# Patient Record
Sex: Male | Born: 1955 | Race: White | Hispanic: No | Smoking: Current every day smoker
Health system: Southern US, Community
[De-identification: ages and names within clinical notes are randomized; demographics above are authoritative.]

## PROBLEM LIST (undated history)

## (undated) DIAGNOSIS — F419 Anxiety disorder, unspecified: Secondary | ICD-10-CM

## (undated) DIAGNOSIS — Z8673 Personal history of transient ischemic attack (TIA), and cerebral infarction without residual deficits: Secondary | ICD-10-CM

## (undated) DIAGNOSIS — F329 Major depressive disorder, single episode, unspecified: Secondary | ICD-10-CM

## (undated) DIAGNOSIS — I2510 Atherosclerotic heart disease of native coronary artery without angina pectoris: Secondary | ICD-10-CM

## (undated) DIAGNOSIS — F259 Schizoaffective disorder, unspecified: Secondary | ICD-10-CM

## (undated) DIAGNOSIS — Z9889 Other specified postprocedural states: Secondary | ICD-10-CM

## (undated) DIAGNOSIS — E119 Type 2 diabetes mellitus without complications: Secondary | ICD-10-CM

## (undated) DIAGNOSIS — I10 Essential (primary) hypertension: Secondary | ICD-10-CM

## (undated) DIAGNOSIS — G40909 Epilepsy, unspecified, not intractable, without status epilepticus: Secondary | ICD-10-CM

## (undated) DIAGNOSIS — I639 Cerebral infarction, unspecified: Secondary | ICD-10-CM

## (undated) DIAGNOSIS — E785 Hyperlipidemia, unspecified: Secondary | ICD-10-CM

## (undated) DIAGNOSIS — G8929 Other chronic pain: Secondary | ICD-10-CM

## (undated) DIAGNOSIS — Z720 Tobacco use: Secondary | ICD-10-CM

## (undated) HISTORY — PX: FEMUR FRACTURE SURGERY: SHX633

## (undated) HISTORY — PX: CARDIAC SURGERY: SHX584

---

## 2014-06-19 ENCOUNTER — Encounter (HOSPITAL_COMMUNITY): Payer: Self-pay | Admitting: Emergency Medicine

## 2014-06-19 ENCOUNTER — Emergency Department (HOSPITAL_COMMUNITY)
Admission: EM | Admit: 2014-06-19 | Discharge: 2014-06-20 | Disposition: A | Discharge status: Home / Self Care | Payer: Medicare Other | Attending: Emergency Medicine | Admitting: Emergency Medicine

## 2014-06-19 ENCOUNTER — Emergency Department (HOSPITAL_COMMUNITY): Payer: Medicare Other

## 2014-06-19 DIAGNOSIS — R519 Headache, unspecified: Secondary | ICD-10-CM

## 2014-06-19 DIAGNOSIS — I2510 Atherosclerotic heart disease of native coronary artery without angina pectoris: Secondary | ICD-10-CM | POA: Diagnosis not present

## 2014-06-19 DIAGNOSIS — I10 Essential (primary) hypertension: Secondary | ICD-10-CM | POA: Insufficient documentation

## 2014-06-19 DIAGNOSIS — R51 Headache: Secondary | ICD-10-CM | POA: Insufficient documentation

## 2014-06-19 DIAGNOSIS — Z79899 Other long term (current) drug therapy: Secondary | ICD-10-CM | POA: Diagnosis not present

## 2014-06-19 DIAGNOSIS — K121 Other forms of stomatitis: Secondary | ICD-10-CM | POA: Insufficient documentation

## 2014-06-19 DIAGNOSIS — Z8673 Personal history of transient ischemic attack (TIA), and cerebral infarction without residual deficits: Secondary | ICD-10-CM | POA: Insufficient documentation

## 2014-06-19 DIAGNOSIS — E119 Type 2 diabetes mellitus without complications: Secondary | ICD-10-CM | POA: Diagnosis not present

## 2014-06-19 DIAGNOSIS — Z9889 Other specified postprocedural states: Secondary | ICD-10-CM | POA: Diagnosis not present

## 2014-06-19 DIAGNOSIS — Z791 Long term (current) use of non-steroidal anti-inflammatories (NSAID): Secondary | ICD-10-CM | POA: Diagnosis not present

## 2014-06-19 DIAGNOSIS — Z720 Tobacco use: Secondary | ICD-10-CM | POA: Insufficient documentation

## 2014-06-19 DIAGNOSIS — Z7982 Long term (current) use of aspirin: Secondary | ICD-10-CM | POA: Insufficient documentation

## 2014-06-19 HISTORY — DX: Cerebral infarction, unspecified: I63.9

## 2014-06-19 HISTORY — DX: Atherosclerotic heart disease of native coronary artery without angina pectoris: I25.10

## 2014-06-19 HISTORY — DX: Essential (primary) hypertension: I10

## 2014-06-19 HISTORY — DX: Other specified postprocedural states: Z98.890

## 2014-06-19 HISTORY — DX: Type 2 diabetes mellitus without complications: E11.9

## 2014-06-19 LAB — URINALYSIS, ROUTINE W REFLEX MICROSCOPIC
Glucose, UA: NEGATIVE mg/dL
HGB URINE DIPSTICK: NEGATIVE
KETONES UR: NEGATIVE mg/dL
Leukocytes, UA: NEGATIVE
Nitrite: NEGATIVE
Protein, ur: NEGATIVE mg/dL
SPECIFIC GRAVITY, URINE: 1.018 (ref 1.005–1.030)
Urobilinogen, UA: 2 mg/dL — ABNORMAL HIGH (ref 0.0–1.0)
pH: 6.5 (ref 5.0–8.0)

## 2014-06-19 MED ORDER — OXYCODONE-ACETAMINOPHEN 5-325 MG PO TABS
2.0000 | ORAL_TABLET | Freq: Once | ORAL | Status: AC
  Administered 2014-06-19: 2 via ORAL
  Filled 2014-06-19: qty 2

## 2014-06-19 NOTE — ED Notes (Signed)
Pt here via EMS from [facility] with c/o headache - "the worst one i have ever had." Pt has history of previous stroke with left facial droop and left sided weakness at baseline. VSS. NAD noted at this time.

## 2014-06-19 NOTE — ED Provider Notes (Signed)
CSN: [id number]     Arrival date & time 06/19/14  1938 History   First MD Initiated Contact with Patient 06/19/14 1940     Chief Complaint  Patient presents with  . Headache     (Consider location/radiation/quality/duration/timing/severity/associated sxs/prior Treatment) HPI The patient ports that he has developed a headache that feels like he has a big pressure right behind his nose and eyes. He reports his pain on the right side of his face. He reports that the headache started this afternoon around lunch time and the pain is much worse and he is experienced with prior headaches. He reports it has both a burning and a pressure quality to it. He reports that he's had bloody mucousy drainage that he thought was from his sinuses. He reports that comes out underneath his denture. He denies any fever or neck stiffness. The patient has a prior history of stroke with a pre-existing left-sided deficit. He denies he's had any new weakness, numbness, tingling or dysfunction. He has not had associated nausea or vomiting. Past Medical History  Diagnosis Date  . Stroke   . Coronary artery disease   . History of cardiac cath   . Diabetes mellitus without complication   . Hypertension    Past Surgical History  Procedure Laterality Date  . Cardiac surgery    . Femur fracture surgery     History reviewed. No pertinent family history. History  Substance Use Topics  . Smoking status: Current Every Day Smoker -- 0.50 packs/day    Types: Cigarettes  . Smokeless tobacco: Never Used  . Alcohol Use: No    Review of Systems 10 Systems reviewed and are negative for acute change except as noted in the HPI.    Allergies  Review of patient's allergies indicates no known allergies.  Home Medications   Prior to Admission medications   Medication Sig Start Date End Date Taking? Authorizing Provider  amLODipine (NORVASC) 5 MG tablet Take 10 mg by mouth daily.   Yes Historical Provider, MD  aspirin 81  MG chewable tablet Chew 81 mg by mouth daily.   Yes Historical Provider, MD  atenolol (TENORMIN) 50 MG tablet Take 50 mg by mouth 2 (two) times daily.   Yes Historical Provider, MD  atorvastatin (LIPITOR) 20 MG tablet Take 20 mg by mouth daily.   Yes Historical Provider, MD  baclofen (LIORESAL) 10 MG tablet Take 10 mg by mouth 3 (three) times daily.   Yes Historical Provider, MD  benazepril (LOTENSIN) 40 MG tablet Take 40 mg by mouth daily.   Yes Historical Provider, MD  diazepam (VALIUM) 5 MG tablet Take 5 mg by mouth 3 (three) times daily.   Yes Historical Provider, MD  diclofenac sodium (VOLTAREN) 1 % GEL Apply 2 g topically 4 (four) times daily. Apply to hip   Yes Historical Provider, MD  DULoxetine (CYMBALTA) 60 MG capsule Take 60 mg by mouth daily.   Yes Historical Provider, MD  etodolac (LODINE) 300 MG capsule Take 300 mg by mouth 3 (three) times daily.   Yes Historical Provider, MD  febuxostat (ULORIC) 40 MG tablet Take 40 mg by mouth daily.   Yes Historical Provider, MD  fenofibrate 160 MG tablet Take 160 mg by mouth daily.   Yes Historical Provider, MD  furosemide (LASIX) 20 MG tablet Take 20 mg by mouth daily.   Yes Historical Provider, MD  gabapentin (NEURONTIN) 800 MG tablet Take 800 mg by mouth 3 (three) times daily.   Yes Historical  Provider, MD  HYDROcodone-acetaminophen (NORCO/VICODIN) 5-325 MG per tablet Take 1 tablet by mouth every 6 (six) hours as needed for moderate pain.   Yes Historical Provider, MD  hydrOXYzine (ATARAX/VISTARIL) 50 MG tablet Take 50 mg by mouth every 6 (six) hours as needed for anxiety.   Yes Historical Provider, MD  lamoTRIgine (LAMICTAL) 25 MG tablet Take 25 mg by mouth every other day.   Yes Historical Provider, MD  levETIRAcetam (KEPPRA) 500 MG tablet Take 1,000 mg by mouth 2 (two) times daily.   Yes Historical Provider, MD  lidocaine (LIDODERM) 5 % Place 1 patch onto the skin daily. Remove & Discard patch within 12 hours or as directed by MD   Yes  Historical Provider, MD  loperamide (IMODIUM A-D) 2 MG tablet Take 2 mg by mouth 4 (four) times daily as needed for diarrhea or loose stools.   Yes Historical Provider, MD  metFORMIN (GLUCOPHAGE) 1000 MG tablet Take 1,000 mg by mouth 2 (two) times daily with a meal.   Yes Historical Provider, MD  nicotine (NICODERM CQ - DOSED IN MG/24 HOURS) 21 mg/24hr patch Place 21 mg onto the skin daily.   Yes Historical Provider, MD  nitroGLYCERIN (NITROSTAT) 0.4 MG SL tablet Place 0.4 mg under the tongue every 5 (five) minutes as needed for chest pain.   Yes Historical Provider, MD  omeprazole (PRILOSEC) 20 MG capsule Take 20 mg by mouth daily.   Yes Historical Provider, MD  polyethylene glycol (MIRALAX / GLYCOLAX) packet Take 17 g by mouth daily.   Yes Historical Provider, MD  QUEtiapine (SEROQUEL) 25 MG tablet Take 25 mg by mouth at bedtime.   Yes Historical Provider, MD  oxyCODONE-acetaminophen (PERCOCET/ROXICET) 5-325 MG per tablet Take 2 tablets by mouth every 6 (six) hours as needed for severe pain. 06/20/14   Arby Barrette, MD   BP 145/89 mmHg  Pulse 68  Resp 17  Ht  (1.727 m)  Wt 250 lb (113.399 kg)  BMI 38.02 kg/m2  SpO2 88% Physical Exam  Constitutional: He is oriented to person, place, and time. He appears well-developed and well-nourished.  The patient is sitting on the edge of the stretcher. His general appearance is well. He is alert and shows no respiratory distress. His color is good and he is giving full history.  HENT:  Head: Normocephalic and atraumatic.  Right Ear: External ear normal.  Left Ear: External ear normal.  Nose: Nose normal.  Mouth/Throat: Oropharynx is clear and moist.  Patient did remove his dentures and has an erosion in the pocket of the upper lip just beneath his nose. This is approximately 4 x 3 mm. There is no active drainage or discharge. It does not have a depth of more than several millimeters. I don't see any indication that this has any fistular  connection with his nasal cavity.  Eyes: EOM are normal. Pupils are equal, round, and reactive to light.  Neck: Neck supple.  Cardiovascular: Normal rate, regular rhythm, normal heart sounds and intact distal pulses.   Pulmonary/Chest: Effort normal and breath sounds normal.  Abdominal: Soft. Bowel sounds are normal. He exhibits no distension. There is no tenderness.  Musculoskeletal: Normal range of motion. He exhibits no edema.  Neurological: He is alert and oriented to person, place, and time. He has normal strength. GCS eye subscore is 4. GCS verbal subscore is 5. GCS motor subscore is 6.  At baseline the patient is wheel chair bound for left lower extremity dysfunction. There is some  left facial droop however the patient has good speech production, no drooling or difficulty handling secretions. He follows commands appropriately and is able to reposition himself from a seated position the stretcher to supine. He reports all of this is at his baseline.  Skin: Skin is warm, dry and intact.  Psychiatric: He has a normal mood and affect.    ED Course  Procedures (including critical care time) Labs Review Labs Reviewed  URINALYSIS, ROUTINE W REFLEX MICROSCOPIC - Abnormal; Notable for the following:    Bilirubin Urine LARGE (*)    Urobilinogen, UA 2.0 (*)    All other components within normal limits    Imaging Review Ct Head Wo Contrast  06/19/2014   CLINICAL DATA:  Severe right-sided headache worsening throughout the day.  EXAM: CT HEAD WITHOUT CONTRAST  TECHNIQUE: Contiguous axial images were obtained from the base of the skull through the vertex without intravenous contrast.  COMPARISON:  None.  FINDINGS: Encephalomalacia in the high parasagittal right frontal lobe is consistent with a chronic ACA territory infarct. There is ex vacuo dilatation of the right lateral ventricle. Periventricular white-matter hypodensities are nonspecific but compatible with mild chronic small vessel ischemic  disease. There is no evidence of acute cortical infarct, intracranial hemorrhage, mass, midline shift, or extra-axial fluid collection.  Orbits are unremarkable. Mastoid air cells and paranasal sinuses are clear. Mild carotid siphon calcification is noted.  IMPRESSION: 1. No evidence of acute intracranial abnormality. 2. 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   Electronically Signed   By: Sebastian AcheAllen  Grady   On: 06/19/2014 21:13     EKG Interpretation None     With the patient's report of his worst ever headache and headache being right side behind his eye and nose, I had concern for possible aneurysm and felt that MRI/MRA was indicated for the patient. Upon reassessment the patient felt that his headache was improved and that his pain was due to this ulcer area in his gum and a infection in his sinus. He did not feel that he needed a MRI and requested antibiotics and pain medications. I explained to the patient that based on my physical exam findings I do not see indication of acute infection. The area in question in his oral cavity is a shallow self-contained ulcer consistent with chronic rubbing from his denture plate. There is no extending erythema or swelling about the area. There is no abnormal examination findings within the nasal passage, the remainder of the oral cavity nor any associated facial swelling. The patient however did not feel that a MRI was needed and explained that he would prefer to do a follow-up with his family physician and subsequently [facility]. MDM   Final diagnoses:  Acute nonintractable headache, unspecified headache type  Oral ulcer   See above note. Patient has not had any change in his baseline neurologic function. He is alert and interactive. His cognitive function is intact with good recall and insight into his current situation.    Arby BarretteMarcy Avant Printy, MD 06/20/14 [phone]

## 2014-06-20 ENCOUNTER — Telehealth: Payer: Self-pay | Admitting: *Deleted

## 2014-06-20 MED ORDER — OXYCODONE-ACETAMINOPHEN 5-325 MG PO TABS: 2.0000 | ORAL_TABLET | Freq: Four times a day (QID) | ORAL | Status: DC | PRN

## 2014-06-20 NOTE — Discharge Instructions (Signed)
°  General Headache Without Cause A headache is pain or discomfort felt around the head or neck area. The specific cause of a headache may not be found. There are many causes and types of headaches. A few common ones are:  Tension headaches.  Migraine headaches.  Cluster headaches.  Chronic daily headaches. HOME CARE INSTRUCTIONS   Keep all follow-up appointments with your caregiver or any specialist referral.  Only take over-the-counter or prescription medicines for pain or discomfort as directed by your caregiver.  Lie down in a dark, quiet room when you have a headache.  Keep a headache journal to find out what may trigger your migraine headaches. For example, write down:  What you eat and drink.  How much sleep you get.  Any change to your diet or medicines.  Try massage or other relaxation techniques.  Put ice packs or heat on the head and neck. Use these 3 to 4 times per day for 15 to 20 minutes each time, or as needed.  Limit stress.  Sit up straight, and do not tense your muscles.  Quit smoking if you smoke.  Limit alcohol use.  Decrease the amount of caffeine you drink, or stop drinking caffeine.  Eat and sleep on a regular schedule.  Get 7 to 9 hours of sleep, or as recommended by your caregiver.  Keep lights dim if bright lights bother you and make your headaches worse. SEEK MEDICAL CARE IF:   You have problems with the medicines you were prescribed.  Your medicines are not working.  You have a change from the usual headache.  You have nausea or vomiting. SEEK IMMEDIATE MEDICAL CARE IF:   Your headache becomes severe.  You have a fever.  You have a stiff neck.  You have loss of vision.  You have muscular weakness or loss of muscle control.  You start losing your balance or have trouble walking.  You feel faint or pass out.  You have severe symptoms that are different from your first symptoms. MAKE SURE YOU:   Understand these  instructions.  Will watch your condition.  Will get help right away if you are not doing well or get worse. Document Released: 04/13/2005 Document Revised: 07/06/2011 Document Reviewed: 04/29/2011 [facility] Patient Information 2015 [employer] [place]. This information is not intended to replace advice given to you by your health care provider. Make sure you discuss any questions you have with your health care provider.  You have an ulcer under your denture plate. See your dentist as soon as possible for a refitting. Remove her dentures as much as possible to allow the area to heal.

## 2014-06-20 NOTE — ED Notes (Signed)
[facility] contacted [facility]

## 2014-06-20 NOTE — Telephone Encounter (Signed)
Pharmacy called stating that pt had Norco prescription filled on the 8th and has refills.  NCM advised not to fill      oxyCODONE-acetaminophen (PERCOCET/ROXICET) 5-325 MG per tablet     As prescribed by us.

## 2014-08-08 ENCOUNTER — Inpatient Hospital Stay (HOSPITAL_COMMUNITY): Payer: Medicare Other

## 2014-08-08 ENCOUNTER — Emergency Department (HOSPITAL_COMMUNITY): Payer: Medicare Other

## 2014-08-08 ENCOUNTER — Inpatient Hospital Stay (HOSPITAL_COMMUNITY)
Admission: EM | Admit: 2014-08-08 | Discharge: 2014-08-13 | DRG: 091 | Disposition: A | Discharge status: Skilled Nursing Facility | Payer: Medicare Other | Attending: Internal Medicine | Admitting: Internal Medicine

## 2014-08-08 ENCOUNTER — Encounter (HOSPITAL_COMMUNITY): Payer: Self-pay

## 2014-08-08 DIAGNOSIS — I959 Hypotension, unspecified: Secondary | ICD-10-CM | POA: Diagnosis present

## 2014-08-08 DIAGNOSIS — T40605A Adverse effect of unspecified narcotics, initial encounter: Secondary | ICD-10-CM | POA: Diagnosis present

## 2014-08-08 DIAGNOSIS — K219 Gastro-esophageal reflux disease without esophagitis: Secondary | ICD-10-CM | POA: Diagnosis present

## 2014-08-08 DIAGNOSIS — Z4659 Encounter for fitting and adjustment of other gastrointestinal appliance and device: Secondary | ICD-10-CM

## 2014-08-08 DIAGNOSIS — F17210 Nicotine dependence, cigarettes, uncomplicated: Secondary | ICD-10-CM | POA: Diagnosis present

## 2014-08-08 DIAGNOSIS — E119 Type 2 diabetes mellitus without complications: Secondary | ICD-10-CM | POA: Diagnosis present

## 2014-08-08 DIAGNOSIS — J9601 Acute respiratory failure with hypoxia: Secondary | ICD-10-CM | POA: Diagnosis present

## 2014-08-08 DIAGNOSIS — G894 Chronic pain syndrome: Secondary | ICD-10-CM | POA: Diagnosis present

## 2014-08-08 DIAGNOSIS — I639 Cerebral infarction, unspecified: Secondary | ICD-10-CM

## 2014-08-08 DIAGNOSIS — K7200 Acute and subacute hepatic failure without coma: Secondary | ICD-10-CM | POA: Diagnosis present

## 2014-08-08 DIAGNOSIS — I10 Essential (primary) hypertension: Secondary | ICD-10-CM | POA: Diagnosis present

## 2014-08-08 DIAGNOSIS — G40909 Epilepsy, unspecified, not intractable, without status epilepticus: Secondary | ICD-10-CM | POA: Diagnosis present

## 2014-08-08 DIAGNOSIS — E785 Hyperlipidemia, unspecified: Secondary | ICD-10-CM | POA: Diagnosis present

## 2014-08-08 DIAGNOSIS — Z7982 Long term (current) use of aspirin: Secondary | ICD-10-CM

## 2014-08-08 DIAGNOSIS — T4275XA Adverse effect of unspecified antiepileptic and sedative-hypnotic drugs, initial encounter: Secondary | ICD-10-CM | POA: Diagnosis present

## 2014-08-08 DIAGNOSIS — J9602 Acute respiratory failure with hypercapnia: Secondary | ICD-10-CM | POA: Diagnosis present

## 2014-08-08 DIAGNOSIS — N289 Disorder of kidney and ureter, unspecified: Secondary | ICD-10-CM | POA: Diagnosis present

## 2014-08-08 DIAGNOSIS — Z79899 Other long term (current) drug therapy: Secondary | ICD-10-CM

## 2014-08-08 DIAGNOSIS — I2510 Atherosclerotic heart disease of native coronary artery without angina pectoris: Secondary | ICD-10-CM | POA: Diagnosis present

## 2014-08-08 DIAGNOSIS — Z6836 Body mass index (BMI) 36.0-36.9, adult: Secondary | ICD-10-CM | POA: Diagnosis not present

## 2014-08-08 DIAGNOSIS — J9690 Respiratory failure, unspecified, unspecified whether with hypoxia or hypercapnia: Secondary | ICD-10-CM

## 2014-08-08 DIAGNOSIS — R401 Stupor: Secondary | ICD-10-CM | POA: Diagnosis not present

## 2014-08-08 DIAGNOSIS — R531 Weakness: Secondary | ICD-10-CM

## 2014-08-08 DIAGNOSIS — J9600 Acute respiratory failure, unspecified whether with hypoxia or hypercapnia: Secondary | ICD-10-CM

## 2014-08-08 DIAGNOSIS — R410 Disorientation, unspecified: Secondary | ICD-10-CM | POA: Diagnosis not present

## 2014-08-08 DIAGNOSIS — G9340 Encephalopathy, unspecified: Secondary | ICD-10-CM | POA: Diagnosis not present

## 2014-08-08 DIAGNOSIS — I69354 Hemiplegia and hemiparesis following cerebral infarction affecting left non-dominant side: Secondary | ICD-10-CM | POA: Diagnosis not present

## 2014-08-08 DIAGNOSIS — G92 Toxic encephalopathy: Principal | ICD-10-CM | POA: Diagnosis present

## 2014-08-08 DIAGNOSIS — I6789 Other cerebrovascular disease: Secondary | ICD-10-CM | POA: Diagnosis not present

## 2014-08-08 DIAGNOSIS — R4182 Altered mental status, unspecified: Secondary | ICD-10-CM | POA: Diagnosis present

## 2014-08-08 DIAGNOSIS — G8190 Hemiplegia, unspecified affecting unspecified side: Secondary | ICD-10-CM

## 2014-08-08 DIAGNOSIS — E872 Acidosis: Secondary | ICD-10-CM | POA: Diagnosis present

## 2014-08-08 DIAGNOSIS — G8194 Hemiplegia, unspecified affecting left nondominant side: Secondary | ICD-10-CM

## 2014-08-08 LAB — I-STAT CHEM 8, ED
BUN: 38 mg/dL — ABNORMAL HIGH (ref 6–23)
CHLORIDE: 98 mmol/L (ref 96–112)
Calcium, Ion: 1.2 mmol/L (ref 1.12–1.23)
Creatinine, Ser: 1.7 mg/dL — ABNORMAL HIGH (ref 0.50–1.35)
GLUCOSE: 112 mg/dL — AB (ref 70–99)
HCT: 44 % (ref 39.0–52.0)
Hemoglobin: 15 g/dL (ref 13.0–17.0)
Potassium: 4.8 mmol/L (ref 3.5–5.1)
Sodium: 135 mmol/L (ref 135–145)
TCO2: 24 mmol/L (ref 0–100)

## 2014-08-08 LAB — URINALYSIS, ROUTINE W REFLEX MICROSCOPIC
Glucose, UA: NEGATIVE mg/dL
HGB URINE DIPSTICK: NEGATIVE
Ketones, ur: NEGATIVE mg/dL
Leukocytes, UA: NEGATIVE
NITRITE: NEGATIVE
PH: 5 (ref 5.0–8.0)
Protein, ur: NEGATIVE mg/dL
Specific Gravity, Urine: 1.015 (ref 1.005–1.030)
Urobilinogen, UA: 0.2 mg/dL (ref 0.0–1.0)

## 2014-08-08 LAB — I-STAT ARTERIAL BLOOD GAS, ED
ACID-BASE DEFICIT: 3 mmol/L — AB (ref 0.0–2.0)
ACID-BASE EXCESS: 1 mmol/L (ref 0.0–2.0)
BICARBONATE: 27.3 meq/L — AB (ref 20.0–24.0)
Bicarbonate: 27.8 mEq/L — ABNORMAL HIGH (ref 20.0–24.0)
O2 SAT: 96 %
O2 Saturation: 100 %
Patient temperature: 98.6
TCO2: 29 mmol/L (ref 0–100)
TCO2: 29 mmol/L (ref 0–100)
pCO2 arterial: 69.6 mmHg (ref 35.0–45.0)
pCO2 arterial: 53.4 mmHg — ABNORMAL HIGH (ref 35.0–45.0)
pH, Arterial: 7.2 — ABNORMAL LOW (ref 7.350–7.450)
pH, Arterial: 7.324 — ABNORMAL LOW (ref 7.350–7.450)
pO2, Arterial: 105 mmHg — ABNORMAL HIGH (ref 80.0–100.0)
pO2, Arterial: 351 mmHg — ABNORMAL HIGH (ref 80.0–100.0)

## 2014-08-08 LAB — COMPREHENSIVE METABOLIC PANEL
ALT: 62 U/L — ABNORMAL HIGH (ref 0–53)
AST: 84 U/L — AB (ref 0–37)
Albumin: 3.9 g/dL (ref 3.5–5.2)
Alkaline Phosphatase: 22 U/L — ABNORMAL LOW (ref 39–117)
Anion gap: 13 (ref 5–15)
BUN: 30 mg/dL — ABNORMAL HIGH (ref 6–23)
CALCIUM: 9.6 mg/dL (ref 8.4–10.5)
CHLORIDE: 97 mmol/L (ref 96–112)
CO2: 24 mmol/L (ref 19–32)
Creatinine, Ser: 1.7 mg/dL — ABNORMAL HIGH (ref 0.50–1.35)
GFR calc Af Amer: 50 mL/min — ABNORMAL LOW (ref >90)
GFR calc non Af Amer: 43 mL/min — ABNORMAL LOW (ref >90)
Glucose, Bld: 114 mg/dL — ABNORMAL HIGH (ref 70–99)
Potassium: 4.9 mmol/L (ref 3.5–5.1)
SODIUM: 134 mmol/L — AB (ref 135–145)
TOTAL PROTEIN: 7 g/dL (ref 6.0–8.3)
Total Bilirubin: 0.8 mg/dL (ref 0.3–1.2)

## 2014-08-08 LAB — CBC
HEMATOCRIT: 39.6 % (ref 39.0–52.0)
HEMOGLOBIN: 13 g/dL (ref 13.0–17.0)
MCH: 28.3 pg (ref 26.0–34.0)
MCHC: 32.8 g/dL (ref 30.0–36.0)
MCV: 86.3 fL (ref 78.0–100.0)
Platelets: 247 10*3/uL (ref 150–400)
RBC: 4.59 MIL/uL (ref 4.22–5.81)
RDW: 13.2 % (ref 11.5–15.5)
WBC: 7.2 10*3/uL (ref 4.0–10.5)

## 2014-08-08 LAB — I-STAT TROPONIN, ED: Troponin i, poc: 0.01 ng/mL (ref 0.00–0.08)

## 2014-08-08 LAB — ETHANOL

## 2014-08-08 LAB — DIFFERENTIAL
BASOS PCT: 0 % (ref 0–1)
Basophils Absolute: 0 10*3/uL (ref 0.0–0.1)
EOS ABS: 0.1 10*3/uL (ref 0.0–0.7)
EOS PCT: 1 % (ref 0–5)
Lymphocytes Relative: 39 % (ref 12–46)
Lymphs Abs: 2.8 10*3/uL (ref 0.7–4.0)
MONOS PCT: 9 % (ref 3–12)
Monocytes Absolute: 0.7 10*3/uL (ref 0.1–1.0)
NEUTROS PCT: 51 % (ref 43–77)
Neutro Abs: 3.6 10*3/uL (ref 1.7–7.7)

## 2014-08-08 LAB — CBG MONITORING, ED: Glucose-Capillary: 114 mg/dL — ABNORMAL HIGH (ref 70–99)

## 2014-08-08 LAB — RAPID URINE DRUG SCREEN, HOSP PERFORMED
AMPHETAMINES: NOT DETECTED
BENZODIAZEPINES: POSITIVE — AB
Barbiturates: NOT DETECTED
COCAINE: NOT DETECTED
OPIATES: POSITIVE — AB
TETRAHYDROCANNABINOL: POSITIVE — AB

## 2014-08-08 LAB — PROTIME-INR
INR: 1.19 (ref 0.00–1.49)
Prothrombin Time: 15.2 seconds (ref 11.6–15.2)

## 2014-08-08 LAB — APTT: aPTT: 30 seconds (ref 24–37)

## 2014-08-08 MED ORDER — FAMOTIDINE IN NACL 20-0.9 MG/50ML-% IV SOLN
20.0000 mg | Freq: Two times a day (BID) | INTRAVENOUS | Status: DC
  Administered 2014-08-09 – 2014-08-11 (×7): 20 mg via INTRAVENOUS
  Filled 2014-08-08 (×9): qty 50

## 2014-08-08 MED ORDER — NALOXONE HCL 0.4 MG/ML IJ SOLN
0.4000 mg | Freq: Once | INTRAMUSCULAR | Status: AC
  Administered 2014-08-08: 0.4 mg via INTRAVENOUS

## 2014-08-08 MED ORDER — NALOXONE HCL 0.4 MG/ML IJ SOLN
INTRAMUSCULAR | Status: AC
  Filled 2014-08-08: qty 1

## 2014-08-08 MED ORDER — INSULIN ASPART 100 UNIT/ML ~~LOC~~ SOLN
0.0000 [IU] | SUBCUTANEOUS | Status: DC
  Administered 2014-08-09 – 2014-08-10 (×3): 2 [IU] via SUBCUTANEOUS

## 2014-08-08 MED ORDER — DEXTROSE-NACL 5-0.45 % IV SOLN
INTRAVENOUS | Status: DC
  Administered 2014-08-09 – 2014-08-11 (×4): via INTRAVENOUS

## 2014-08-08 MED ORDER — SUCCINYLCHOLINE CHLORIDE 20 MG/ML IJ SOLN
INTRAMUSCULAR | Status: DC | PRN
  Administered 2014-08-08: 150 mg via INTRAVENOUS

## 2014-08-08 MED ORDER — LIDOCAINE HCL (CARDIAC) 20 MG/ML IV SOLN
INTRAVENOUS | Status: AC
  Filled 2014-08-08: qty 5

## 2014-08-08 MED ORDER — ETOMIDATE 2 MG/ML IV SOLN
INTRAVENOUS | Status: AC
  Filled 2014-08-08: qty 20

## 2014-08-08 MED ORDER — IOHEXOL 350 MG/ML SOLN: 70.0000 mL | Freq: Once | INTRAVENOUS | Status: DC | PRN

## 2014-08-08 MED ORDER — ASPIRIN 325 MG PO TABS
325.0000 mg | ORAL_TABLET | Freq: Every day | ORAL | Status: DC
  Administered 2014-08-09 – 2014-08-12 (×4): 325 mg
  Filled 2014-08-08 (×5): qty 1

## 2014-08-08 MED ORDER — SUCCINYLCHOLINE CHLORIDE 20 MG/ML IJ SOLN
150.0000 mg | Freq: Once | INTRAMUSCULAR | Status: DC
  Filled 2014-08-08: qty 7.5

## 2014-08-08 MED ORDER — DIAZEPAM 5 MG PO TABS
5.0000 mg | ORAL_TABLET | Freq: Three times a day (TID) | ORAL | Status: DC
  Administered 2014-08-09 – 2014-08-13 (×13): 5 mg via ORAL
  Filled 2014-08-08 (×13): qty 1

## 2014-08-08 MED ORDER — GABAPENTIN 400 MG PO CAPS
400.0000 mg | ORAL_CAPSULE | Freq: Two times a day (BID) | ORAL | Status: DC
  Administered 2014-08-09 (×3): 400 mg
  Filled 2014-08-08 (×5): qty 1

## 2014-08-08 MED ORDER — SUCCINYLCHOLINE CHLORIDE 20 MG/ML IJ SOLN
INTRAMUSCULAR | Status: AC
  Filled 2014-08-08: qty 1

## 2014-08-08 MED ORDER — ETOMIDATE 2 MG/ML IV SOLN: 35.0000 mg | Freq: Once | INTRAVENOUS | Status: DC

## 2014-08-08 MED ORDER — LAMOTRIGINE 25 MG PO TABS
25.0000 mg | ORAL_TABLET | Freq: Every day | ORAL | Status: DC
  Administered 2014-08-09 – 2014-08-12 (×4): 25 mg
  Filled 2014-08-08 (×4): qty 1

## 2014-08-08 MED ORDER — ATORVASTATIN CALCIUM 20 MG PO TABS
20.0000 mg | ORAL_TABLET | Freq: Every day | ORAL | Status: DC
  Administered 2014-08-10 – 2014-08-11 (×2): 20 mg
  Filled 2014-08-08 (×4): qty 1

## 2014-08-08 MED ORDER — POLYETHYLENE GLYCOL 3350 17 G PO PACK
17.0000 g | PACK | Freq: Every day | ORAL | Status: DC | PRN
  Filled 2014-08-08: qty 1

## 2014-08-08 MED ORDER — PROPOFOL 10 MG/ML IV EMUL
INTRAVENOUS | Status: AC
  Filled 2014-08-08: qty 100

## 2014-08-08 MED ORDER — METOPROLOL TARTRATE 1 MG/ML IV SOLN: 2.5000 mg | INTRAVENOUS | Status: DC | PRN

## 2014-08-08 MED ORDER — ROCURONIUM BROMIDE 50 MG/5ML IV SOLN
INTRAVENOUS | Status: AC
  Filled 2014-08-08: qty 2

## 2014-08-08 MED ORDER — FENTANYL CITRATE 0.05 MG/ML IJ SOLN
25.0000 ug | INTRAMUSCULAR | Status: DC | PRN
Start: 2014-08-08 — End: 2014-08-09
  Filled 2014-08-08 (×4): qty 2

## 2014-08-08 MED ORDER — ETOMIDATE 2 MG/ML IV SOLN
INTRAVENOUS | Status: DC | PRN
  Administered 2014-08-08: 35 mg via INTRAVENOUS

## 2014-08-08 MED ORDER — PANTOPRAZOLE SODIUM 40 MG IV SOLR
40.0000 mg | Freq: Every day | INTRAVENOUS | Status: DC
  Administered 2014-08-09 – 2014-08-11 (×4): 40 mg via INTRAVENOUS
  Filled 2014-08-08 (×5): qty 40

## 2014-08-08 MED ORDER — HEPARIN SODIUM (PORCINE) 5000 UNIT/ML IJ SOLN
5000.0000 [IU] | Freq: Three times a day (TID) | INTRAMUSCULAR | Status: DC
  Administered 2014-08-09 – 2014-08-13 (×15): 5000 [IU] via SUBCUTANEOUS
  Filled 2014-08-08 (×18): qty 1

## 2014-08-08 MED ORDER — PROPOFOL 10 MG/ML IV EMUL
5.0000 ug/kg/min | Freq: Once | INTRAVENOUS | Status: AC
  Administered 2014-08-08: 20 ug/kg/min via INTRAVENOUS

## 2014-08-08 MED ORDER — HYDRALAZINE HCL 20 MG/ML IJ SOLN: 10.0000 mg | INTRAMUSCULAR | Status: DC | PRN

## 2014-08-08 MED ORDER — PROPOFOL 10 MG/ML IV EMUL
5.0000 ug/kg/min | INTRAVENOUS | Status: DC
  Administered 2014-08-08 – 2014-08-09 (×2): 15 ug/kg/min via INTRAVENOUS
  Filled 2014-08-08: qty 100

## 2014-08-08 MED ORDER — IPRATROPIUM-ALBUTEROL 0.5-2.5 (3) MG/3ML IN SOLN
3.0000 mL | Freq: Four times a day (QID) | RESPIRATORY_TRACT | Status: DC
  Administered 2014-08-09 – 2014-08-10 (×5): 3 mL via RESPIRATORY_TRACT
  Filled 2014-08-08 (×6): qty 3

## 2014-08-08 MED ORDER — LEVETIRACETAM 100 MG/ML PO SOLN
1000.0000 mg | Freq: Two times a day (BID) | ORAL | Status: DC
  Administered 2014-08-09 (×3): 1000 mg
  Filled 2014-08-08 (×5): qty 10

## 2014-08-08 NOTE — ED Notes (Signed)
To CT at this time.

## 2014-08-08 NOTE — ED Notes (Signed)
Upper dentures removed and placed in denture cup and taken to pt's room with pt

## 2014-08-08 NOTE — ED Notes (Signed)
Resp. Called to assist in transporting pt to CT and 57M

## 2014-08-08 NOTE — ED Provider Notes (Signed)
CSN: [id number]     Arrival date & time 08/08/14  1859 History   First MD Initiated Contact with Patient 08/08/14 1908     Chief Complaint  Patient presents with  . Code Stroke     (Consider location/radiation/quality/duration/timing/severity/associated sxs/prior Treatment) Patient is a 59 y.o. male presenting with Acute Neurological Problem. The history is provided by the EMS personnel. The history is limited by the condition of the patient. No language interpreter was used.  Cerebrovascular Accident This is a new problem. The current episode started today. The problem has been unchanged. Associated symptoms include fatigue, headaches and weakness. Pertinent negatives include no abdominal pain, chest pain, coughing, fever, numbness or vomiting. Nothing aggravates the symptoms. He has tried nothing for the symptoms.    Past Medical History  Diagnosis Date  . Stroke   . Coronary artery disease   . History of cardiac cath   . Diabetes mellitus without complication   . Hypertension    Past Surgical History  Procedure Laterality Date  . Cardiac surgery    . Femur fracture surgery     No family history on file. History  Substance Use Topics  . Smoking status: Current Every Day Smoker -- 0.50 packs/day    Types: Cigarettes  . Smokeless tobacco: Never Used  . Alcohol Use: No    Review of Systems  Constitutional: Positive for fatigue. Negative for fever.  Respiratory: Negative for cough and shortness of breath.   Cardiovascular: Negative for chest pain.  Gastrointestinal: Negative for vomiting and abdominal pain.  Neurological: Positive for weakness and headaches. Negative for facial asymmetry, speech difficulty, light-headedness and numbness.  All other systems reviewed and are negative.     Allergies  Review of patient's allergies indicates no known allergies.  Home Medications   Prior to Admission medications   Medication Sig Start Date End Date Taking? Authorizing  Provider  amLODipine (NORVASC) 5 MG tablet Take 10 mg by mouth daily.    Historical Provider, MD  aspirin 81 MG chewable tablet Chew 81 mg by mouth daily.    Historical Provider, MD  atenolol (TENORMIN) 50 MG tablet Take 50 mg by mouth 2 (two) times daily.    Historical Provider, MD  atorvastatin (LIPITOR) 20 MG tablet Take 20 mg by mouth daily.    Historical Provider, MD  baclofen (LIORESAL) 10 MG tablet Take 10 mg by mouth 3 (three) times daily.    Historical Provider, MD  benazepril (LOTENSIN) 40 MG tablet Take 40 mg by mouth daily.    Historical Provider, MD  diazepam (VALIUM) 5 MG tablet Take 5 mg by mouth 3 (three) times daily.    Historical Provider, MD  diclofenac sodium (VOLTAREN) 1 % GEL Apply 2 g topically 4 (four) times daily. Apply to hip    Historical Provider, MD  DULoxetine (CYMBALTA) 60 MG capsule Take 60 mg by mouth daily.    Historical Provider, MD  etodolac (LODINE) 300 MG capsule Take 300 mg by mouth 3 (three) times daily.    Historical Provider, MD  febuxostat (ULORIC) 40 MG tablet Take 40 mg by mouth daily.    Historical Provider, MD  fenofibrate 160 MG tablet Take 160 mg by mouth daily.    Historical Provider, MD  furosemide (LASIX) 20 MG tablet Take 20 mg by mouth daily.    Historical Provider, MD  gabapentin (NEURONTIN) 800 MG tablet Take 800 mg by mouth 3 (three) times daily.    Historical Provider, MD  HYDROcodone-acetaminophen (NORCO/VICODIN) 5-325  MG per tablet Take 1 tablet by mouth every 6 (six) hours as needed for moderate pain.    Historical Provider, MD  hydrOXYzine (ATARAX/VISTARIL) 50 MG tablet Take 50 mg by mouth every 6 (six) hours as needed for anxiety.    Historical Provider, MD  lamoTRIgine (LAMICTAL) 25 MG tablet Take 25 mg by mouth every other day.    Historical Provider, MD  levETIRAcetam (KEPPRA) 500 MG tablet Take 1,000 mg by mouth 2 (two) times daily.    Historical Provider, MD  lidocaine (LIDODERM) 5 % Place 1 patch onto the skin daily. Remove &  Discard patch within 12 hours or as directed by MD    Historical Provider, MD  loperamide (IMODIUM A-D) 2 MG tablet Take 2 mg by mouth 4 (four) times daily as needed for diarrhea or loose stools.    Historical Provider, MD  metFORMIN (GLUCOPHAGE) 1000 MG tablet Take 1,000 mg by mouth 2 (two) times daily with a meal.    Historical Provider, MD  nicotine (NICODERM CQ - DOSED IN MG/24 HOURS) 21 mg/24hr patch Place 21 mg onto the skin daily.    Historical Provider, MD  nitroGLYCERIN (NITROSTAT) 0.4 MG SL tablet Place 0.4 mg under the tongue every 5 (five) minutes as needed for chest pain.    Historical Provider, MD  omeprazole (PRILOSEC) 20 MG capsule Take 20 mg by mouth daily.    Historical Provider, MD  oxyCODONE-acetaminophen (PERCOCET/ROXICET) 5-325 MG per tablet Take 2 tablets by mouth every 6 (six) hours as needed for severe pain. 06/20/14   Arby Barrette, MD  polyethylene glycol (MIRALAX / GLYCOLAX) packet Take 17 g by mouth daily.    Historical Provider, MD  QUEtiapine (SEROQUEL) 25 MG tablet Take 25 mg by mouth at bedtime.    Historical Provider, MD   Pulse 54  Temp(Src) 98 F (36.7 C) (Oral)  Resp 12  SpO2 97%  ED Triage Vitals  Enc Vitals Group     BP 08/08/14 1931 118/67 mmHg     Pulse Rate 08/08/14 1923 58     Resp 08/08/14 1923 10     Temp 08/08/14 1927 98 F (36.7 C)     Temp Source 08/08/14 1927 Oral     SpO2 08/08/14 1923 88 %     Weight 08/08/14 2244 249 lb 1.9 oz (113 kg)     Height --      Head Cir --      Peak Flow --      Pain Score 08/08/14 1938 2     Pain Loc --      Pain Edu? --      Excl. in GC? --     Physical Exam  Constitutional: He appears well-developed and well-nourished. He appears lethargic. He is easily aroused. He has a sickly appearance. No distress. Nasal cannula in place.  HENT:  Head: Normocephalic and atraumatic.  Nose: Nose normal.  Mouth/Throat: Oropharynx is clear and moist. No oropharyngeal exudate.  Eyes: EOM are normal. Pupils are  equal, round, and reactive to light.  Neck: Normal range of motion. Neck supple.  Cardiovascular: Regular rhythm, normal heart sounds and intact distal pulses.  Bradycardia present.   No murmur heard. Pulmonary/Chest: Effort normal and breath sounds normal. No respiratory distress. He has no wheezes. He exhibits no tenderness.  Abdominal: Soft. He exhibits no distension. There is no tenderness. There is no guarding.  Musculoskeletal: Normal range of motion. He exhibits no tenderness.  Neurological: He is easily aroused.  He appears lethargic. No cranial nerve deficit. Coordination normal.  Fatigued but easily arousable to verbal stimuli.  No facial droop.  Mild dysarthria but understandable.  EOMI.  Pupils equal and reactive.   Decreased strength in left upper < lower extremities.  0/5 in lower extremity, does not react to painful stimuli of that extremity.  Full strength of bilateral right sided extremities.    Skin: Skin is warm and dry. He is not diaphoretic. No pallor.  Nursing note and vitals reviewed.   ED Course  INTUBATION Date/Time: 08/08/2014 8:30 PM Performed by: Lenell AntuWRIGHT, Jasman Murri Authorized by: Lenell AntuWRIGHT, Aurianna Earlywine Consent: The procedure was performed in an emergent situation. Required items: required blood products, implants, devices, and special equipment available Patient identity confirmed: arm band Time out: Immediately prior to procedure a "time out" was called to verify the correct patient, procedure, equipment, support staff and site/side marked as required. Indications: airway protection and  respiratory distress Intubation method: video-assisted (initially attempted DL with a Mac 3, then changed to VL with glidescope) Patient status: paralyzed (RSI) Preoxygenation: BVM (and O2 by [place]) Sedatives: etomidate Paralytic: succinylcholine Laryngoscope size: glidescope size 3. Tube size: 7.5 mm Tube type: cuffed Number of attempts: 2 Ventilation between attempts: BVM Cricoid  pressure: no Cords visualized: yes Post-procedure assessment: chest rise and CO2 detector Breath sounds: equal and absent over the epigastrium Cuff inflated: yes ETT to lip: 23 cm Tube secured with: ETT holder Chest x-ray interpreted by me. Chest x-ray findings: endotracheal tube in appropriate position Patient tolerance: Patient tolerated the procedure well with no immediate complications   (including critical care time) Labs Review Labs Reviewed  I-STAT CHEM 8, ED - Abnormal; Notable for the following:    BUN 38 (*)    Creatinine, Ser 1.70 (*)    Glucose, Bld 112 (*)    All other components within normal limits  CBG MONITORING, ED - Abnormal; Notable for the following:    Glucose-Capillary 114 (*)    All other components within normal limits  I-STAT ARTERIAL BLOOD GAS, ED - Abnormal; Notable for the following:    pH, Arterial 7.200 (*)    pCO2 arterial 69.6 (*)    pO2, Arterial 105.0 (*)    Bicarbonate 27.3 (*)    Acid-base deficit 3.0 (*)    All other components within normal limits  PROTIME-INR  APTT  CBC  DIFFERENTIAL  ETHANOL  COMPREHENSIVE METABOLIC PANEL  URINE RAPID DRUG SCREEN (HOSP PERFORMED)  URINALYSIS, ROUTINE W REFLEX MICROSCOPIC  BLOOD GAS, ARTERIAL  I-STAT TROPOININ, ED  I-STAT TROPOININ, ED    Imaging Review  Ct Head Wo Contrast  08/08/2014   CLINICAL DATA:  Stroke.  Left-sided weakness.  EXAM: CT HEAD WITHOUT CONTRAST  TECHNIQUE: Contiguous axial images were obtained from the base of the skull through the vertex without intravenous contrast.  COMPARISON:  06/19/2014  FINDINGS: There is mild diffuse low-attenuation within the subcortical and periventricular white matter compatible with chronic microvascular disease. Large area of encephalomalacia in the distribution of the right ACA territory is again identified compatible with chronic infarct. There is no evidence for acute stroke, intracranial hemorrhage or mass. The paranasal sinuses are clear. The  mastoid air cells are also clear. The calvarium is intact.  IMPRESSION: 1. No acute intracranial abnormalities identified. 2. Small vessel ischemic change and brain atrophy. 3. 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   Electronically Signed   By: Signa Kellaylor  Stroud M.D.   On: 08/08/2014 19:38   Dg Chest Portable 1 View  08/08/2014   CLINICAL DATA:  Hypoxia  EXAM: PORTABLE CHEST - 1 VIEW  COMPARISON:  None.  FINDINGS: Endotracheal tube tip is 3.7 cm above the carina. No pneumothorax. Lungs are clear. Heart size and pulmonary vascularity are normal. No adenopathy. No bone lesions.  IMPRESSION: Endotracheal tube as described without pneumothorax. No edema or consolidation.   Electronically Signed   By: Bretta Bang III M.D.   On: 08/08/2014 21:03   Dg Abd Portable 1v  08/09/2014   CLINICAL DATA:  Encounter for OG tube placement.  EXAM: PORTABLE ABDOMEN - 1 VIEW  COMPARISON:  None.  FINDINGS: Tip and side port of the enteric tube below the diaphragm in the stomach. Mild gaseous gastric distention. No dilated bowel loops to suggest obstruction.  IMPRESSION: Tip and side port of the enteric tube below the diaphragm in the stomach.   Electronically Signed   By: Rubye Oaks M.D.   On: 08/09/2014 01:17     EKG Interpretation   Date/Time:  Wednesday August 08 2014 19:20:15 EDT Ventricular Rate:  49 PR Interval:  153 QRS Duration: 92 QT Interval:  406 QTC Calculation: 366 R Axis:   55 Text Interpretation:  Sinus bradycardia Otherwise normal ECG No old  tracing to compare Confirmed by GOLDSTON  MD, SCOTT ([id number]) on 08/08/2014  7:57:38 PM      MDM   Final diagnoses:  Left-sided weakness  Left-sided weakness   Pt is a 59 yo M with hx of CAD, HTN, DM, seizures, and CVA with residual left sided weakness who presents as a code stroke.  Was last normal around 1500.  Per EMS, patient was found at 1730 somnolent and increased left sided weakness at that time.  Glucose benign.  Taken to the ED as a code  stroke.    Airway intact when evaluated immediately on arrival.  Patient was sleepy but arousable to verbal stimuli.  GCS 15.  Mildly slurred speech but understandable.  Left sided extremity weakness, leg > arm.   He was taken to CT scanner with neurology team.   Patient's CT head showed an old right ACA infarct but no new changes.  He was given 0.4 mg narcan x 2 to try to correct for any other possible cause of his somnolence, but no change in the pt.  Patient was somnolent and became somewhat sonorous.  He had several periods of apnea.  ABG was ordered, which showed pH 7.19/pCO2 70.  The decision was made to intubate him at this time.  Patient was intubated with a 7.5 ETT at 23 at the lip with a glidescope on the second attempt.  Initially unable to obtain above a grade 3 view with a Mac 3.  He was bagged with BVM and had O2 by [place] between the attempts, and remained with O2 in the 90s.   CXR confirmed placement of the tube.   Patient was then placed on propofol for sedation.   Repeat ABG post intubation improving appropriately.    Critical care called at 2030.  Will admit.    Taken to get a CTA then admitt to the ICU.    Patient was seen with ED Attending, Dr. Hilaria Ota, MD     Lenell Antu, MD 08/09/14 0140  Pricilla Loveless, MD 08/11/14 2001

## 2014-08-08 NOTE — Consult Note (Signed)
Admission H&P    Chief Complaint: Obtundation with exacerbation of weakness on left side.  HPI: Terrance Cook is an 59 y.o. male with a history of CVA, coronary artery disease, diabetes mellitus and hypertension, brought to the emergency room and code stroke status after being found stuporous and not moving his left side as well as usual. He was last seen well at 3 PM today and was found at 5:30 PM. CT scan of his head showed no acute intracranial abnormality. Remote right ACA territory infarction was noted. NIH stroke score was 11. Patient was showing difficulty with breathing with periods of apnea as well as labored respirations. O2 saturation was 98%. However, PCO2 was 70.6 and pH was 7.196. Patient was intubated by ER physician and placed on mechanical ventilation. Patient has a history of seizure disorder. No seizure was witnessed, however. He's been taking Keppra 500 mg twice a day. He was also started on Suboxone every 8 hours yesterday for pain control. Pupils were noted to be small but not pinpoint. He was given Narcan 0.4 mg X 2 prior to intubation, with minimal improvement in respirations and no improvement in level of alertness. CT angiogram of the head and neck were obtained to rule out large vessel occlusion or stenosis with possible acute thrombus. Studies were unremarkable.  LSN: 1500 on 08/08/2014 tPA Given: No: Beyond time window (3 hours) for treatment consideration when he arrived mRankin:  Past Medical History  Diagnosis Date  . Stroke   . Coronary artery disease   . History of cardiac cath   . Diabetes mellitus without complication   . Hypertension     Past Surgical History  Procedure Laterality Date  . Cardiac surgery    . Femur fracture surgery     Family history: Unavailable due to patient's obtunded state.  Social History:  reports that he has been smoking Cigarettes.  He has been smoking about 0.50 packs per day. He has never used smokeless tobacco. He reports  that he does not drink alcohol or use illicit drugs.  Allergies: No Known Allergies  Medications: Patient's preadmission medications were reviewed by me.  ROS: Unavailable due to patient's mental status.  Physical Examination: Blood pressure 156/93, pulse 72, temperature 98 F (36.7 C), temperature source Oral, resp. rate 14, SpO2 89 %.  HEENT-  Normocephalic, no lesions, without obvious abnormality.  Normal external eye and conjunctiva.  Normal TM's bilaterally.  Normal auditory canals and external ears. Normal external nose, mucus membranes and septum.  Normal pharynx. Neck supple with no masses, nodes, nodules or enlargement. Cardiovascular - regular rate and rhythm, S1, S2 normal, no murmur, click, rub or gallop Lungs - labored respirations with intermittent periods of apnea; large airway rales noted diffusely. Abdomen - soft, non-tender; bowel sounds normal; no masses,  no organomegaly Extremities - no joint deformities, effusion, or inflammation and no edema  Neurologic Examination: Mental Status: Patient was disoriented to his correct age as well as current month. He was acutely stuporous and speech was markedly slurred. Able to follow simple commands, but did remain attentive for only 5-10 seconds. Cranial Nerves: II-difficult to assess due to obtunded state; no response to visual threat on either side. III/IV/VI-Pupils were small and equal, and reacted normally to light. Extraocular movements were full and conjugate.    V/VII-no facial numbness and no facial weakness. VIII-normal. Anselmo Pickler was markedly dysarthric. XI: trapezius strength/neck flexion strength normal bilaterally XII-midline tongue extension with normal strength. Motor: Moderate weakness of left upper extremity  was noted, as well as severe weakness of left lower extremity with no discernible voluntary movement; normal strength of right extremities Sensory: Symmetrical response to noxious stimuli. Deep Tendon  Reflexes: 2+ and asymmetric (slightly greater on the left). Plantars: Mute bilaterally Cerebellar: Unable to adequately assess. Carotid auscultation: Normal  Results for orders placed or performed during the hospital encounter of 08/08/14 (from the past 48 hour(s))  Ethanol     Status: None   Collection Time: 08/08/14  7:05 PM  Result Value Ref Range   Alcohol, Ethyl (B) <5 0 - 9 mg/dL    Comment:        LOWEST DETECTABLE LIMIT FOR SERUM ALCOHOL IS 11 mg/dL FOR MEDICAL PURPOSES ONLY   Protime-INR     Status: None   Collection Time: 08/08/14  7:05 PM  Result Value Ref Range   Prothrombin Time 15.2 11.6 - 15.2 seconds   INR 1.19 0.00 - 1.49  APTT     Status: None   Collection Time: 08/08/14  7:05 PM  Result Value Ref Range   aPTT 30 24 - 37 seconds  CBC     Status: None   Collection Time: 08/08/14  7:05 PM  Result Value Ref Range   WBC 7.2 4.0 - 10.5 K/uL   RBC 4.59 4.22 - 5.81 MIL/uL   Hemoglobin 13.0 13.0 - 17.0 g/dL   HCT 39.6 39.0 - 52.0 %   MCV 86.3 78.0 - 100.0 fL   MCH 28.3 26.0 - 34.0 pg   MCHC 32.8 30.0 - 36.0 g/dL   RDW 13.2 11.5 - 15.5 %   Platelets 247 150 - 400 K/uL  Differential     Status: None   Collection Time: 08/08/14  7:05 PM  Result Value Ref Range   Neutrophils Relative % 51 43 - 77 %   Neutro Abs 3.6 1.7 - 7.7 K/uL   Lymphocytes Relative 39 12 - 46 %   Lymphs Abs 2.8 0.7 - 4.0 K/uL   Monocytes Relative 9 3 - 12 %   Monocytes Absolute 0.7 0.1 - 1.0 K/uL   Eosinophils Relative 1 0 - 5 %   Eosinophils Absolute 0.1 0.0 - 0.7 K/uL   Basophils Relative 0 0 - 1 %   Basophils Absolute 0.0 0.0 - 0.1 K/uL  Comprehensive metabolic panel     Status: Abnormal   Collection Time: 08/08/14  7:05 PM  Result Value Ref Range   Sodium 134 (L) 135 - 145 mmol/L   Potassium 4.9 3.5 - 5.1 mmol/L   Chloride 97 96 - 112 mmol/L   CO2 24 19 - 32 mmol/L   Glucose, Bld 114 (H) 70 - 99 mg/dL   BUN 30 (H) 6 - 23 mg/dL   Creatinine, Ser 1.70 (H) 0.50 - 1.35 mg/dL    Calcium 9.6 8.4 - 10.5 mg/dL   Total Protein 7.0 6.0 - 8.3 g/dL   Albumin 3.9 3.5 - 5.2 g/dL   AST 84 (H) 0 - 37 U/L   ALT 62 (H) 0 - 53 U/L   Alkaline Phosphatase 22 (L) 39 - 117 U/L   Total Bilirubin 0.8 0.3 - 1.2 mg/dL   GFR calc non Af Amer 43 (L) >90 mL/min   GFR calc Af Amer 50 (L) >90 mL/min    Comment: (NOTE) The eGFR has been calculated using the CKD EPI equation. This calculation has not been validated in all clinical situations. eGFR's persistently <90 mL/min signify possible Chronic Kidney Disease.  Anion gap 13 5 - 15  I-Stat Troponin, ED (not at [facility])     Status: None   Collection Time: 08/08/14  7:10 PM  Result Value Ref Range   Troponin i, poc 0.01 0.00 - 0.08 ng/mL   Comment 3            Comment: Due to the release kinetics of cTnI, a negative result within the first hours of the onset of symptoms does not rule out myocardial infarction with certainty. If myocardial infarction is still suspected, repeat the test at appropriate intervals.   I-Stat Chem 8, ED     Status: Abnormal   Collection Time: 08/08/14  7:12 PM  Result Value Ref Range   Sodium 135 135 - 145 mmol/L   Potassium 4.8 3.5 - 5.1 mmol/L   Chloride 98 96 - 112 mmol/L   BUN 38 (H) 6 - 23 mg/dL   Creatinine, Ser 1.70 (H) 0.50 - 1.35 mg/dL   Glucose, Bld 112 (H) 70 - 99 mg/dL   Calcium, Ion 1.20 1.12 - 1.23 mmol/L   TCO2 24 0 - 100 mmol/L   Hemoglobin 15.0 13.0 - 17.0 g/dL   HCT 44.0 39.0 - 52.0 %  CBG monitoring, ED     Status: Abnormal   Collection Time: 08/08/14  7:24 PM  Result Value Ref Range   Glucose-Capillary 114 (H) 70 - 99 mg/dL  I-Stat arterial blood gas, ED     Status: Abnormal   Collection Time: 08/08/14  7:51 PM  Result Value Ref Range   pH, Arterial 7.200 (L) 7.350 - 7.450   pCO2 arterial 69.6 (HH) 35.0 - 45.0 mmHg   pO2, Arterial 105.0 (H) 80.0 - 100.0 mmHg   Bicarbonate 27.3 (H) 20.0 - 24.0 mEq/L   TCO2 29 0 - 100 mmol/L   O2 Saturation 96.0 %   Acid-base deficit 3.0  (H) 0.0 - 2.0 mmol/L   Patient temperature 98.0 F    Collection site RADIAL, ALLEN'S TEST ACCEPTABLE    Drawn by RT    Sample type ARTERIAL    Comment NOTIFIED PHYSICIAN    Ct Head Wo Contrast  08/08/2014   CLINICAL DATA:  Stroke.  Left-sided weakness.  EXAM: CT HEAD WITHOUT CONTRAST  TECHNIQUE: Contiguous axial images were obtained from the base of the skull through the vertex without intravenous contrast.  COMPARISON:  06/19/2014  FINDINGS: There is mild diffuse low-attenuation within the subcortical and periventricular white matter compatible with chronic microvascular disease. Large area of encephalomalacia in the distribution of the right ACA territory is again identified compatible with chronic infarct. There is no evidence for acute stroke, intracranial hemorrhage or mass. The paranasal sinuses are clear. The mastoid air cells are also clear. The calvarium is intact.  IMPRESSION: 1. No acute intracranial abnormalities identified. 2. Small vessel ischemic change and brain atrophy. 3. 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   Electronically Signed   By: Kerby Moors M.D.   On: 08/08/2014 19:38    Assessment: 59 y.o. male with multiple risk factors for stroke presenting with reduced responsiveness as well as exacerbation of left-sided weakness compared to baseline. Acute recurrent stroke cannot be ruled out. As well, new onset seizure disorder with Todd's paralysis cannot ruled out.  Stroke Risk Factors - diabetes mellitus, hyperlipidemia and hypertension  Plan: 1. HgbA1c, fasting lipid panel 2. MRI of the brain without contrast 3. PT consult, OT consult, Speech consult 4. Echocardiogram 5. Prophylactic therapy-Antiplatelet med: Aspirin  6. EEG, routine about  study 7. Telemetry monitoring  This patient is critically ill and at significant risk of neurological worsening or death, and care requires constant monitoring of vital signs, hemodynamics,respiratory and cardiac monitoring, neurological  assessment, discussion with family, other specialists and medical decision making of high complexity. Total critical care time was 90 minutes.  C.R. Nicole Kindred, MD [facility] [phone]  08/08/2014, 8:21 PM

## 2014-08-08 NOTE — Consult Note (Deleted)
PULMONARY / CRITICAL CARE MEDICINE   Name: Terrance HalimJames Kishbaugh MRN: [id number] DOB: 12-29-1955    ADMISSION DATE:  08/08/2014 CONSULTATION DATE:  08/08/2014  REFERRING MD :  Roseanne RenoStewart  CHIEF COMPLAINT:  AMS  INITIAL PRESENTATION: 59 year old male [facility] with AMS, admitted to neurology with possible stroke. In ED was intubated due to periods of apnea. To ICU, PCCM to assist with medical management.   STUDIES:  CT head 4/13 > No acute intracranial abnormalities identified. Small vessel ischemic change and brain atrophy. 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 CTA head 4/13 >. No acute abnormality within the major arterial vasculature of the Neck. Multifocal atheromatous plaque about the carotid bifurcations/proximal internal carotid arteries bilaterally. There is associated short-segment stenosis of approximately 60% at the right carotid bifurcation, with short-segment stenosis of approximately 50-60% at the left carotid bifurcation.  SIGNIFICANT EVENTS:   HISTORY OF PRESENT ILLNESS:  59 year old male with PMH CVA, CAD, DM, and HTN [facility] with AMS. He was stuporous and having some L sided weakness. Last seen normal 1500. Neurology evaluated him in ED and ordered CT head, which showed no acute abnormality but remote ACA infarct. He had labored respirations and several periods of apnea so was intubated for airway protection in ED. He is on multiple medications for pain, seizures, and HTN and is from SNF. He was admitted to ICU for ventilator support and monitoring. PCCM to assist with medical management.   PAST MEDICAL HISTORY :   has a past medical history of Stroke; Coronary artery disease; History of cardiac cath; Diabetes mellitus without complication; and Hypertension.  has past surgical history that includes Cardiac surgery and Femur fracture surgery. Prior to Admission medications   Medication Sig Start Date End Date Taking? Authorizing Provider  amLODipine (NORVASC) 5  MG tablet Take 10 mg by mouth daily.   Yes Historical Provider, MD  aspirin 81 MG chewable tablet Chew 81 mg by mouth daily.   Yes Historical Provider, MD  atenolol (TENORMIN) 50 MG tablet Take 50 mg by mouth 2 (two) times daily.   Yes Historical Provider, MD  atorvastatin (LIPITOR) 20 MG tablet Take 20 mg by mouth daily.   Yes Historical Provider, MD  baclofen (LIORESAL) 10 MG tablet Take 10 mg by mouth 3 (three) times daily.   Yes Historical Provider, MD  benazepril (LOTENSIN) 40 MG tablet Take 40 mg by mouth daily.   Yes Historical Provider, MD  buprenorphine-naloxone (SUBOXONE) 8-2 MG SUBL SL tablet Place 1 tablet under the tongue every 8 (eight) hours. Take for 30 days. Started on 08-07-14   Yes Historical Provider, MD  diazepam (VALIUM) 5 MG tablet Take 5 mg by mouth 3 (three) times daily.   Yes Historical Provider, MD  DULoxetine (CYMBALTA) 60 MG capsule Take 60 mg by mouth daily.   Yes Historical Provider, MD  etodolac (LODINE) 300 MG capsule Take 300 mg by mouth 3 (three) times daily.   Yes Historical Provider, MD  febuxostat (ULORIC) 40 MG tablet Take 40 mg by mouth daily.   Yes Historical Provider, MD  fenofibrate 160 MG tablet Take 160 mg by mouth daily.   Yes Historical Provider, MD  furosemide (LASIX) 20 MG tablet Take 20 mg by mouth daily.   Yes Historical Provider, MD  gabapentin (NEURONTIN) 800 MG tablet Take 800 mg by mouth 2 (two) times daily.    Yes Historical Provider, MD  HYDROcodone-acetaminophen (NORCO/VICODIN) 5-325 MG per tablet Take 1  tablet by mouth every 6 (six) hours as needed for moderate pain.   Yes Historical Provider, MD  hydrOXYzine (ATARAX/VISTARIL) 50 MG tablet Take 50 mg by mouth every 6 (six) hours as needed for anxiety.   Yes Historical Provider, MD  lamoTRIgine (LAMICTAL) 25 MG tablet Take 25 mg by mouth daily.    Yes Historical Provider, MD  levETIRAcetam (KEPPRA) 500 MG tablet Take 1,000 mg by mouth 2 (two) times daily.   Yes Historical Provider, MD   lidocaine (LIDODERM) 5 % Place 1 patch onto the skin daily. Remove & Discard patch within 12 hours or as directed by MD   Yes Historical Provider, MD  loperamide (IMODIUM A-D) 2 MG tablet Take 2 mg by mouth 4 (four) times daily as needed for diarrhea or loose stools.   Yes Historical Provider, MD  metFORMIN (GLUCOPHAGE) 1000 MG tablet Take 1,000 mg by mouth 2 (two) times daily with a meal.   Yes Historical Provider, MD  nicotine (NICODERM CQ - DOSED IN MG/24 HOURS) 21 mg/24hr patch Place 21 mg onto the skin daily.   Yes Historical Provider, MD  nitroGLYCERIN (NITROSTAT) 0.4 MG SL tablet Place 0.4 mg under the tongue every 5 (five) minutes as needed for chest pain.   Yes Historical Provider, MD  omeprazole (PRILOSEC) 20 MG capsule Take 20 mg by mouth daily.   Yes Historical Provider, MD  oxybutynin (DITROPAN-XL) 5 MG 24 hr tablet Take 5 mg by mouth at bedtime.   Yes Historical Provider, MD  polyethylene glycol (MIRALAX / GLYCOLAX) packet Take 17 g by mouth daily.   Yes Historical Provider, MD  QUEtiapine (SEROQUEL) 25 MG tablet Take 25 mg by mouth at bedtime.   Yes Historical Provider, MD  diclofenac sodium (VOLTAREN) 1 % GEL Apply 2 g topically 4 (four) times daily. Apply to hip    Historical Provider, MD  oxyCODONE-acetaminophen (PERCOCET/ROXICET) 5-325 MG per tablet Take 2 tablets by mouth every 6 (six) hours as needed for severe pain. Patient not taking: Reported on 08/08/2014 06/20/14   Arby Barrette, MD   No Known Allergies  FAMILY HISTORY:  has no family status information on file.  SOCIAL HISTORY:  reports that he has been smoking Cigarettes.  He has been smoking about 0.50 packs per day. He has never used smokeless tobacco. He reports that he does not drink alcohol or use illicit drugs.  REVIEW OF SYSTEMS:  unable SUBJECTIVE:   VITAL SIGNS: Temp:  [98 F (36.7 C)] 98 F (36.7 C) (04/13 1927) Pulse Rate:  [51-72] 51 (04/13 2130) Resp:  [9-24] 18 (04/13 2130) BP: (96-201)/(56-93)  113/59 mmHg (04/13 2130) SpO2:  [88 %-100 %] 100 % (04/13 2130) FiO2 (%):  [100 %] 100 % (04/13 2024) HEMODYNAMICS:   VENTILATOR SETTINGS: Vent Mode:  [-] PRVC FiO2 (%):  [100 %] 100 % Set Rate:  [18 bmp] 18 bmp Vt Set:  [550 mL] 550 mL PEEP:  [5 cmH20] 5 cmH20 Plateau Pressure:  [18 cmH20] 18 cmH20 INTAKE / OUTPUT:  Intake/Output Summary (Last 24 hours) at 08/08/14 2243 Last data filed at 08/08/14 2105  Gross per 24 hour  Intake      0 ml  Output    850 ml  Net   -850 ml    PHYSICAL EXAMINATION: General:  Obese male in NAD on vent Neuro:  RASS -2. alert to verbal.  HEENT:  [place]/AT, Pinpoint pupils Cardiovascular:  RRR, no MRG Lungs:  Clear bilateral breath sounds Abdomen:  Soft, non-distended  Musculoskeletal:  No acute deformity Skin:  Grossly intact  LABS:  CBC  Recent Labs Lab 08/08/14 1905 08/08/14 1912  WBC 7.2  --   HGB 13.0 15.0  HCT 39.6 44.0  PLT 247  --    Coag's  Recent Labs Lab 08/08/14 1905  APTT 30  INR 1.19   BMET  Recent Labs Lab 08/08/14 1905 08/08/14 1912  NA 134* 135  K 4.9 4.8  CL 97 98  CO2 24  --   BUN 30* 38*  CREATININE 1.70* 1.70*  GLUCOSE 114* 112*   Electrolytes  Recent Labs Lab 08/08/14 1905  CALCIUM 9.6   Sepsis Markers No results for input(s): LATICACIDVEN, PROCALCITON, O2SATVEN in the last 168 hours. ABG  Recent Labs Lab 08/08/14 1951 08/08/14 2119  PHART 7.200* 7.324*  PCO2ART 69.6* 53.4*  PO2ART 105.0* 351.0*   Liver Enzymes  Recent Labs Lab 08/08/14 1905  AST 84*  ALT 62*  ALKPHOS 22*  BILITOT 0.8  ALBUMIN 3.9   Cardiac Enzymes No results for input(s): TROPONINI, PROBNP in the last 168 hours. Glucose  Recent Labs Lab 08/08/14 1924  GLUCAP 114*    Imaging No results found.   ASSESSMENT / PLAN:  PULMONARY OETT 4/13 > A: Acute hypoxemic respiratory failure in setting AMS  P:   Full vent support VAP bundle Bronchodilators  CARDIOVASCULAR A:  Hypotension H/o HTN P:   Tele Continue statin, asa Hold ACEi, amlodipine, atenolol PRN metoprolol for tachycardia PRN hydralazine for HTN  RENAL A:   Renal insufficiency  P:   Hold ACE IVF hydration BMET in AM  GASTROINTESTINAL A:   GERD  P:   Continue home PPI  NPO  HEMATOLOGIC A:   No acute issues  P:  Heparin for VTE ppx CBC in am  INFECTIOUS A:   No acute issues  P:   Monitor for    ENDOCRINE A:   DM    P:   CBG monitoring and SSI Holding metformin  NEUROLOGIC A:   Acute encephalopathy, suspect polypharmacy induced. Seizure disorder Chronic pain ?CVA P:   RASS goal: 0/-1 Propofol Continue home valium, gabapentin (reduced dose), Lamictal, keppra Neuro checks Neurology following  FAMILY  - Updates:   - Inter-disciplinary family meet or Palliative Care meeting due by:  4/20   Joneen Roach, AGACNP-BC [place] Pulmon[facility]/Critical Care Pager [phone] or [phone]  08/08/2014 11:24 PM  PCCM ATTENDING: I have reviewed pt's initial presentation, consultants notes and hospital database in detail.  The above assessment and plan was formulated under my direction.  In summary: This relatively young man is chronically ill, SNF resident who takes a multitude of medications. His AMS is not presently well explained by CT scan findings. He was intubated due to AMS. He should be extubated once his cognition permits. The care plan above and reflected in the orders was formulated by me    Billy Fischer, MD;  PCCM service; Mobile [phone]

## 2014-08-08 NOTE — ED Notes (Signed)
Port CXR at bedside for confirmation of tube placement.

## 2014-08-08 NOTE — ED Notes (Signed)
[facility]. Pt last seen normal at 1500, symptoms discovered 1730. Pt has left sided weakness that is worse than deficits from previous stroke and ALOC. Code stroke called by [facility] at 1853.

## 2014-08-08 NOTE — Sedation Documentation (Signed)
Pt intubated with 7.5 endotracheal tube taped at 23 at lips.

## 2014-08-08 NOTE — Progress Notes (Signed)
Critical ABG reported to Dr. Delford FieldWright.

## 2014-08-08 NOTE — Code Documentation (Signed)
Code stroke called at 1853 for this [facility] patient who was LSN at 1500.  When the staff checked on him at 1730 he was found to have more pronounced left sided weakness and altered mental status with increased drowsiness.  [facility]  CBG 123,  He was cleared for CT at 1903 by EDP with arrival in CT at 1904.  No acute intracranial abnormality identified.  Pt was taken to room B17 where his NIHSS scored 11.  Pt was noted to be extremely drowsy, awakening only for a few seconds before drifting back to sleep.  Narcan 0.4mg  x 2  given  Per Dr Roseanne RenoStewart with no improvement noted in lethargy. Pt also noted to have an abnormal breathing pattern.  ABG was obtained reveling  Ph 7.2  PCO2  70  PO2 105.  Decision was made to intubate pt in ED prior to CTA.  Pt moved to Trauma A for intubation and mechanical ventilation.  CTA Pending. Admit to 3MW

## 2014-08-09 ENCOUNTER — Inpatient Hospital Stay (HOSPITAL_COMMUNITY): Payer: Medicare Other

## 2014-08-09 DIAGNOSIS — I10 Essential (primary) hypertension: Secondary | ICD-10-CM

## 2014-08-09 DIAGNOSIS — J9602 Acute respiratory failure with hypercapnia: Secondary | ICD-10-CM

## 2014-08-09 DIAGNOSIS — G40909 Epilepsy, unspecified, not intractable, without status epilepticus: Secondary | ICD-10-CM

## 2014-08-09 DIAGNOSIS — R4182 Altered mental status, unspecified: Secondary | ICD-10-CM

## 2014-08-09 DIAGNOSIS — E119 Type 2 diabetes mellitus without complications: Secondary | ICD-10-CM

## 2014-08-09 DIAGNOSIS — Z8673 Personal history of transient ischemic attack (TIA), and cerebral infarction without residual deficits: Secondary | ICD-10-CM

## 2014-08-09 DIAGNOSIS — E785 Hyperlipidemia, unspecified: Secondary | ICD-10-CM

## 2014-08-09 DIAGNOSIS — G8194 Hemiplegia, unspecified affecting left nondominant side: Secondary | ICD-10-CM

## 2014-08-09 LAB — URINALYSIS, ROUTINE W REFLEX MICROSCOPIC
Glucose, UA: NEGATIVE mg/dL
HGB URINE DIPSTICK: NEGATIVE
Ketones, ur: NEGATIVE mg/dL
Nitrite: NEGATIVE
Protein, ur: NEGATIVE mg/dL
SPECIFIC GRAVITY, URINE: 1.014 (ref 1.005–1.030)
Urobilinogen, UA: 1 mg/dL (ref 0.0–1.0)
pH: 5 (ref 5.0–8.0)

## 2014-08-09 LAB — URINE MICROSCOPIC-ADD ON

## 2014-08-09 LAB — LIPID PANEL
Cholesterol: 135 mg/dL (ref 0–200)
HDL: 21 mg/dL — ABNORMAL LOW (ref >39)
LDL Cholesterol: 38 mg/dL (ref 0–99)
Total CHOL/HDL Ratio: 6.4 RATIO
Triglycerides: 380 mg/dL — ABNORMAL HIGH (ref <150)
VLDL: 76 mg/dL — ABNORMAL HIGH (ref 0–40)

## 2014-08-09 LAB — T4, FREE: FREE T4: 1.12 ng/dL (ref 0.80–1.80)

## 2014-08-09 LAB — GLUCOSE, CAPILLARY
GLUCOSE-CAPILLARY: 128 mg/dL — AB (ref 70–99)
GLUCOSE-CAPILLARY: 94 mg/dL (ref 70–99)
Glucose-Capillary: 115 mg/dL — ABNORMAL HIGH (ref 70–99)
Glucose-Capillary: 120 mg/dL — ABNORMAL HIGH (ref 70–99)
Glucose-Capillary: 124 mg/dL — ABNORMAL HIGH (ref 70–99)
Glucose-Capillary: 128 mg/dL — ABNORMAL HIGH (ref 70–99)
Glucose-Capillary: 87 mg/dL (ref 70–99)

## 2014-08-09 LAB — CBC
HCT: 39.6 % (ref 39.0–52.0)
Hemoglobin: 13.3 g/dL (ref 13.0–17.0)
MCH: 28.8 pg (ref 26.0–34.0)
MCHC: 33.6 g/dL (ref 30.0–36.0)
MCV: 85.7 fL (ref 78.0–100.0)
Platelets: 107 10*3/uL — ABNORMAL LOW (ref 150–400)
RBC: 4.62 MIL/uL (ref 4.22–5.81)
RDW: 13.3 % (ref 11.5–15.5)
WBC: 7.3 10*3/uL (ref 4.0–10.5)

## 2014-08-09 LAB — BASIC METABOLIC PANEL
Anion gap: 16 — ABNORMAL HIGH (ref 5–15)
BUN: 30 mg/dL — ABNORMAL HIGH (ref 6–23)
CO2: 21 mmol/L (ref 19–32)
Calcium: 9.6 mg/dL (ref 8.4–10.5)
Chloride: 101 mmol/L (ref 96–112)
Creatinine, Ser: 1.32 mg/dL (ref 0.50–1.35)
GFR calc Af Amer: 68 mL/min — ABNORMAL LOW (ref >90)
GFR calc non Af Amer: 59 mL/min — ABNORMAL LOW (ref >90)
Glucose, Bld: 94 mg/dL (ref 70–99)
Potassium: 4.4 mmol/L (ref 3.5–5.1)
Sodium: 138 mmol/L (ref 135–145)

## 2014-08-09 LAB — TSH: TSH: 0.481 u[IU]/mL (ref 0.350–4.500)

## 2014-08-09 LAB — TRIGLYCERIDES: TRIGLYCERIDES: 413 mg/dL — AB (ref <150)

## 2014-08-09 MED ORDER — MIDAZOLAM HCL 2 MG/2ML IJ SOLN
INTRAMUSCULAR | Status: AC
  Filled 2014-08-09: qty 2

## 2014-08-09 MED ORDER — PIPERACILLIN-TAZOBACTAM 3.375 G IVPB
3.3750 g | INTRAVENOUS | Status: AC
  Administered 2014-08-09: 3.375 g via INTRAVENOUS
  Filled 2014-08-09: qty 50

## 2014-08-09 MED ORDER — MIDAZOLAM HCL 2 MG/2ML IJ SOLN
1.0000 mg | INTRAMUSCULAR | Status: DC | PRN
  Administered 2014-08-09: 2 mg via INTRAVENOUS
  Administered 2014-08-11: 1 mg via INTRAVENOUS
  Administered 2014-08-11: 2 mg via INTRAVENOUS
  Administered 2014-08-11: 1 mg via INTRAVENOUS
  Filled 2014-08-09 (×3): qty 2

## 2014-08-09 MED ORDER — PIPERACILLIN-TAZOBACTAM 3.375 G IVPB
3.3750 g | Freq: Three times a day (TID) | INTRAVENOUS | Status: DC
  Administered 2014-08-10 (×2): 3.375 g via INTRAVENOUS
  Filled 2014-08-09 (×3): qty 50

## 2014-08-09 MED ORDER — FENTANYL CITRATE (PF) 100 MCG/2ML IJ SOLN
25.0000 ug | INTRAMUSCULAR | Status: DC | PRN
  Administered 2014-08-09: 25 ug via INTRAVENOUS

## 2014-08-09 MED ORDER — CETYLPYRIDINIUM CHLORIDE 0.05 % MT LIQD
7.0000 mL | Freq: Four times a day (QID) | OROMUCOSAL | Status: DC
  Administered 2014-08-09 – 2014-08-13 (×12): 7 mL via OROMUCOSAL

## 2014-08-09 MED ORDER — PROPOFOL 1000 MG/100ML IV EMUL: 5.0000 ug/kg/min | INTRAVENOUS | Status: DC

## 2014-08-09 MED ORDER — CHLORHEXIDINE GLUCONATE 0.12 % MT SOLN
15.0000 mL | Freq: Two times a day (BID) | OROMUCOSAL | Status: DC
  Administered 2014-08-09 – 2014-08-13 (×9): 15 mL via OROMUCOSAL
  Filled 2014-08-09 (×10): qty 15

## 2014-08-09 MED ORDER — ACETAMINOPHEN 160 MG/5ML PO SOLN
ORAL | Status: AC
  Filled 2014-08-09: qty 20.3

## 2014-08-09 MED ORDER — ACETAMINOPHEN 160 MG/5ML PO SOLN
650.0000 mg | Freq: Four times a day (QID) | ORAL | Status: DC | PRN
  Administered 2014-08-09 – 2014-08-10 (×2): 650 mg
  Filled 2014-08-09: qty 20.3

## 2014-08-09 MED ORDER — DEXMEDETOMIDINE HCL IN NACL 200 MCG/50ML IV SOLN
0.2000 ug/kg/h | INTRAVENOUS | Status: AC
  Administered 2014-08-09 – 2014-08-10 (×2): 0.2 ug/kg/h via INTRAVENOUS
  Filled 2014-08-09 (×2): qty 50

## 2014-08-09 MED ORDER — VANCOMYCIN HCL IN DEXTROSE 1-5 GM/200ML-% IV SOLN
1000.0000 mg | Freq: Two times a day (BID) | INTRAVENOUS | Status: DC
  Administered 2014-08-09 – 2014-08-10 (×2): 1000 mg via INTRAVENOUS
  Filled 2014-08-09 (×3): qty 200

## 2014-08-09 MED ORDER — ACETAMINOPHEN 650 MG RE SUPP
650.0000 mg | Freq: Four times a day (QID) | RECTAL | Status: DC | PRN
  Administered 2014-08-09: 650 mg via RECTAL
  Filled 2014-08-09: qty 1

## 2014-08-09 MED ORDER — FENTANYL CITRATE (PF) 100 MCG/2ML IJ SOLN
25.0000 ug | INTRAMUSCULAR | Status: DC | PRN
  Administered 2014-08-11 (×3): 100 ug via INTRAVENOUS

## 2014-08-09 NOTE — Progress Notes (Signed)
eLink Physician-Brief Progress Note Patient Name: Terrance HalimJames Karr DOB: [birth year] MRN: [id number]   Date of Service  08/09/2014  HPI/Events of Note  Fever  eICU Interventions  Tylenol prn        Maynard David 08/09/2014, 6:03 AM

## 2014-08-09 NOTE — Progress Notes (Signed)
Patient arrived to unit with clothing, shoes, lighter, $0.75 in quarters, a ring, necklace, and watch. The quarters, ring, necklace, and watch were taken to security. Patient unable to sign since intubated. Receipt placed in patient's chart.

## 2014-08-09 NOTE — Progress Notes (Signed)
PULMONARY / CRITICAL CARE MEDICINE   Name: Terrance Cook MRN: [id number] DOB: 23-Jul-1955    ADMISSION DATE:  08/08/2014 CONSULTATION DATE:  08/08/2014  REFERRING MD :  Roseanne Reno  CHIEF COMPLAINT:  AMS  INITIAL PRESENTATION: 59 year old male presented to [facility] ED 4/13 with AMS, admitted to neurology with possible stroke. In ED was intubated due to periods of apnea. To ICU, PCCM to assist with medical management.   STUDIES:  CT head 4/13 > No acute intracranial abnormalities identified. Small vessel ischemic change and brain atrophy. 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 CTA head 4/13 >. No acute abnormality within the major arterial vasculature of the Neck. Multifocal atheromatous plaque about the carotid bifurcations/proximal internal carotid arteries bilaterally. There is associated short-segment stenosis of approximately 60% at the right carotid bifurcation, with short-segment stenosis of approximately 50-60% at the left carotid bifurcation.  SIGNIFICANT EVENTS:   HISTORY OF PRESENT ILLNESS:  59 year old male with PMH CVA, CAD, DM, and HTN presented to [facility] ED 4/13 with AMS. He was stuporous and having some L sided weakness. Last seen normal 1500. Neurology evaluated him in ED and ordered CT head, which showed no acute abnormality but remote ACA infarct. He had labored respirations and several periods of apnea so was intubated for airway protection in ED. He is on multiple medications for pain, seizures, and HTN and is from SNF. He was admitted to ICU for ventilator support and monitoring. PCCM to assist with medical management.   SUBJECTIVE:  Tolerating PSV this am Note that UDS was positive for benzos, narcs, THC  VITAL SIGNS: Temp:  [98 F (36.7 C)-101.1 F (38.4 C)] 99.7 F (37.6 C) (04/14 1000) Pulse Rate:  [51-78] 78 (04/14 1000) Resp:  [9-24] 12 (04/14 1000) BP: (84-201)/(49-93) 125/63 mmHg (04/14 1000) SpO2:  [88 %-100 %] 94 % (04/14 1000) FiO2 (%):  [40 %-100 %] 40 % (04/14  1000) Weight:  [113 kg (249 lb 1.9 oz)] 113 kg (249 lb 1.9 oz) (04/13 2244) HEMODYNAMICS:   VENTILATOR SETTINGS: Vent Mode:  [-] PRVC FiO2 (%):  [40 %-100 %] 40 % Set Rate:  [18 bmp-20 bmp] 20 bmp Vt Set:  [550 mL] 550 mL PEEP:  [5 cmH20] 5 cmH20 Plateau Pressure:  [17 cmH20-20 cmH20] 20 cmH20 INTAKE / OUTPUT:  Intake/Output Summary (Last 24 hours) at 08/09/14 1021 Last data filed at 08/09/14 1000  Gross per 24 hour  Intake  552.9 ml  Output   1150 ml  Net -597.1 ml    PHYSICAL EXAMINATION: General:  Obese male in NAD on vent Neuro:  RASS -1, unable to move LLE, following commands HEENT:  [place]/AT, pupils sluggish but react Cardiovascular:  RRR, no MRG Lungs:  Clear bilateral breath sounds Abdomen:  Soft, non-distended Musculoskeletal:  No acute deformity Skin:  Grossly intact  LABS:  CBC  Recent Labs Lab 08/08/14 1905 08/08/14 1912 08/09/14 0305  WBC 7.2  --  7.3  HGB 13.0 15.0 13.3  HCT 39.6 44.0 39.6  PLT 247  --  107*   Coag's  Recent Labs Lab 08/08/14 1905  APTT 30  INR 1.19   BMET  Recent Labs Lab 08/08/14 1905 08/08/14 1912 08/09/14 0305  NA 134* 135 138  K 4.9 4.8 4.4  CL 97 98 101  CO2 24  --  21  BUN 30* 38* 30*  CREATININE 1.70* 1.70* 1.32  GLUCOSE 114* 112* 94   Electrolytes  Recent Labs Lab 08/08/14 1905 08/09/14 0305  CALCIUM 9.6  9.6   Sepsis Markers No results for input(s): LATICACIDVEN, PROCALCITON, O2SATVEN in the last 168 hours. ABG  Recent Labs Lab 08/08/14 1951 08/08/14 2119  PHART 7.200* 7.324*  PCO2ART 69.6* 53.4*  PO2ART 105.0* 351.0*   Liver Enzymes  Recent Labs Lab 08/08/14 1905  AST 84*  ALT 62*  ALKPHOS 22*  BILITOT 0.8  ALBUMIN 3.9   Cardiac Enzymes No results for input(s): TROPONINI, PROBNP in the last 168 hours. Glucose  Recent Labs Lab 08/08/14 1924 08/09/14 0002 08/09/14 0341  GLUCAP 114* 87 94    Imaging Ct Angio Head W/cm &/or Wo Cm  08/08/2014   EXAM: CT ANGIOGRAPHY HEAD  AND NECK  TECHNIQUE: Multidetector CT imaging of the head and neck was performed using the standard protocol during bolus administration of intravenous contrast. Multiplanar CT image reconstructions and MIPs were obtained to evaluate the vascular anatomy. Carotid stenosis measurements (when applicable) are obtained utilizing NASCET criteria, using the distal internal carotid diameter as the denominator.  COMPARISON:  None.  FINDINGS: CTA NECK  Aortic arch: Visualize aortic arch of normal caliber. Scattered calcified atheromatous plaque present within the aortic arch in at the origin of the great vessels, particularly the left subclavian artery. No high-grade stenosis seen at the origin of the great vessels. Visualized subclavian arteries widely patent.  Right carotid system: Right common carotid artery well opacified from its origin to the carotid bifurcation. Calcified and noncalcified plaque present about the right carotid bifurcation. There is associated short-segment stenosis of approximately 60% by NASCET criteria about the right carotid bifurcation. Distally, the right internal carotid artery is well opacified to the skullbase without stenosis, occlusion, or dissection.  Left carotid system: Left common carotid artery well opacified from its origin to the carotid bifurcation. There is calcified and noncalcified plaque about the left carotid bifurcation extending into the proximal left internal carotid artery. There is associated irregular short segment stenosis of approximately 50-60% by NASCET criteria within the proximal left ICA. Focal contrast filled outpouching extending from the posterior aspect of the proximal left ICA near the area of greatest stenosis likely reflects a small penetrating atheromatous plaque. This area of atheromatous stenosis and irregularity extends from the left carotid bifurcation, and measures approximately 15 mm in length. Distally, the left ICA is well opacified to the skullbase  without stenosis, dissection, or occlusion.  Vertebral arteries:Both vertebral arteries arise from the subclavian arteries. The left vertebral artery is dominant. Vertebral arteries are well opacified along their entire course without evidence for dissection, occlusion, or stenosis. There is focal calcified plaque at the origin of the right vertebral artery. Mild atheromatous plaque present within the distal left vertebral artery as it courses into the cranial vault.  Skeleton: Vertebral bodies are normally aligned with preservation of the normal cervical lordosis. Mild degenerative disc disease present at C5-6. No acute osseous abnormality. No worrisome lytic or blastic osseous lesion.  Other neck: No acute soft tissue abnormality within the neck. No adenopathy. Visualized superior mediastinum within normal limits. Mild atelectatic changes seen dependently within the visualized lung bases. Endotracheal and enteric tubes in place. Fluid density within the nasopharynx likely related intubation.  CTA HEAD  Anterior circulation: The petrous segments of the internal carotid arteries are widely patent bilaterally. Scattered mild multi focal atheromatous plaque present within the cavernous carotid arteries without significant stenosis. Supraclinoid segments widely patent. A1 segments and anterior communicating artery are widely patent. The anterior cerebral arteries are patent proximally, although the right A2 segment is diminutive, likely  related to prior right ACA infarct.  M1 segments patent bilaterally without proximal branch occlusion or significant stenosis. MCA branches are fairly symmetric bilaterally. No proximal occlusion or embolus identified within the proximal M2 segments.  Posterior circulation: The left vertebral artery is dominant mild to moderate multi focal stenoses. The diminutive right vertebral artery widely patent. Vertebrobasilar junction normal. Posterior inferior cerebral arteries patent  bilaterally. Atheromatous irregularity present within the proximal basilar artery without significant stenosis or occlusion. Superior cerebellar arteries are patent bilaterally. Posterior cerebral arteries opacified bilaterally. No arterial branch occlusion upper significant stenosis identified within the intracranial circulation.  Venous sinuses: No acute abnormality identified within the venous sinuses.  Anatomic variants: No anatomic variant.  No aneurysm.  Delayed phase:  No abnormal enhancement on delayed sequences.  IMPRESSION: CTA NECK IMPRESSION:  1. No acute abnormality within the major arterial vasculature of the neck. 2. Multifocal atheromatous plaque about the carotid bifurcations/proximal internal carotid arteries bilaterally. There is associated short-segment stenosis of approximately 60% at the right carotid bifurcation, with short-segment stenosis of approximately 50-60% at the left carotid bifurcation.  CTA HEAD IMPRESSION:  1. No proximal branch occlusion or hemodynamically significant stenosis identified within the intracranial circulation. 2. Diminutive right A2 segment, likely related to prior right ACA infarct. 3. Mild multi focal atheromatous plaque within the cavernous carotid arteries bilaterally without significant stenosis. 4. Mild multi focal atherosclerotic irregularity and stenosis within the distal left V4 segment.   Electronically Signed   By: Rise Mu M.D.   On: 08/08/2014 23:06   Ct Head Wo Contrast  08/08/2014   CLINICAL DATA:  Stroke.  Left-sided weakness.  EXAM: CT HEAD WITHOUT CONTRAST  TECHNIQUE: Contiguous axial images were obtained from the base of the skull through the vertex without intravenous contrast.  COMPARISON:  06/19/2014  FINDINGS: There is mild diffuse low-attenuation within the subcortical and periventricular white matter compatible with chronic microvascular disease. Large area of encephalomalacia in the distribution of the right ACA territory is  again identified compatible with chronic infarct. There is no evidence for acute stroke, intracranial hemorrhage or mass. The paranasal sinuses are clear. The mastoid air cells are also clear. The calvarium is intact.  IMPRESSION: 1. No acute intracranial abnormalities identified. 2. Small vessel ischemic change and brain atrophy. 3. 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   Electronically Signed   By: Signa Kell M.D.   On: 08/08/2014 19:38   Ct Angio Neck W/cm &/or Wo/cm  08/08/2014   EXAM: CT ANGIOGRAPHY HEAD AND NECK  TECHNIQUE: Multidetector CT imaging of the head and neck was performed using the standard protocol during bolus administration of intravenous contrast. Multiplanar CT image reconstructions and MIPs were obtained to evaluate the vascular anatomy. Carotid stenosis measurements (when applicable) are obtained utilizing NASCET criteria, using the distal internal carotid diameter as the denominator.  COMPARISON:  None.  FINDINGS: CTA NECK  Aortic arch: Visualize aortic arch of normal caliber. Scattered calcified atheromatous plaque present within the aortic arch in at the origin of the great vessels, particularly the left subclavian artery. No high-grade stenosis seen at the origin of the great vessels. Visualized subclavian arteries widely patent.  Right carotid system: Right common carotid artery well opacified from its origin to the carotid bifurcation. Calcified and noncalcified plaque present about the right carotid bifurcation. There is associated short-segment stenosis of approximately 60% by NASCET criteria about the right carotid bifurcation. Distally, the right internal carotid artery is well opacified to the skullbase without stenosis, occlusion, or dissection.  Left carotid system: Left common carotid artery well opacified from its origin to the carotid bifurcation. There is calcified and noncalcified plaque about the left carotid bifurcation extending into the proximal left internal carotid  artery. There is associated irregular short segment stenosis of approximately 50-60% by NASCET criteria within the proximal left ICA. Focal contrast filled outpouching extending from the posterior aspect of the proximal left ICA near the area of greatest stenosis likely reflects a small penetrating atheromatous plaque. This area of atheromatous stenosis and irregularity extends from the left carotid bifurcation, and measures approximately 15 mm in length. Distally, the left ICA is well opacified to the skullbase without stenosis, dissection, or occlusion.  Vertebral arteries:Both vertebral arteries arise from the subclavian arteries. The left vertebral artery is dominant. Vertebral arteries are well opacified along their entire course without evidence for dissection, occlusion, or stenosis. There is focal calcified plaque at the origin of the right vertebral artery. Mild atheromatous plaque present within the distal left vertebral artery as it courses into the cranial vault.  Skeleton: Vertebral bodies are normally aligned with preservation of the normal cervical lordosis. Mild degenerative disc disease present at C5-6. No acute osseous abnormality. No worrisome lytic or blastic osseous lesion.  Other neck: No acute soft tissue abnormality within the neck. No adenopathy. Visualized superior mediastinum within normal limits. Mild atelectatic changes seen dependently within the visualized lung bases. Endotracheal and enteric tubes in place. Fluid density within the nasopharynx likely related intubation.  CTA HEAD  Anterior circulation: The petrous segments of the internal carotid arteries are widely patent bilaterally. Scattered mild multi focal atheromatous plaque present within the cavernous carotid arteries without significant stenosis. Supraclinoid segments widely patent. A1 segments and anterior communicating artery are widely patent. The anterior cerebral arteries are patent proximally, although the right A2  segment is diminutive, likely related to prior right ACA infarct.  M1 segments patent bilaterally without proximal branch occlusion or significant stenosis. MCA branches are fairly symmetric bilaterally. No proximal occlusion or embolus identified within the proximal M2 segments.  Posterior circulation: The left vertebral artery is dominant mild to moderate multi focal stenoses. The diminutive right vertebral artery widely patent. Vertebrobasilar junction normal. Posterior inferior cerebral arteries patent bilaterally. Atheromatous irregularity present within the proximal basilar artery without significant stenosis or occlusion. Superior cerebellar arteries are patent bilaterally. Posterior cerebral arteries opacified bilaterally. No arterial branch occlusion upper significant stenosis identified within the intracranial circulation.  Venous sinuses: No acute abnormality identified within the venous sinuses.  Anatomic variants: No anatomic variant.  No aneurysm.  Delayed phase:  No abnormal enhancement on delayed sequences.  IMPRESSION: CTA NECK IMPRESSION:  1. No acute abnormality within the major arterial vasculature of the neck. 2. Multifocal atheromatous plaque about the carotid bifurcations/proximal internal carotid arteries bilaterally. There is associated short-segment stenosis of approximately 60% at the right carotid bifurcation, with short-segment stenosis of approximately 50-60% at the left carotid bifurcation.  CTA HEAD IMPRESSION:  1. No proximal branch occlusion or hemodynamically significant stenosis identified within the intracranial circulation. 2. Diminutive right A2 segment, likely related to prior right ACA infarct. 3. Mild multi focal atheromatous plaque within the cavernous carotid arteries bilaterally without significant stenosis. 4. Mild multi focal atherosclerotic irregularity and stenosis within the distal left V4 segment.   Electronically Signed   By: Rise Mu M.D.   On:  08/08/2014 23:06   Dg Chest Portable 1 View  08/08/2014   CLINICAL DATA:  Hypoxia  EXAM: PORTABLE CHEST - 1 VIEW  COMPARISON:  None.  FINDINGS: Endotracheal tube tip is 3.7 cm above the carina. No pneumothorax. Lungs are clear. Heart size and pulmonary vascularity are normal. No adenopathy. No bone lesions.  IMPRESSION: Endotracheal tube as described without pneumothorax. No edema or consolidation.   Electronically Signed   By: Bretta Bang III M.D.   On: 08/08/2014 21:03     ASSESSMENT / PLAN:  PULMONARY OETT 4/13 > A: Acute hypoxemic respiratory failure in setting AMS, suspect med related  P:   Plan for extubation am 4/14 VAP bundle Bronchodilators  CARDIOVASCULAR A:  Hypotension, improved with decrease in propofol H/o HTN P:  Tele Continue statin, asa Hold ACEi, amlodipine, atenolol, restart when able to take PO PRN metoprolol for tachycardia PRN hydralazine for HTN  RENAL A:   Renal insufficiency  P:   Hold ACE-i IVF hydration Follow BMP  GASTROINTESTINAL A:   GERD  P:   Continue home PPI  NPO  HEMATOLOGIC A:   No acute issues  P:  Heparin for VTE ppx Follow CBC  INFECTIOUS A:   No acute issues  P:   No abx at this time, follow WBC and temp  ENDOCRINE A:   DM    P:   CBG monitoring and SSI Holding metformin  NEUROLOGIC A:   Acute encephalopathy, suspect polypharmacy induced. 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

## 2014-08-09 NOTE — H&P (Addendum)
PULMONARY / CRITICAL CARE MEDICINE   Name: Terrance HalimJames Kishbaugh MRN: [id number] DOB: 12-29-1955    ADMISSION DATE:  08/08/2014 CONSULTATION DATE:  08/08/2014  REFERRING MD :  Roseanne RenoStewart  CHIEF COMPLAINT:  AMS  INITIAL PRESENTATION: 59 year old male [facility] with AMS, admitted to neurology with possible stroke. In ED was intubated due to periods of apnea. To ICU, PCCM to assist with medical management.   STUDIES:  CT head 4/13 > No acute intracranial abnormalities identified. Small vessel ischemic change and brain atrophy. 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 CTA head 4/13 >. No acute abnormality within the major arterial vasculature of the Neck. Multifocal atheromatous plaque about the carotid bifurcations/proximal internal carotid arteries bilaterally. There is associated short-segment stenosis of approximately 60% at the right carotid bifurcation, with short-segment stenosis of approximately 50-60% at the left carotid bifurcation.  SIGNIFICANT EVENTS:   HISTORY OF PRESENT ILLNESS:  59 year old male with PMH CVA, CAD, DM, and HTN [facility] with AMS. He was stuporous and having some L sided weakness. Last seen normal 1500. Neurology evaluated him in ED and ordered CT head, which showed no acute abnormality but remote ACA infarct. He had labored respirations and several periods of apnea so was intubated for airway protection in ED. He is on multiple medications for pain, seizures, and HTN and is from SNF. He was admitted to ICU for ventilator support and monitoring. PCCM to assist with medical management.   PAST MEDICAL HISTORY :   has a past medical history of Stroke; Coronary artery disease; History of cardiac cath; Diabetes mellitus without complication; and Hypertension.  has past surgical history that includes Cardiac surgery and Femur fracture surgery. Prior to Admission medications   Medication Sig Start Date End Date Taking? Authorizing Provider  amLODipine (NORVASC) 5  MG tablet Take 10 mg by mouth daily.   Yes Historical Provider, MD  aspirin 81 MG chewable tablet Chew 81 mg by mouth daily.   Yes Historical Provider, MD  atenolol (TENORMIN) 50 MG tablet Take 50 mg by mouth 2 (two) times daily.   Yes Historical Provider, MD  atorvastatin (LIPITOR) 20 MG tablet Take 20 mg by mouth daily.   Yes Historical Provider, MD  baclofen (LIORESAL) 10 MG tablet Take 10 mg by mouth 3 (three) times daily.   Yes Historical Provider, MD  benazepril (LOTENSIN) 40 MG tablet Take 40 mg by mouth daily.   Yes Historical Provider, MD  buprenorphine-naloxone (SUBOXONE) 8-2 MG SUBL SL tablet Place 1 tablet under the tongue every 8 (eight) hours. Take for 30 days. Started on 08-07-14   Yes Historical Provider, MD  diazepam (VALIUM) 5 MG tablet Take 5 mg by mouth 3 (three) times daily.   Yes Historical Provider, MD  DULoxetine (CYMBALTA) 60 MG capsule Take 60 mg by mouth daily.   Yes Historical Provider, MD  etodolac (LODINE) 300 MG capsule Take 300 mg by mouth 3 (three) times daily.   Yes Historical Provider, MD  febuxostat (ULORIC) 40 MG tablet Take 40 mg by mouth daily.   Yes Historical Provider, MD  fenofibrate 160 MG tablet Take 160 mg by mouth daily.   Yes Historical Provider, MD  furosemide (LASIX) 20 MG tablet Take 20 mg by mouth daily.   Yes Historical Provider, MD  gabapentin (NEURONTIN) 800 MG tablet Take 800 mg by mouth 2 (two) times daily.    Yes Historical Provider, MD  HYDROcodone-acetaminophen (NORCO/VICODIN) 5-325 MG per tablet Take 1  tablet by mouth every 6 (six) hours as needed for moderate pain.   Yes Historical Provider, MD  hydrOXYzine (ATARAX/VISTARIL) 50 MG tablet Take 50 mg by mouth every 6 (six) hours as needed for anxiety.   Yes Historical Provider, MD  lamoTRIgine (LAMICTAL) 25 MG tablet Take 25 mg by mouth daily.    Yes Historical Provider, MD  levETIRAcetam (KEPPRA) 500 MG tablet Take 1,000 mg by mouth 2 (two) times daily.   Yes Historical Provider, MD   lidocaine (LIDODERM) 5 % Place 1 patch onto the skin daily. Remove & Discard patch within 12 hours or as directed by MD   Yes Historical Provider, MD  loperamide (IMODIUM A-D) 2 MG tablet Take 2 mg by mouth 4 (four) times daily as needed for diarrhea or loose stools.   Yes Historical Provider, MD  metFORMIN (GLUCOPHAGE) 1000 MG tablet Take 1,000 mg by mouth 2 (two) times daily with a meal.   Yes Historical Provider, MD  nicotine (NICODERM CQ - DOSED IN MG/24 HOURS) 21 mg/24hr patch Place 21 mg onto the skin daily.   Yes Historical Provider, MD  nitroGLYCERIN (NITROSTAT) 0.4 MG SL tablet Place 0.4 mg under the tongue every 5 (five) minutes as needed for chest pain.   Yes Historical Provider, MD  omeprazole (PRILOSEC) 20 MG capsule Take 20 mg by mouth daily.   Yes Historical Provider, MD  oxybutynin (DITROPAN-XL) 5 MG 24 hr tablet Take 5 mg by mouth at bedtime.   Yes Historical Provider, MD  polyethylene glycol (MIRALAX / GLYCOLAX) packet Take 17 g by mouth daily.   Yes Historical Provider, MD  QUEtiapine (SEROQUEL) 25 MG tablet Take 25 mg by mouth at bedtime.   Yes Historical Provider, MD  diclofenac sodium (VOLTAREN) 1 % GEL Apply 2 g topically 4 (four) times daily. Apply to hip    Historical Provider, MD  oxyCODONE-acetaminophen (PERCOCET/ROXICET) 5-325 MG per tablet Take 2 tablets by mouth every 6 (six) hours as needed for severe pain. Patient not taking: Reported on 08/08/2014 06/20/14   Arby Barrette, MD   No Known Allergies  FAMILY HISTORY:  has no family status information on file.  SOCIAL HISTORY:  reports that he has been smoking Cigarettes.  He has been smoking about 0.50 packs per day. He has never used smokeless tobacco. He reports that he does not drink alcohol or use illicit drugs.  REVIEW OF SYSTEMS:  unable SUBJECTIVE:   VITAL SIGNS: Temp:  [98 F (36.7 C)-101.1 F (38.4 C)] 99.3 F (37.4 C) (04/14 1100) Pulse Rate:  [51-78] 73 (04/14 1100) Resp:  [9-24] 13 (04/14  1100) BP: (84-201)/(49-93) 94/61 mmHg (04/14 1100) SpO2:  [88 %-100 %] 93 % (04/14 1100) FiO2 (%):  [40 %-100 %] 40 % (04/14 1000) Weight:  [113 kg (249 lb 1.9 oz)] 113 kg (249 lb 1.9 oz) (04/13 2244) HEMODYNAMICS:   VENTILATOR SETTINGS: Vent Mode:  [-] PRVC FiO2 (%):  [40 %-100 %] 40 % Set Rate:  [18 bmp-20 bmp] 20 bmp Vt Set:  [550 mL] 550 mL PEEP:  [5 cmH20] 5 cmH20 Plateau Pressure:  [17 cmH20-20 cmH20] 20 cmH20 INTAKE / OUTPUT:  Intake/Output Summary (Last 24 hours) at 08/09/14 1302 Last data filed at 08/09/14 1100  Gross per 24 hour  Intake  602.9 ml  Output   1150 ml  Net -547.1 ml    PHYSICAL EXAMINATION: General:  Obese male in NAD on vent Neuro:  RASS -2. alert to verbal.  HEENT:  [place]/AT, [place]  pupils Cardiovascular:  RRR, no MRG Lungs:  Clear bilateral breath sounds Abdomen:  Soft, non-distended Musculoskeletal:  No acute deformity Skin:  Grossly intact  LABS:  CBC  Recent Labs Lab 08/08/14 1905 08/08/14 1912 08/09/14 0305  WBC 7.2  --  7.3  HGB 13.0 15.0 13.3  HCT 39.6 44.0 39.6  PLT 247  --  107*   Coag's  Recent Labs Lab 08/08/14 1905  APTT 30  INR 1.19   BMET  Recent Labs Lab 08/08/14 1905 08/08/14 1912 08/09/14 0305  NA 134* 135 138  K 4.9 4.8 4.4  CL 97 98 101  CO2 24  --  21  BUN 30* 38* 30*  CREATININE 1.70* 1.70* 1.32  GLUCOSE 114* 112* 94   Electrolytes  Recent Labs Lab 08/08/14 1905 08/09/14 0305  CALCIUM 9.6 9.6   Sepsis Markers No results for input(s): LATICACIDVEN, PROCALCITON, O2SATVEN in the last 168 hours. ABG  Recent Labs Lab 08/08/14 1951 08/08/14 2119  PHART 7.200* 7.324*  PCO2ART 69.6* 53.4*  PO2ART 105.0* 351.0*   Liver Enzymes  Recent Labs Lab 08/08/14 1905  AST 84*  ALT 62*  ALKPHOS 22*  BILITOT 0.8  ALBUMIN 3.9   Cardiac Enzymes No results for input(s): TROPONINI, PROBNP in the last 168 hours. Glucose  Recent Labs Lab 08/08/14 1924 08/09/14 0002 08/09/14 0341  08/09/14 0809 08/09/14 1157  GLUCAP 114* 87 94 115* 128*    Imaging Ct Angio Head W/cm &/or Wo Cm  08/08/2014   EXAM: CT ANGIOGRAPHY HEAD AND NECK  TECHNIQUE: Multidetector CT imaging of the head and neck was performed using the standard protocol during bolus administration of intravenous contrast. Multiplanar CT image reconstructions and MIPs were obtained to evaluate the vascular anatomy. Carotid stenosis measurements (when applicable) are obtained utilizing NASCET criteria, using the distal internal carotid diameter as the denominator.  COMPARISON:  None.  FINDINGS: CTA NECK  Aortic arch: Visualize aortic arch of normal caliber. Scattered calcified atheromatous plaque present within the aortic arch in at the origin of the great vessels, particularly the left subclavian artery. No high-grade stenosis seen at the origin of the great vessels. Visualized subclavian arteries widely patent.  Right carotid system: Right common carotid artery well opacified from its origin to the carotid bifurcation. Calcified and noncalcified plaque present about the right carotid bifurcation. There is associated short-segment stenosis of approximately 60% by NASCET criteria about the right carotid bifurcation. Distally, the right internal carotid artery is well opacified to the skullbase without stenosis, occlusion, or dissection.  Left carotid system: Left common carotid artery well opacified from its origin to the carotid bifurcation. There is calcified and noncalcified plaque about the left carotid bifurcation extending into the proximal left internal carotid artery. There is associated irregular short segment stenosis of approximately 50-60% by NASCET criteria within the proximal left ICA. Focal contrast filled outpouching extending from the posterior aspect of the proximal left ICA near the area of greatest stenosis likely reflects a small penetrating atheromatous plaque. This area of atheromatous stenosis and irregularity  extends from the left carotid bifurcation, and measures approximately 15 mm in length. Distally, the left ICA is well opacified to the skullbase without stenosis, dissection, or occlusion.  Vertebral arteries:Both vertebral arteries arise from the subclavian arteries. The left vertebral artery is dominant. Vertebral arteries are well opacified along their entire course without evidence for dissection, occlusion, or stenosis. There is focal calcified plaque at the origin of the right vertebral artery. Mild atheromatous plaque present within  the distal left vertebral artery as it courses into the cranial vault.  Skeleton: Vertebral bodies are normally aligned with preservation of the normal cervical lordosis. Mild degenerative disc disease present at C5-6. No acute osseous abnormality. No worrisome lytic or blastic osseous lesion.  Other neck: No acute soft tissue abnormality within the neck. No adenopathy. Visualized superior mediastinum within normal limits. Mild atelectatic changes seen dependently within the visualized lung bases. Endotracheal and enteric tubes in place. Fluid density within the nasopharynx likely related intubation.  CTA HEAD  Anterior circulation: The petrous segments of the internal carotid arteries are widely patent bilaterally. Scattered mild multi focal atheromatous plaque present within the cavernous carotid arteries without significant stenosis. Supraclinoid segments widely patent. A1 segments and anterior communicating artery are widely patent. The anterior cerebral arteries are patent proximally, although the right A2 segment is diminutive, likely related to prior right ACA infarct.  M1 segments patent bilaterally without proximal branch occlusion or significant stenosis. MCA branches are fairly symmetric bilaterally. No proximal occlusion or embolus identified within the proximal M2 segments.  Posterior circulation: The left vertebral artery is dominant mild to moderate multi focal  stenoses. The diminutive right vertebral artery widely patent. Vertebrobasilar junction normal. Posterior inferior cerebral arteries patent bilaterally. Atheromatous irregularity present within the proximal basilar artery without significant stenosis or occlusion. Superior cerebellar arteries are patent bilaterally. Posterior cerebral arteries opacified bilaterally. No arterial branch occlusion upper significant stenosis identified within the intracranial circulation.  Venous sinuses: No acute abnormality identified within the venous sinuses.  Anatomic variants: No anatomic variant.  No aneurysm.  Delayed phase:  No abnormal enhancement on delayed sequences.  IMPRESSION: CTA NECK IMPRESSION:  1. No acute abnormality within the major arterial vasculature of the neck. 2. Multifocal atheromatous plaque about the carotid bifurcations/proximal internal carotid arteries bilaterally. There is associated short-segment stenosis of approximately 60% at the right carotid bifurcation, with short-segment stenosis of approximately 50-60% at the left carotid bifurcation.  CTA HEAD IMPRESSION:  1. No proximal branch occlusion or hemodynamically significant stenosis identified within the intracranial circulation. 2. Diminutive right A2 segment, likely related to prior right ACA infarct. 3. Mild multi focal atheromatous plaque within the cavernous carotid arteries bilaterally without significant stenosis. 4. Mild multi focal atherosclerotic irregularity and stenosis within the distal left V4 segment.   Electronically Signed   By: Rise Mu M.D.   On: 08/08/2014 23:06   Ct Head Wo Contrast  08/08/2014   CLINICAL DATA:  Stroke.  Left-sided weakness.  EXAM: CT HEAD WITHOUT CONTRAST  TECHNIQUE: Contiguous axial images were obtained from the base of the skull through the vertex without intravenous contrast.  COMPARISON:  06/19/2014  FINDINGS: There is mild diffuse low-attenuation within the subcortical and periventricular  white matter compatible with chronic microvascular disease. Large area of encephalomalacia in the distribution of the right ACA territory is again identified compatible with chronic infarct. There is no evidence for acute stroke, intracranial hemorrhage or mass. The paranasal sinuses are clear. The mastoid air cells are also clear. The calvarium is intact.  IMPRESSION: 1. No acute intracranial abnormalities identified. 2. Small vessel ischemic change and brain atrophy. 3. 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   Electronically Signed   By: Signa Kell M.D.   On: 08/08/2014 19:38   Ct Angio Neck W/cm &/or Wo/cm  08/08/2014   EXAM: CT ANGIOGRAPHY HEAD AND NECK  TECHNIQUE: Multidetector CT imaging of the head and neck was performed using the standard protocol during bolus administration of intravenous  contrast. Multiplanar CT image reconstructions and MIPs were obtained to evaluate the vascular anatomy. Carotid stenosis measurements (when applicable) are obtained utilizing NASCET criteria, using the distal internal carotid diameter as the denominator.  COMPARISON:  None.  FINDINGS: CTA NECK  Aortic arch: Visualize aortic arch of normal caliber. Scattered calcified atheromatous plaque present within the aortic arch in at the origin of the great vessels, particularly the left subclavian artery. No high-grade stenosis seen at the origin of the great vessels. Visualized subclavian arteries widely patent.  Right carotid system: Right common carotid artery well opacified from its origin to the carotid bifurcation. Calcified and noncalcified plaque present about the right carotid bifurcation. There is associated short-segment stenosis of approximately 60% by NASCET criteria about the right carotid bifurcation. Distally, the right internal carotid artery is well opacified to the skullbase without stenosis, occlusion, or dissection.  Left carotid system: Left common carotid artery well opacified from its origin to the carotid  bifurcation. There is calcified and noncalcified plaque about the left carotid bifurcation extending into the proximal left internal carotid artery. There is associated irregular short segment stenosis of approximately 50-60% by NASCET criteria within the proximal left ICA. Focal contrast filled outpouching extending from the posterior aspect of the proximal left ICA near the area of greatest stenosis likely reflects a small penetrating atheromatous plaque. This area of atheromatous stenosis and irregularity extends from the left carotid bifurcation, and measures approximately 15 mm in length. Distally, the left ICA is well opacified to the skullbase without stenosis, dissection, or occlusion.  Vertebral arteries:Both vertebral arteries arise from the subclavian arteries. The left vertebral artery is dominant. Vertebral arteries are well opacified along their entire course without evidence for dissection, occlusion, or stenosis. There is focal calcified plaque at the origin of the right vertebral artery. Mild atheromatous plaque present within the distal left vertebral artery as it courses into the cranial vault.  Skeleton: Vertebral bodies are normally aligned with preservation of the normal cervical lordosis. Mild degenerative disc disease present at C5-6. No acute osseous abnormality. No worrisome lytic or blastic osseous lesion.  Other neck: No acute soft tissue abnormality within the neck. No adenopathy. Visualized superior mediastinum within normal limits. Mild atelectatic changes seen dependently within the visualized lung bases. Endotracheal and enteric tubes in place. Fluid density within the nasopharynx likely related intubation.  CTA HEAD  Anterior circulation: The petrous segments of the internal carotid arteries are widely patent bilaterally. Scattered mild multi focal atheromatous plaque present within the cavernous carotid arteries without significant stenosis. Supraclinoid segments widely patent. A1  segments and anterior communicating artery are widely patent. The anterior cerebral arteries are patent proximally, although the right A2 segment is diminutive, likely related to prior right ACA infarct.  M1 segments patent bilaterally without proximal branch occlusion or significant stenosis. MCA branches are fairly symmetric bilaterally. No proximal occlusion or embolus identified within the proximal M2 segments.  Posterior circulation: The left vertebral artery is dominant mild to moderate multi focal stenoses. The diminutive right vertebral artery widely patent. Vertebrobasilar junction normal. Posterior inferior cerebral arteries patent bilaterally. Atheromatous irregularity present within the proximal basilar artery without significant stenosis or occlusion. Superior cerebellar arteries are patent bilaterally. Posterior cerebral arteries opacified bilaterally. No arterial branch occlusion upper significant stenosis identified within the intracranial circulation.  Venous sinuses: No acute abnormality identified within the venous sinuses.  Anatomic variants: No anatomic variant.  No aneurysm.  Delayed phase:  No abnormal enhancement on delayed sequences.  IMPRESSION: CTA NECK  IMPRESSION:  1. No acute abnormality within the major arterial vasculature of the neck. 2. Multifocal atheromatous plaque about the carotid bifurcations/proximal internal carotid arteries bilaterally. There is associated short-segment stenosis of approximately 60% at the right carotid bifurcation, with short-segment stenosis of approximately 50-60% at the left carotid bifurcation.  CTA HEAD IMPRESSION:  1. No proximal branch occlusion or hemodynamically significant stenosis identified within the intracranial circulation. 2. Diminutive right A2 segment, likely related to prior right ACA infarct. 3. Mild multi focal atheromatous plaque within the cavernous carotid arteries bilaterally without significant stenosis. 4. Mild multi focal  atherosclerotic irregularity and stenosis within the distal left V4 segment.   Electronically Signed   By: Rise MuBenjamin  McClintock M.D.   On: 08/08/2014 23:06   Dg Chest Portable 1 View  08/08/2014   CLINICAL DATA:  Hypoxia  EXAM: PORTABLE CHEST - 1 VIEW  COMPARISON:  None.  FINDINGS: Endotracheal tube tip is 3.7 cm above the carina. No pneumothorax. Lungs are clear. Heart size and pulmonary vascularity are normal. No adenopathy. No bone lesions.  IMPRESSION: Endotracheal tube as described without pneumothorax. No edema or consolidation.   Electronically Signed   By: Bretta BangWilliam  Woodruff III M.D.   On: 08/08/2014 21:03     ASSESSMENT / PLAN:  PULMONARY OETT 4/13 > A: Acute hypoxemic respiratory failure in setting AMS  P:   Full vent support VAP bundle Bronchodilators  CARDIOVASCULAR A:  Hypotension H/o HTN P:  Tele Continue statin, asa Hold ACEi, amlodipine, atenolol PRN metoprolol for tachycardia PRN hydralazine for HTN  RENAL A:   Renal insufficiency  P:   Hold ACE IVF hydration BMET in AM  GASTROINTESTINAL A:   GERD  P:   Continue home PPI  NPO  HEMATOLOGIC A:   No acute issues  P:  Heparin for VTE ppx CBC in am  INFECTIOUS A:   No acute issues  P:   Monitor for    ENDOCRINE A:   DM    P:   CBG monitoring and SSI Holding metformin  NEUROLOGIC A:   Acute encephalopathy, suspect polypharmacy induced. Seizure disorder Chronic pain ?CVA P:   RASS goal: 0/-1 Propofol Continue home valium, gabapentin (reduced dose), Lamictal, keppra Neuro checks Neurology following  FAMILY  - Updates:   - Inter-disciplinary family meet or Palliative Care meeting due by:  4/20   Joneen RoachPaul Hoffman, AGACNP-BC [place] [facility] Pager [phone] or [phone]  08/09/2014 1:02 PM  PCCM ATTENDING: I have reviewed pt's initial presentation, consultants notes and hospital database in detail.  The above assessment and plan was formulated  under my direction.  In summary: This relatively young man is chronically ill, SNF resident who takes a multitude of medications. His AMS is not presently well explained by CT scan findings. He was intubated due to AMS. He should be extubated once his cognition permits. The care plan above and reflected in the orders was formulated by me    Billy Fischeravid Amanat Hackel, MD;  PCCM service; Mobile [phone]

## 2014-08-09 NOTE — Progress Notes (Signed)
ANTIBIOTIC CONSULT NOTE - INITIAL  Pharmacy Consult for Vancomycin Indication: empiric coverage for temp to 102.3  No Known Allergies  Patient Measurements: Height: 6\' 1"  (185.4 cm) Weight: 249 lb 1.9 oz (113 kg) (from february ) IBW/kg (Calculated) : 79.9  Vital Signs: Temp: 102.2 F (39 C) (04/14 1900) BP: 136/73 mmHg (04/14 1900) Pulse Rate: 87 (04/14 1900) Intake/Output from previous day: 04/13 0701 - 04/14 0700 In: 392.1 [I.V.:342.1; IV Piggyback:50] Out: 1150 [Urine:1100; Emesis/NG output:50]  Labs:  Recent Labs  08/08/14 1905 08/08/14 1912 08/09/14 0305  WBC 7.2  --  7.3  HGB 13.0 15.0 13.3  PLT 247  --  107*  CREATININE 1.70* 1.70* 1.32   Estimated Creatinine Clearance: 82.3 mL/min (by C-G formula based on Cr of 1.32).  Medical History: Past Medical History  Diagnosis Date  . Stroke   . Coronary artery disease   . History of cardiac cath   . Diabetes mellitus without complication   . Hypertension    Assessment:   59 yr old male admitted 08/08/14 pm with AMS/possible stroke. Intubated in the ED due to periods of apnea.  MRI showed no acute infarct.  Neuro notes AMS likely due to hypercapnia.  Temp to 102.6 tonight. Blood and respiratory cultures ordered. To begin Vancomycin and Zosyn.   Creatinine 1.7 on admit, 1.32 today.  WBC 7.3  Goal of Therapy:  Vancomycin trough level 15-20 mcg/ml  Plan:   Vancomycin 1 gram IV q12hrs.  Zosyn 3.375 gm IV over 30 min x 1, then q8hrs (each infused over 4 hours).   Will follow renal function, culture data and clinical progress.  Dennie Fettersgan, Tekesha Almgren [place], [place] Pager: [phone] 08/09/2014,7:20 PM

## 2014-08-09 NOTE — Progress Notes (Signed)
eLink Physician-Brief Progress Note Patient Name: Terrance HalimJames Grider DOB: 03-18-1956 MRN: [id number]   Date of Service  08/09/2014  HPI/Events of Note  Temp spike to 102.6 F.  eICU Interventions  Will order: 1. Blood cultures X 2. 2. UA with reflex culture. 3. Sputum for gram stain and C&S.     Intervention Category Intermediate Interventions: Infection - evaluation and management  Zaraya Delauder [name] 08/09/2014, 6:06 PM

## 2014-08-09 NOTE — Procedures (Signed)
Extubation Procedure Note  Patient Details:   Name: Jacinto HalimJames Savarino DOB: 1956-02-06 MRN: [id number]   Airway Documentation:     Evaluation  O2 sats: stable throughout Complications: No apparent complications Patient did tolerate procedure well. Bilateral Breath Sounds: Clear, Diminished Suctioning: Oral, Airway Yes   Pt extubated to 3L [place] but increased to 4L  due to spo2 90%. Pt having increased amounts of very thick secretions, given yankauer to aid in mobilization. Pt encouraged to cough and take deep breaths. Pt stable throughout with no complications. Pt has diminished BS. RT will continue to monitor.   Carolan ShiverKelley, Randi Poullard M 08/09/2014, 10:39 AM

## 2014-08-09 NOTE — Progress Notes (Signed)
Spoke with Dr. Arsenio LoaderSommer regarding changing patient's PO meds to alternative means. Instructed to have patient try sip of water since mentation/alertness had improved since SLP evaluation. Patient had no difficulties. Order received to give PO medications with water. SLP to eval for diet again in morning.

## 2014-08-09 NOTE — Progress Notes (Signed)
eLink Physician-Brief Progress Note Patient Name: Terrance HalimJames Cook DOB: [birth year] MRN: [id number]   Date of Service  08/09/2014  HPI/Events of Note  Psychosis, WD likely, MRI neg cva  eICU Interventions  Precedex, prn fent, prn versed     Intervention Category Major Interventions: Delirium, psychosis, severe agitation - evaluation and management  Tavonna Worthington J. 08/09/2014, 11:17 PM

## 2014-08-09 NOTE — Progress Notes (Signed)
UR completed.  [facility] CSW aware. Potential to return there when medically stable if patient can manage at that level (ALF).  Carlyle LipaMichelle Arlynn Mcdermid, RN BSN MHA CCM Trauma/Neuro ICU Case Manager [phone]

## 2014-08-09 NOTE — Progress Notes (Addendum)
STROKE TEAM PROGRESS NOTE   SUBJECTIVE (INTERVAL HISTORY) No family is at the bedside.  Overall he feels his condition is gradually improving. He is awake alert and following commands, still intubated.   OBJECTIVE Temp:  [98 F (36.7 C)-101.1 F (38.4 C)] 99.3 F (37.4 C) (04/14 1100) Pulse Rate:  [51-78] 73 (04/14 1100) Cardiac Rhythm:  [-] Normal sinus rhythm (04/14 0800) Resp:  [9-24] 13 (04/14 1100) BP: (84-201)/(49-93) 94/61 mmHg (04/14 1100) SpO2:  [88 %-100 %] 93 % (04/14 1100) FiO2 (%):  [40 %-100 %] 40 % (04/14 1000) Weight:  [249 lb 1.9 oz (113 kg)] 249 lb 1.9 oz (113 kg) (04/13 2244)   Recent Labs Lab 08/08/14 1924 08/09/14 0002 08/09/14 0341 08/09/14 0809 08/09/14 1157  GLUCAP 114* 87 94 115* 128*    Recent Labs Lab 08/08/14 1905 08/08/14 1912 08/09/14 0305  NA 134* 135 138  K 4.9 4.8 4.4  CL 97 98 101  CO2 24  --  21  GLUCOSE 114* 112* 94  BUN 30* 38* 30*  CREATININE 1.70* 1.70* 1.32  CALCIUM 9.6  --  9.6    Recent Labs Lab 08/08/14 1905  AST 84*  ALT 62*  ALKPHOS 22*  BILITOT 0.8  PROT 7.0  ALBUMIN 3.9    Recent Labs Lab 08/08/14 1905 08/08/14 1912 08/09/14 0305  WBC 7.2  --  7.3  NEUTROABS 3.6  --   --   HGB 13.0 15.0 13.3  HCT 39.6 44.0 39.6  MCV 86.3  --  85.7  PLT 247  --  107*   No results for input(s): CKTOTAL, CKMB, CKMBINDEX, TROPONINI in the last 168 hours.  Recent Labs  08/08/14 1905  LABPROT 15.2  INR 1.19    Recent Labs  08/08/14 2102  COLORURINE YELLOW  LABSPEC 1.015  PHURINE 5.0  GLUCOSEU NEGATIVE  HGBUR NEGATIVE  BILIRUBINUR LARGE*  KETONESUR NEGATIVE  PROTEINUR NEGATIVE  UROBILINOGEN 0.2  NITRITE NEGATIVE  LEUKOCYTESUR NEGATIVE       Component Value Date/Time   CHOL 135 08/09/2014 1132   TRIG 380* 08/09/2014 1132   HDL 21* 08/09/2014 1132   CHOLHDL 6.4 08/09/2014 1132   VLDL 76* 08/09/2014 1132   LDLCALC 38 08/09/2014 1132   No results found for: HGBA1C    Component Value Date/Time    LABOPIA POSITIVE* 08/08/2014 2102   COCAINSCRNUR NONE DETECTED 08/08/2014 2102   LABBENZ POSITIVE* 08/08/2014 2102   AMPHETMU NONE DETECTED 08/08/2014 2102   THCU POSITIVE* 08/08/2014 2102   LABBARB NONE DETECTED 08/08/2014 2102     Recent Labs Lab 08/08/14 1905  ETH <5    I have personally reviewed the radiological images below and agree with the radiology interpretations.   Ct Head Wo Contrast  08/08/2014   IMPRESSION: 1. No acute intracranial abnormalities identified. 2. Small vessel ischemic change and brain atrophy. 3. 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     Ct Angio head and Neck W/cm &/or Wo/cm  08/08/2014  IMPRESSION: CTA NECK IMPRESSION:  1. No acute abnormality within the major arterial vasculature of the neck. 2. Multifocal atheromatous plaque about the carotid bifurcations/proximal internal carotid arteries bilaterally. There is associated short-segment stenosis of approximately 60% at the right carotid bifurcation, with short-segment stenosis of approximately 50-60% at the left carotid bifurcation.  CTA HEAD IMPRESSION:  1. No proximal branch occlusion or hemodynamically significant stenosis identified within the intracranial circulation. 2. Diminutive right A2 segment, likely related to prior right ACA infarct. 3. Mild multi focal atheromatous  plaque within the cavernous carotid arteries bilaterally without significant stenosis. 4. Mild multi focal atherosclerotic irregularity and stenosis within the distal left V4 segment.     Dg Chest Port 1 View  08/09/2014   IMPRESSION: Tube positions as described without pneumothorax. Atelectasis left base. Lungs otherwise clear. No change in cardiac silhouette.     08/08/2014   IMPRESSION: Endotracheal tube as described without pneumothorax. No edema or consolidation.     MRI - pending  2D Echocardiogram  pending   PHYSICAL EXAM  Temp:  [98 F (36.7 C)-101.1 F (38.4 C)] 99.3 F (37.4 C) (04/14 1100) Pulse Rate:  [51-78] 73  (04/14 1100) Resp:  [9-24] 13 (04/14 1100) BP: (84-201)/(49-93) 94/61 mmHg (04/14 1100) SpO2:  [88 %-100 %] 93 % (04/14 1100) FiO2 (%):  [40 %-100 %] 40 % (04/14 1000) Weight:  [249 lb 1.9 oz (113 kg)] 249 lb 1.9 oz (113 kg) (04/13 2244)  General - morbid obesity, well developed, intubated on sedation.  Ophthalmologic - not cooperative on exam.  Cardiovascular - Regular rhythm with no murmur, but tachycardia.  Neuro - intubated on low dose sedation, open eyes on voice, follows simple commands centrally and bilateral peripherally. PERRL, blinking to visual threat bilaterally. Corneal, gag and cough reflex present. Moving bilateral arm equally and spontaneously. RLE spontaneous movement, 3/5, but LLE 1/5 on pain stimulation. Reflex 1+ and no babinski. Sensation, coordination and gait not tested.  ASSESSMENT/Cook [name] is a 59 y.o. male with history of right ACA stroke with LLE weakness, CAD, DM, HTN, seizure disorder on keppra admitted for AMS and worsening left LE weakness. Found to have hypercapnia and respiratory acidosis, intubated for airway protection. Symptoms improving after putting on vent.    Respiratory acidosis with recrudescence of old right ACA stroke  MRI  pending  CTA head and neck - no acute abnormalities except old right ACA stroke. No large vessel occlusion  2D Echo  pending  LDL 38  HgbA1c pending  Heparin subq for VTE prophylaxis  no antithrombotic prior to admission, now on aspirin 325 mg orally every day  Ongoing aggressive stroke risk factor management  Therapy recommendations:  pending  Disposition:  Pending  Respiratory failure with respiratory acidosis  CCM on board  On ventilation  Likely the etiology for AMS  Chronic right ACA stroke  Seen on CT head  Residue LLE weakness  Likely recrudescence due to respiratory failure.  Diabetes  HgbA1c pending goal < 7.0  Controlled  Home meds metformin  CBG  monitoring  SSI  Hypertension  Home meds:   Norvasc, atenolol, lasix,  BP goal normotensive Hydralazine and metoprolol IV PRN  Stable  Hyperlipidemia  Home meds:  lipitor   Currently on lipitor  LDL 38, goal < 70  Continue statin at discharge  Seizure disorder  On keppra  Continue keppra home dose  Hold off EEG. If AMS does not improve, then consider EEG  Other Stroke Risk Factors  Obesity, Body mass index is 32.87 kg/(m^2).   Hx stroke/TIA  Coronary artery disease   Other Active Problems  High TG    Other Pertinent History  intubated  Hospital day # 1  This patient is critically ill due to respiratory failure requiring intubation, respiratory acidosis and at significant risk of neurological worsening, death form respiratory failure, cardiac arrest, seizure. This patient's care requires constant monitoring of vital signs, hemodynamics, respiratory and cardiac monitoring, review of multiple databases, neurological assessment, discussion with family, other specialists and  medical decision making of high complexity. I spent 40 minutes of neurocritical care time in the care of this patient.   Terrance Plan, MD PhD Stroke Neurology 08/09/2014 2:17 PM  Addendum  MRI brain done showed no acute infarct, except old right ACA infarct. Etiology for his altered mental status most likely due to hypercapnia, and his worsening left loweR extremity weakness is likely due to recrudescence of old right ACA infarct.  Neurology will sign off. Please call with questions. Thanks for the consult.  Terrance Plan, MD PhD Stroke Neurology 08/09/2014 6:39 PM   To contact Stroke Continuity provider, please refer to [email or web address]. After hours, contact General Neurology

## 2014-08-09 NOTE — Progress Notes (Signed)
Patient has been confused throughout day, but around 2245, patient became agitated and began pulling equipment off. Patient placed on sitter list. Upon sitters arrival patient became combative, hitting staff, verbally abusive, CCM MD, Tyson AliasFeinstein cameras in. Security called and responded. Restraints applied. Orders received for medications. Patient on precedex drip. Sitter at bedside. Will monitor.

## 2014-08-09 NOTE — Evaluation (Signed)
Clinical/Bedside Swallow Evaluation Patient Details  Name: Terrance Cook MRN: [id number] Date of Birth: 1956-01-06  Today's Date: 08/09/2014 Time: SLP Start Time (ACUTE ONLY): 1345 SLP Stop Time (ACUTE ONLY): 1400 SLP Time Calculation (min) (ACUTE ONLY): 15 min  Past Medical History:  Past Medical History  Diagnosis Date  . Stroke   . Coronary artery disease   . History of cardiac cath   . Diabetes mellitus without complication   . Hypertension    Past Surgical History:  Past Surgical History  Procedure Laterality Date  . Cardiac surgery    . Femur fracture surgery     HPI:  59 year old [facility] with AMS, left sided weakness, stupourous. admitted to neurology with possible stroke. In ED was intubated due to periods of apnea. Head CT showed no acute abnormalities but remote ACA infarct. Intubated 4/13-4/14. PMH of seizures, GERD, CAD, DM, HTN, CVA.    Assessment / Plan / Recommendation Clinical Impression  Swallow evaluation complete. Patient lethargic, easily aroused however unable to maintain adequate level of cueing for significant po trials today. Ice chip trials resulted in oral holding, poor awareness of bolus despite cueing from clinician. Prognosis for ability to resume a po diet good with improved mentation. Patient confused but mentioned receiving speech pathology services in the past, seemingly for cognitive-linguistic skills however further questioning is warranted.     Aspiration Risk  Severe    Diet Recommendation NPO   Medication Administration: Via alternative means    Other  Recommendations Oral Care Recommendations:  (QID)      Frequency and Duration min 2x/week  2 weeks   Pertinent Vitals/Pain n/a        Swallow Study    General HPI: 59 year old male [facility] with AMS, left sided weakness, stupourous. admitted to neurology with possible stroke. In ED was intubated due to periods of apnea. Head CT  showed no acute abnormalities but remote ACA infarct. Intubated 4/13-4/14. PMH of seizures, GERD, CAD, DM, HTN, CVA.  Type of Study: Bedside swallow evaluation Previous Swallow Assessment: none Diet Prior to this Study: NPO Temperature Spikes Noted: No Respiratory Status: Nasal cannula History of Recent Intubation: Yes Length of Intubations (days): 1 days Date extubated: 08/09/14 Behavior/Cognition: Pleasant mood;Confused;Lethargic;Distractible Oral Cavity - Dentition: Dentures, top;Adequate natural dentition Self-Feeding Abilities: Needs assist Patient Positioning: Upright in bed Baseline Vocal Quality: Hoarse;Low vocal intensity Volitional Cough: Cognitively unable to elicit Volitional Swallow: Able to elicit    Oral/Motor/Sensory Function Overall Oral Motor/Sensory Function: Appears within functional limits for tasks assessed (although difficulty to assess secondary to lethargy)   Ice Chips Ice chips: Impaired Presentation: Spoon Oral Phase Impairments: Impaired anterior to posterior transit;Poor awareness of bolus Oral Phase Functional Implications: Oral holding   Thin Liquid Thin Liquid: Not tested    Nectar Thick Nectar Thick Liquid: Not tested   Honey Thick Honey Thick Liquid: Not tested   Puree Puree: Not tested   Solid   GO   Terrance Lamm Terrance Cook, Terrance Cook [phone]  Solid: Not tested       Terrance Cook 08/09/2014,3:12 PM

## 2014-08-09 NOTE — Progress Notes (Signed)
eLink Physician-Brief Progress Note Patient Name: Jacinto HalimJames Kukuk DOB: 27-Feb-1956 MRN: [id number]   Date of Service  08/09/2014  HPI/Events of Note  Fever to 102.3 F. Already cultured and on Tylenol.  eICU Interventions  Will order: 1. Cooling blanket. 2. Vancomycin IV -  dosing per pharmacy. 3. Zosyn IV.     Intervention Category Major Interventions: Acute renal failure - evaluation and management;Infection - evaluation and management  Damarri Rampy [name] 08/09/2014, 7:10 PM

## 2014-08-10 DIAGNOSIS — I6789 Other cerebrovascular disease: Secondary | ICD-10-CM

## 2014-08-10 DIAGNOSIS — R410 Disorientation, unspecified: Secondary | ICD-10-CM

## 2014-08-10 LAB — GLUCOSE, CAPILLARY
GLUCOSE-CAPILLARY: 93 mg/dL (ref 70–99)
GLUCOSE-CAPILLARY: 128 mg/dL — AB (ref 70–99)
Glucose-Capillary: 117 mg/dL — ABNORMAL HIGH (ref 70–99)
Glucose-Capillary: 121 mg/dL — ABNORMAL HIGH (ref 70–99)
Glucose-Capillary: 117 mg/dL — ABNORMAL HIGH (ref 70–99)

## 2014-08-10 LAB — BLOOD GAS, ARTERIAL
Acid-Base Excess: 3.5 mmol/L — ABNORMAL HIGH (ref 0.0–2.0)
BICARBONATE: 28.2 meq/L — AB (ref 20.0–24.0)
Drawn by: 406621
O2 Content: 4 L/min
O2 Saturation: 92.1 %
PO2 ART: 77.4 mmHg — AB (ref 80.0–100.0)
Patient temperature: 102.6
TCO2: 29.6 mmol/L (ref 0–100)
pCO2 arterial: 53.9 mmHg — ABNORMAL HIGH (ref 35.0–45.0)
pH, Arterial: 7.351 (ref 7.350–7.450)

## 2014-08-10 LAB — BASIC METABOLIC PANEL
Anion gap: 9 (ref 5–15)
BUN: 13 mg/dL (ref 6–23)
CALCIUM: 8.7 mg/dL (ref 8.4–10.5)
CO2: 29 mmol/L (ref 19–32)
CREATININE: 1.15 mg/dL (ref 0.50–1.35)
Chloride: 101 mmol/L (ref 96–112)
GFR, EST AFRICAN AMERICAN: 80 mL/min — AB (ref >90)
GFR, EST NON AFRICAN AMERICAN: 69 mL/min — AB (ref >90)
GLUCOSE: 100 mg/dL — AB (ref 70–99)
Potassium: 4.6 mmol/L (ref 3.5–5.1)
Sodium: 139 mmol/L (ref 135–145)

## 2014-08-10 LAB — HEMOGLOBIN A1C
Hgb A1c MFr Bld: 6.4 % — ABNORMAL HIGH (ref 4.8–5.6)
Mean Plasma Glucose: 137 mg/dL

## 2014-08-10 MED ORDER — PERFLUTREN LIPID MICROSPHERE
1.0000 mL | INTRAVENOUS | Status: AC | PRN
  Administered 2014-08-10: 2 mL via INTRAVENOUS
  Filled 2014-08-10: qty 10

## 2014-08-10 MED ORDER — QUETIAPINE FUMARATE 25 MG PO TABS
25.0000 mg | ORAL_TABLET | Freq: Every day | ORAL | Status: DC
  Administered 2014-08-10 – 2014-08-12 (×3): 25 mg via ORAL
  Filled 2014-08-10 (×4): qty 1

## 2014-08-10 MED ORDER — CIPROFLOXACIN HCL 500 MG PO TABS
500.0000 mg | ORAL_TABLET | Freq: Two times a day (BID) | ORAL | Status: DC
  Administered 2014-08-10 – 2014-08-12 (×5): 500 mg via ORAL
  Filled 2014-08-10 (×8): qty 1

## 2014-08-10 MED ORDER — BUPRENORPHINE HCL 2 MG SL SUBL
4.0000 mg | SUBLINGUAL_TABLET | Freq: Two times a day (BID) | SUBLINGUAL | Status: DC
  Administered 2014-08-10 – 2014-08-13 (×7): 4 mg via SUBLINGUAL
  Filled 2014-08-10 (×7): qty 2

## 2014-08-10 MED ORDER — DULOXETINE HCL 60 MG PO CPEP
60.0000 mg | ORAL_CAPSULE | Freq: Every day | ORAL | Status: DC
  Administered 2014-08-10 – 2014-08-13 (×4): 60 mg via ORAL
  Filled 2014-08-10 (×4): qty 1

## 2014-08-10 MED ORDER — GABAPENTIN 400 MG PO CAPS
800.0000 mg | ORAL_CAPSULE | Freq: Two times a day (BID) | ORAL | Status: DC
  Administered 2014-08-10 – 2014-08-13 (×7): 800 mg via ORAL
  Filled 2014-08-10 (×8): qty 2

## 2014-08-10 MED ORDER — IPRATROPIUM-ALBUTEROL 0.5-2.5 (3) MG/3ML IN SOLN
3.0000 mL | Freq: Four times a day (QID) | RESPIRATORY_TRACT | Status: DC
  Administered 2014-08-10 – 2014-08-12 (×8): 3 mL via RESPIRATORY_TRACT
  Filled 2014-08-10 (×8): qty 3

## 2014-08-10 MED ORDER — SODIUM CHLORIDE 0.9 % IV SOLN
1000.0000 mg | Freq: Two times a day (BID) | INTRAVENOUS | Status: DC
  Administered 2014-08-10 – 2014-08-11 (×4): 1000 mg via INTRAVENOUS
  Filled 2014-08-10 (×6): qty 10

## 2014-08-10 NOTE — Progress Notes (Signed)
PULMONARY / CRITICAL CARE MEDICINE   Name: Terrance Cook MRN: [id number] DOB: 1955-12-15    ADMISSION DATE:  08/08/2014 CONSULTATION DATE:  08/08/2014  REFERRING MD :  Roseanne RenoStewart  CHIEF COMPLAINT:  AMS  INITIAL PRESENTATION: 59 year old [facility] with A[facility], admitted to neurology with possible stroke. In ED was intubated due to periods of apnea. To ICU, PCCM to assist with medical management.   STUDIES:  CT head 4/13 > No acute intracranial abnormalities identified. Small vessel ischemic change and brain atrophy. 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 CTA head 4/13 >. No acute abnormality within the major arterial vasculature of the Neck. Multifocal atheromatous plaque about the carotid bifurcations/proximal internal carotid arteries bilaterally. There is associated short-segment stenosis of approximately 60% at the right carotid bifurcation, with short-segment stenosis of approximately 50-60% at the left carotid bifurcation.  SIGNIFICANT EVENTS:   HISTORY OF PRESENT ILLNESS:  59 year old male with PMH CVA, CAD, DM, and HTN [facility] with A[facility]. He was stuporous and having some L sided weakness. Last seen normal 1500. Neurology evaluated him in ED and ordered CT head, which showed no acute abnormality but remote ACA infarct. He had labored respirations and several periods of apnea so was intubated for airway protection in ED. He is on multiple medications for pain, seizures, and HTN and is from SNF. He was admitted to ICU for ventilator support and monitoring. PCCM to assist with medical management.   SUBJECTIVE:  Agitated overnight, required restraints and precedex Febrile to 102, staretd on empiric zosyn and vanco  VITAL SIGNS: Temp:  [97.7 F (36.5 C)-102.6 F (39.2 C)] 99.5 F (37.5 C) (04/15 0700) Pulse Rate:  [56-93] 65 (04/15 0700) Resp:  [10-25] 14 (04/15 0700) BP: (77-152)/(44-103) 96/44 mmHg (04/15 0700) SpO2:  [91 %-99 %] 94 % (04/15 0759) FiO2 (%):   [40 %] 40 % (04/14 1000) HEMODYNAMICS:   VENTILATOR SETTINGS: Vent Mode:  [-]  FiO2 (%):  [40 %] 40 % INTAKE / OUTPUT:  Intake/Output Summary (Last 24 hours) at 08/10/14 0915 Last data filed at 08/10/14 0700  Gross per 24 hour  Intake 1555.06 ml  Output   2575 ml  Net -1019.94 ml    PHYSICAL EXAMINATION: General:  Obese male in NAD, restraints in place Neuro:  RASS +1, unable to move LLE, following commands, oriented x2, mild anxiety  HEENT:  [place]/AT, pupils react Cardiovascular:  RRR, no MRG Lungs:  Clear bilateral breath sounds Abdomen:  Soft, non-distended Musculoskeletal:  No acute deformity Skin:  Grossly intact  LABS:  CBC  Recent Labs Lab 08/08/14 1905 08/08/14 1912 08/09/14 0305  WBC 7.2  --  7.3  HGB 13.0 15.0 13.3  HCT 39.6 44.0 39.6  PLT 247  --  107*   Coag's  Recent Labs Lab 08/08/14 1905  APTT 30  INR 1.19   BMET  Recent Labs Lab 08/08/14 1905 08/08/14 1912 08/09/14 0305 08/10/14 0312  NA 134* 135 138 139  K 4.9 4.8 4.4 4.6  CL 97 98 101 101  CO2 24  --  21 29  BUN 30* 38* 30* 13  CREATININE 1.70* 1.70* 1.32 1.15  GLUCOSE 114* 112* 94 100*   Electrolytes  Recent Labs Lab 08/08/14 1905 08/09/14 0305 08/10/14 0312  CALCIUM 9.6 9.6 8.7   Sepsis Markers No results for input(s): LATICACIDVEN, PROCALCITON, O2SATVEN in the last 168 hours. ABG  Recent Labs Lab 08/08/14 1951 08/08/14 2119 08/09/14 1825  PHART  7.200* 7.324* 7.351  PCO2ART 69.6* 53.4* 53.9*  PO2ART 105.0* 351.0* 77.4*   Liver Enzymes  Recent Labs Lab 08/08/14 1905  AST 84*  ALT 62*  ALKPHOS 22*  BILITOT 0.8  ALBUMIN 3.9   Cardiac Enzymes No results for input(s): TROPONINI, PROBNP in the last 168 hours. Glucose  Recent Labs Lab 08/09/14 1157 08/09/14 1546 08/09/14 1922 08/09/14 2337 08/10/14 0331 08/10/14 0724  GLUCAP 128* 124* 120* 128* 93 117*    Imaging Mr Brain Wo Contrast  08/09/2014   CLINICAL DATA:  Stroke.  Altered mental status.   EXAM: MRI HEAD WITHOUT CONTRAST  TECHNIQUE: Multiplanar, multiecho pulse sequences of the brain and surrounding structures were obtained without intravenous contrast.  COMPARISON:  CT head 08/08/2014  FINDINGS: Image quality degraded by motion  Negative for acute infarct.  Chronic right ACA infarct as noted on the prior CT. Mild chronic microvascular ischemic change in the white matter. Brainstem and cerebellum intact.  Ventricle size is normal.  Cerebral volume is normal for age.  Negative for hemorrhage or mass. No shift of the midline structures.  IMPRESSION: Negative for acute infarct  Chronic right ACA infarct.  Chronic microvascular ischemia.   Electronically Signed   By: Marlan Palau M.D.   On: 08/09/2014 17:41   Dg Chest Port 1 View  08/09/2014   CLINICAL DATA:  Hypoxia  EXAM: PORTABLE CHEST - 1 VIEW  COMPARISON:  August 08, 2014  FINDINGS: Endotracheal tube tip is 2.4 cm above the carina. Nasogastric tube tip and side port are below the diaphragm. No pneumothorax. There is atelectatic change in the left base. Lungs elsewhere clear. Heart is borderline enlarged with pulmonary vascularity within normal limits. No adenopathy.  IMPRESSION: Tube positions as described without pneumothorax. Atelectasis left base. Lungs otherwise clear. No change in cardiac silhouette.   Electronically Signed   By: Bretta Bang III M.D.   On: 08/09/2014 07:59   Dg Abd Portable 1v  08/09/2014   CLINICAL DATA:  Encounter for OG tube placement.  EXAM: PORTABLE ABDOMEN - 1 VIEW  COMPARISON:  None.  FINDINGS: Tip and side port of the enteric tube below the diaphragm in the stomach. Mild gaseous gastric distention. No dilated bowel loops to suggest obstruction.  IMPRESSION: Tip and side port of the enteric tube below the diaphragm in the stomach.   Electronically Signed   By: Rubye Oaks M.D.   On: 08/09/2014 01:17     ASSESSMENT / PLAN:  PULMONARY OETT 4/13 > A: Acute hypoxemic respiratory failure in setting  AMS, suspect med related  P:   Tolerated extubation Push pulm hygiene Carefully add back his home meds - careful for sedation and resp suppression Bronchodilators  CARDIOVASCULAR A:  Hypotension, resolved with decrease in propofol H/o HTN P:  TTE results pending Continue statin, asa Holding ACEi, amlodipine, atenolol, restart as BP dictates PRN metoprolol for tachycardia PRN hydralazine for HTN  RENAL A:   Renal insufficiency, resolved  P:   Hold ACE-i, lasix; consider restart 4/16 IVF hydration Follow BMP  GASTROINTESTINAL A:   GERD  P:   Continue home PPI  Start diet 4/15  HEMATOLOGIC A:   No acute issues  P:  Heparin for VTE ppx Follow CBC  INFECTIOUS Blood 4/14 >>  resp 4/14 >>  UA 4/14 >> bacteria, small LE  vanco 4/14 >> 4/15 Zosyn 4/14 >> 4/15 cipro 4/15 >>   A:   Fever 4/14, normal WBC; possible UTI  P:   Started  on empiric vanco + zosyn 4/14 pm Unclear whether there is a clear focus infxn, cx's sent 4/15. UA suggests UTI Will change vanco / zosyn to cipro 4/15  ENDOCRINE A:   DM    P:   CBG monitoring and SSI Holding metformin  NEUROLOGIC A:   Acute encephalopathy, suspect polypharmacy induced. Seizure disorder without evidence of seizure currently Chronic pain Reactivation old R CVA; MRI 4/14 negative for new stroke Agitated delirium - improved 4/15 am P:   RASS goal: 0 Required precedex o/n 4/14 Continue home valium, gabapentin (back to home dose), Lamictal, keppra Add back PO narcotics (substitued subutex SL for suboxone, lower dose and freqency than home schedule) Restart qhs seroquel 4/15 Attempt to mobilize, OOB, reorientation  FAMILY  - Updates: none present 4/15  - Inter-disciplinary family meet or Palliative Care meeting due by:  4/20   35 minutes CC time  Hopefully transition to SDU status this pm if delirium clearing   Levy Pupa, MD, PhD 08/10/2014, 9:15 AM [facility] [phone] or if no answer [phone]

## 2014-08-10 NOTE — Progress Notes (Addendum)
  Echocardiogram 2D Echocardiogram with Definity has been performed.  Terrance Cook, Terrance Cook 08/10/2014, 9:29 AM

## 2014-08-10 NOTE — Evaluation (Signed)
Physical Therapy Evaluation Patient Details Name: Terrance Cook MRN: [id number] DOB: 03/10/1956 Today's Date: 08/10/2014   History of Present Illness  Pt is a 59 year old male who presented from SNF/ALF to [facility] ED 4/13 with AMS, left sided weakness, stupourous. admitted to neurology with possible stroke. In ED was intubated due to periods of apnea. Head CT showed no acute abnormalities but remote ACA infarct. Intubated 4/13-4/14. PMH of seizures, GERD, CAD, DM, HTN, CVA with L side residual weakness.  Clinical Impression  Pt admitted with above diagnosis. Pt currently with functional limitations due to the deficits listed below (see PT Problem List). At the time of PT eval pt was able to perform transfers with mod assist. Was not able to attempt any LE movement in standing due to weakness. Pt states at baseline he is able to independently transfer himself bed<>wheelchair, and is currently asking for a wheelchair to go outside and smoke.  Pt will benefit from skilled PT to increase their independence and safety with mobility to allow discharge to the venue listed below.       Follow Up Recommendations Home health PT (At ALF)    Equipment Recommendations  Rolling walker with 5" wheels (Unsure if pt has a walker available to him)    Recommendations for Other Services       Precautions / Restrictions Precautions Precautions: Fall Required Braces or Orthoses: Other Brace/Splint Other Brace/Splint: Pt has a L knee brace that he wears for support all the time Restrictions Weight Bearing Restrictions: No      Mobility  Bed Mobility Overal bed mobility: Needs Assistance Bed Mobility: Supine to Sit;Sit to Supine     Supine to sit: Min guard Sit to supine: Min guard   General bed mobility comments: Pt was able to transition to EOB with use of bed rails for support. Pt used RLE to assist LLE with movement. Close guard for safety with trunk control as pt achieved full sitting position.    Transfers Overall transfer level: Needs assistance Equipment used:  (Recliner turned backwards in front of pt for support) Transfers: Sit to/from Stand Sit to Stand: Mod assist         General transfer comment: Light mod assist to power-up to full standing. Pt with good UE strength and pulled to stand, however would like to see him push up from the bed next session. Pt held static standing ~2 minutes with UE support on back of recliner. Unable to perform pre-gait activity.   Ambulation/Gait             General Gait Details: Pt is at a transfers-only level at baseline per RN report and chart review.   Stairs            Wheelchair Mobility    Modified Rankin (Stroke Patients Only)       Balance Overall balance assessment: Needs assistance Sitting-balance support: Feet supported;No upper extremity supported Sitting balance-Leahy Scale: Fair     Standing balance support: Bilateral upper extremity supported Standing balance-Leahy Scale: Poor Standing balance comment: Requires UE support to maintain standing balance.                              Pertinent Vitals/Pain Pain Assessment: No/denies pain    Home Living Family/patient expects to be discharged to:: Assisted living               Home Equipment: Wheelchair - manual  Prior Function Level of Independence: Independent with assistive device(s)         Comments: Pt states he was living at an ALF PTA, and able to transfer himself independently to/from his wheelchair, and self-propel. He states he could also perform self-care without assist. Of note, pt is somewhat confused per RN and unsure how accurate this is.      Hand Dominance   Dominant Hand: Right    Extremity/Trunk Assessment   Upper Extremity Assessment: LUE deficits/detail       LUE Deficits / Details: Residual weakness from prior CVA. Decreased shoulder AROM,and gross strength noted. Strength decreased  bilaterally, however L weaker than R.    Lower Extremity Assessment: LLE deficits/detail   LLE Deficits / Details: Decreased strength and AROM consistent with prior CVA. Minimal ankle motion, and cannot flex hip against gravity. Pt with knee brace donned which limited any flexion.  Cervical / Trunk Assessment: Normal  Communication   Communication: No difficulties  Cognition Arousal/Alertness: Awake/alert Behavior During Therapy: WFL for tasks assessed/performed Overall Cognitive Status: No family/caregiver present to determine baseline cognitive functioning       Memory: Decreased short-term memory              General Comments      Exercises        Assessment/Plan    PT Assessment Patient needs continued PT services  PT Diagnosis Generalized weakness;Difficulty walking   PT Problem List Decreased strength;Decreased range of motion;Decreased activity tolerance;Decreased balance;Decreased mobility;Decreased knowledge of use of DME;Decreased safety awareness;Decreased knowledge of precautions  PT Treatment Interventions DME instruction;Gait training;Stair training;Functional mobility training;Therapeutic activities;Therapeutic exercise;Neuromuscular re-education;Patient/family education   PT Goals (Current goals can be found in the Care Plan section) Acute Rehab PT Goals Patient Stated Goal: Back to ALF at d/c and eventually get his own home PT Goal Formulation: With patient Time For Goal Achievement: 08/24/14 Potential to Achieve Goals: Good    Frequency Min 4X/week   Barriers to discharge        Co-evaluation               End of Session Equipment Utilized During Treatment: Gait belt;Oxygen Activity Tolerance: Patient tolerated treatment well Patient left: in bed;with call bell/phone within reach Nurse Communication: Mobility status         Time: [id number] PT Time Calculation (min) (ACUTE ONLY): 26 min   Charges:   PT Evaluation $Initial PT  Evaluation Tier I: 1 Procedure PT Treatments $Therapeutic Activity: 8-22 mins   PT G Codes:        Conni SlipperKirkman, Audryana Hockenberry 08/10/2014, 3:19 PM   Conni SlipperLaura Silvia Hightower, PT, DPT Acute Rehabilitation Services Pager: [phone]

## 2014-08-10 NOTE — Progress Notes (Signed)
Speech Language Pathology Treatment: Dysphagia  Patient Details Name: Terrance Cook MRN: [id number] DOB: 02/25/1956 Today's Date: 08/10/2014 Time: [id number] SLP Time Calculation (min) (ACUTE ONLY): 19 min  Assessment / Plan / Recommendation Clinical Impression  Pt alert today, mild-moderate confusion, following commands. Suspect delayed swallow initiation. No s/s aspiration. Pt will require full supervision due to impulsivity. Recommend Dys 3 (due to cognitive deficits) and thin liquids, pills with thin water, pt prefers cup over straw use. ST will continue to ensure safety and efficiency and upgrade to regular texture.   HPI HPI: 59 year old male [facility] with AMS, left sided weakness, stupourous. admitted to neurology with possible stroke. In ED was intubated due to periods of apnea. Head CT showed no acute abnormalities but remote ACA infarct. Intubated 4/13-4/14. PMH of seizures, GERD, CAD, DM, HTN, CVA.    Pertinent Vitals Pain Assessment: No/denies pain  SLP Plan  Continue with current plan of care    Recommendations Diet recommendations: Dysphagia 3 (mechanical soft);Thin liquid Liquids provided via: Cup;No straw (pt prefers no straws) Medication Administration: Whole meds with liquid Supervision: Patient able to self feed;Full supervision/cueing for compensatory strategies Compensations: Slow rate;Small sips/bites;Check for pocketing Postural Changes and/or Swallow Maneuvers: Seated upright 90 degrees              Oral Care Recommendations: Oral care BID Follow up Recommendations:  (TBD) Plan: Continue with current plan of care    GO     Terrance Cook, Terrance Cook 08/10/2014, 10:41 AM  Terrance CoonsLisa Cook Lonell FaceLitaker M.Ed [employer]-SLP Pager [phone]

## 2014-08-10 NOTE — Clinical Social Work Note (Signed)
Clinical Social Worker received notification from RN that [facility] with plans to return at discharge.  PT has completed assessment and feels that patient can return with home health.  Per RN, patient is alert and oriented to self only at this time.  CSW attempted to reach patient brother over the phone with no success.  CSW contacted [facility] to inquire about additional emergency contacts, however they state they do no have access to them at this time.  [facility] states that patient will need to be evaluated prior to return and the earliest time is Monday - CS[facility] to [facility].  CSW to complete full assesment with emergency contact information once available.  CSW remains available for support and to assist with discharge planning needs.  Terrance Cook, [place] [phone]

## 2014-08-11 LAB — URINE CULTURE
Colony Count: NO GROWTH
Culture: NO GROWTH

## 2014-08-11 LAB — BASIC METABOLIC PANEL
ANION GAP: 7 (ref 5–15)
BUN: 9 mg/dL (ref 6–23)
CHLORIDE: 103 mmol/L (ref 96–112)
CO2: 29 mmol/L (ref 19–32)
Calcium: 8.6 mg/dL (ref 8.4–10.5)
Creatinine, Ser: 0.82 mg/dL (ref 0.50–1.35)
GFR calc Af Amer: >90 mL/min (ref >90)
Glucose, Bld: 131 mg/dL — ABNORMAL HIGH (ref 70–99)
Potassium: 3.9 mmol/L (ref 3.5–5.1)
Sodium: 139 mmol/L (ref 135–145)

## 2014-08-11 LAB — CBC
HCT: 34.4 % — ABNORMAL LOW (ref 39.0–52.0)
Hemoglobin: 11.2 g/dL — ABNORMAL LOW (ref 13.0–17.0)
MCH: 28.1 pg (ref 26.0–34.0)
MCHC: 32.6 g/dL (ref 30.0–36.0)
MCV: 86.2 fL (ref 78.0–100.0)
PLATELETS: 141 10*3/uL — AB (ref 150–400)
RBC: 3.99 MIL/uL — ABNORMAL LOW (ref 4.22–5.81)
RDW: 13 % (ref 11.5–15.5)
WBC: 4.8 10*3/uL (ref 4.0–10.5)

## 2014-08-11 LAB — GLUCOSE, CAPILLARY
GLUCOSE-CAPILLARY: 119 mg/dL — AB (ref 70–99)
Glucose-Capillary: 105 mg/dL — ABNORMAL HIGH (ref 70–99)
Glucose-Capillary: 117 mg/dL — ABNORMAL HIGH (ref 70–99)
Glucose-Capillary: 143 mg/dL — ABNORMAL HIGH (ref 70–99)

## 2014-08-11 NOTE — Progress Notes (Signed)
Pt becoming more and more agitated t/o shift. Continued to re-orient person to place, time and situation. Safety sitter at bedside. Pt continues to attempt to get OOB and pulling off lines and IVs. Pt beginning to push staff away from pt. Medicine given per orders to decrease agitation. Pt continues to attempt to get OOB and heart monitor. Wrist restraints applied. Renae FicklePaul, RN from [facility] called and notified of events and need for wrist restraints. Will continue to monitor.

## 2014-08-11 NOTE — Progress Notes (Signed)
PULMONARY / CRITICAL CARE MEDICINE   Name: Terrance Cook MRN: [id number] DOB: [birth year]    ADMISSION DATE:  08/08/2014 CONSULTATION DATE:  08/08/2014  REFERRING MD :  Roseanne Reno  CHIEF COMPLAINT:  AMS  INITIAL PRESENTATION: 59 year old male presented to [facility] ED 4/13 with AMS, admitted to neurology with possible stroke. In ED was intubated due to periods of apnea. To ICU, PCCM to assist with medical management.   STUDIES:  CT head 4/13 > No acute intracranial abnormalities identified. Small vessel ischemic change and brain atrophy. 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 CTA head 4/13 >. No acute abnormality within the major arterial vasculature of the Neck. Multifocal atheromatous plaque about the carotid bifurcations/proximal internal carotid arteries bilaterally. There is associated short-segment stenosis of approximately 60% at the right carotid bifurcation, with short-segment stenosis of approximately 50-60% at the left carotid bifurcation.  SIGNIFICANT EVENTS:   HISTORY OF PRESENT ILLNESS:  59 year old male with PMH CVA, CAD, DM, and HTN presented to [facility] ED 4/13 with AMS. He was stuporous and having some L sided weakness. Last seen normal 1500. Neurology evaluated him in ED and ordered CT head, which showed no acute abnormality but remote ACA infarct. He had labored respirations and several periods of apnea so was intubated for airway protection in ED. He is on multiple medications for pain, seizures, and HTN and is from SNF. He was admitted to ICU for ventilator support and monitoring. PCCM to assist with medical management.   SUBJECTIVE:  Agitated overnight, required restraints and precedex Febrile to 102, staretd on empiric zosyn and vanco  VITAL SIGNS: Temp:  [97.4 F (36.3 C)-99.3 F (37.4 C)] 97.4 F (36.3 C) (04/16 0800) Pulse Rate:  [63-107] 70 (04/16 1000) Resp:  [8-21] 15 (04/16 1000) BP: (93-158)/(41-123) 145/75 mmHg (04/16 1000) SpO2:  [86 %-98 %] 93 % (04/16  1000) HEMODYNAMICS:   VENTILATOR SETTINGS:   INTAKE / OUTPUT:  Intake/Output Summary (Last 24 hours) at 08/11/14 1133 Last data filed at 08/11/14 1100  Gross per 24 hour  Intake   1612 ml  Output   1300 ml  Net    312 ml    PHYSICAL EXAMINATION: General:  Obese male in NAD, restraints in place Neuro:  RASS +1, unable to move LLE, following commands, oriented x2, mild anxiety  HEENT:  [place]/AT, pupils react Cardiovascular:  RRR, no MRG Lungs:  Clear bilateral breath sounds Abdomen:  Soft, non-distended Musculoskeletal:  No acute deformity Skin:  Grossly intact  LABS:  CBC  Recent Labs Lab 08/08/14 1905 08/08/14 1912 08/09/14 0305 08/11/14 0230  WBC 7.2  --  7.3 4.8  HGB 13.0 15.0 13.3 11.2*  HCT 39.6 44.0 39.6 34.4*  PLT 247  --  107* 141*   Coag's  Recent Labs Lab 08/08/14 1905  APTT 30  INR 1.19   BMET  Recent Labs Lab 08/09/14 0305 08/10/14 0312 08/11/14 0230  NA 138 139 139  K 4.4 4.6 3.9  CL 101 101 103  CO2 BUN 30* 13 9  CREATININE 1.32 1.15 0.82  GLUCOSE 94 100* 131*   Electrolytes  Recent Labs Lab 08/09/14 0305 08/10/14 0312 08/11/14 0230  CALCIUM 9.6 8.7 8.6   Sepsis Markers No results for input(s): LATICACIDVEN, PROCALCITON, O2SATVEN in the last 168 hours. ABG  Recent Labs Lab 08/08/14 1951 08/08/14 2119 08/09/14 1825  PHART 7.200* 7.324* 7.351  PCO2ART 69.6* 53.4* 53.9*  PO2ART 105.0* 351.0* 77.4*   Liver Enzymes  Recent  Labs Lab 08/08/14 1905  AST 84*  ALT 62*  ALKPHOS 22*  BILITOT 0.8  ALBUMIN 3.9   Cardiac Enzymes No results for input(s): TROPONINI, PROBNP in the last 168 hours. Glucose  Recent Labs Lab 08/10/14 1110 08/10/14 1617 08/10/14 1913 08/10/14 2311 08/11/14 0320 08/11/14 0718  GLUCAP 121* 128* 117* 105* 119* 117*    Imaging No results found.   ASSESSMENT / PLAN:  PULMONARY OETT 4/13 > A: Acute hypoxemic respiratory failure in setting AMS, suspect med related  P:    Tolerated extubation Push pulm hygiene Carefully add back his home meds - careful for sedation and resp suppression Bronchodilators  CARDIOVASCULAR A:  Hypotension, resolved with decrease in propofol H/o HTN P:  TTE EF 55-60 with grade one diastolic dysfunction. Continue statin, asa Holding ACEi, amlodipine, atenolol, restart as BP dictates PRN metoprolol for tachycardia PRN hydralazine for HTN  RENAL A:   Renal insufficiency, resolved  P:   Hold ACE-i, lasix; consider restart 4/16 IVF hydration Follow BMP  GASTROINTESTINAL A:   GERD  P:   Continue home PPI  Started diet 4/15  HEMATOLOGIC A:   No acute issues  P:  Heparin for VTE ppx Follow CBC  INFECTIOUS Blood 4/14 >>  resp 4/14 >>  UA 4/14 >> bacteria, small LE  vanco 4/14 >> 4/15 Zosyn 4/14 >> 4/15 cipro 4/15 >>   A:   Fever 4/14, normal WBC; possible UTI  P:   Started on empiric vanco + zosyn 4/14 pm Unclear whether there is a clear focus infxn, cx's sent 4/15. UA suggests UTI Changed vanco / zosyn to cipro 4/15  ENDOCRINE A:   DM    P:   CBG monitoring and SSI Holding metformin  NEUROLOGIC A:   Acute encephalopathy, suspect polypharmacy induced. Seizure disorder without evidence of seizure currently Chronic pain Reactivation old R CVA; MRI 4/14 negative for new stroke Agitated delirium - improved 4/15 am P:   RASS goal: 0 D/C precedex Continue home valium, gabapentin (back to home dose), Lamictal, keppra Added back PO narcotics (substitued subutex SL for suboxone, lower dose and freqency than home schedule) Restart qhs seroquel 4/15 Attempt to mobilize, OOB, reorientation  Remains agitated intermittently, transfer [facility]U and [facility], PCCM off 4/17.  Alyson ReedyWesam G. Yacoub, M.D. [facility] Pulmonary/Critical Care Medicine. Pager: [phone]. After hours pager: [phone].  08/11/2014, 11:33 AM

## 2014-08-11 NOTE — Progress Notes (Signed)
eLink Physician-Brief Progress Note Patient Name: Terrance HalimJames Mocarski DOB: 11-27-1955 MRN: [id number]   Date of Service  08/11/2014  HPI/Events of Note  Severe agitation  eICU Interventions  Bilateral soft wrist restraints for patient safety     Intervention Category Major Interventions: Delirium, psychosis, severe agitation - evaluation and management  Corinna Burkman 08/11/2014, 1:00 AM

## 2014-08-12 LAB — BASIC METABOLIC PANEL
Anion gap: 9 (ref 5–15)
CALCIUM: 8.4 mg/dL (ref 8.4–10.5)
CHLORIDE: 101 mmol/L (ref 96–112)
CO2: 28 mmol/L (ref 19–32)
Creatinine, Ser: 0.71 mg/dL (ref 0.50–1.35)
GFR calc Af Amer: >90 mL/min (ref >90)
GFR calc non Af Amer: >90 mL/min (ref >90)
GLUCOSE: 116 mg/dL — AB (ref 70–99)
Potassium: 3.8 mmol/L (ref 3.5–5.1)
Sodium: 138 mmol/L (ref 135–145)

## 2014-08-12 LAB — CULTURE, RESPIRATORY: Special Requests: NORMAL

## 2014-08-12 LAB — CBC
HEMATOCRIT: 36.4 % — AB (ref 39.0–52.0)
HEMOGLOBIN: 11.9 g/dL — AB (ref 13.0–17.0)
MCH: 28.2 pg (ref 26.0–34.0)
MCHC: 32.7 g/dL (ref 30.0–36.0)
MCV: 86.3 fL (ref 78.0–100.0)
Platelets: 147 10*3/uL — ABNORMAL LOW (ref 150–400)
RBC: 4.22 MIL/uL (ref 4.22–5.81)
RDW: 12.9 % (ref 11.5–15.5)
WBC: 3.6 10*3/uL — ABNORMAL LOW (ref 4.0–10.5)

## 2014-08-12 LAB — CULTURE, RESPIRATORY W GRAM STAIN

## 2014-08-12 LAB — PHOSPHORUS: Phosphorus: 3.6 mg/dL (ref 2.3–4.6)

## 2014-08-12 LAB — MAGNESIUM: MAGNESIUM: 1.3 mg/dL — AB (ref 1.5–2.5)

## 2014-08-12 MED ORDER — AMLODIPINE BESYLATE 10 MG PO TABS
10.0000 mg | ORAL_TABLET | Freq: Every day | ORAL | Status: DC
  Administered 2014-08-12 – 2014-08-13 (×2): 10 mg via ORAL
  Filled 2014-08-12 (×2): qty 1

## 2014-08-12 MED ORDER — ASPIRIN 325 MG PO TABS: 325.0000 mg | ORAL_TABLET | Freq: Every day | ORAL | Status: DC

## 2014-08-12 MED ORDER — FEBUXOSTAT 40 MG PO TABS
40.0000 mg | ORAL_TABLET | Freq: Every day | ORAL | Status: DC
  Administered 2014-08-12 – 2014-08-13 (×2): 40 mg via ORAL
  Filled 2014-08-12 (×2): qty 1

## 2014-08-12 MED ORDER — ATORVASTATIN CALCIUM 20 MG PO TABS: 20.0000 mg | ORAL_TABLET | Freq: Every day | ORAL | Status: DC

## 2014-08-12 MED ORDER — LAMOTRIGINE 25 MG PO TABS
25.0000 mg | ORAL_TABLET | Freq: Every day | ORAL | Status: DC
Start: 2014-08-13 — End: 2014-08-13
  Administered 2014-08-13: 25 mg via ORAL
  Filled 2014-08-12: qty 1

## 2014-08-12 MED ORDER — METFORMIN HCL 500 MG PO TABS
1000.0000 mg | ORAL_TABLET | Freq: Two times a day (BID) | ORAL | Status: DC
  Administered 2014-08-12 – 2014-08-13 (×2): 1000 mg via ORAL
  Filled 2014-08-12 (×4): qty 2

## 2014-08-12 MED ORDER — OXYBUTYNIN CHLORIDE ER 5 MG PO TB24
5.0000 mg | ORAL_TABLET | Freq: Every day | ORAL | Status: DC
  Administered 2014-08-12: 5 mg via ORAL
  Filled 2014-08-12 (×2): qty 1

## 2014-08-12 MED ORDER — ASPIRIN EC 81 MG PO TBEC
81.0000 mg | DELAYED_RELEASE_TABLET | Freq: Every day | ORAL | Status: DC
  Administered 2014-08-13: 81 mg via ORAL
  Filled 2014-08-12 (×2): qty 1

## 2014-08-12 MED ORDER — MAGNESIUM SULFATE 4 GM/100ML IV SOLN
4.0000 g | Freq: Once | INTRAVENOUS | Status: AC
  Administered 2014-08-12: 4 g via INTRAVENOUS
  Filled 2014-08-12: qty 100

## 2014-08-12 MED ORDER — LEVETIRACETAM 500 MG PO TABS
1000.0000 mg | ORAL_TABLET | Freq: Two times a day (BID) | ORAL | Status: DC
  Administered 2014-08-12 – 2014-08-13 (×3): 1000 mg via ORAL
  Filled 2014-08-12 (×4): qty 2

## 2014-08-12 MED ORDER — IPRATROPIUM-ALBUTEROL 0.5-2.5 (3) MG/3ML IN SOLN: 3.0000 mL | RESPIRATORY_TRACT | Status: DC | PRN

## 2014-08-12 MED ORDER — FAMOTIDINE 20 MG PO TABS
20.0000 mg | ORAL_TABLET | Freq: Every day | ORAL | Status: DC
  Administered 2014-08-12 – 2014-08-13 (×2): 20 mg via ORAL
  Filled 2014-08-12 (×2): qty 1

## 2014-08-12 MED ORDER — ATORVASTATIN CALCIUM 10 MG PO TABS
20.0000 mg | ORAL_TABLET | Freq: Every day | ORAL | Status: DC
  Administered 2014-08-12: 20 mg via ORAL
  Filled 2014-08-12: qty 1
  Filled 2014-08-12: qty 2

## 2014-08-12 MED ORDER — ACETAMINOPHEN 160 MG/5ML PO SOLN: 650.0000 mg | Freq: Four times a day (QID) | ORAL | Status: DC | PRN

## 2014-08-12 MED ORDER — FAMOTIDINE 20 MG PO TABS
20.0000 mg | ORAL_TABLET | Freq: Two times a day (BID) | ORAL | Status: DC
  Filled 2014-08-12 (×2): qty 1

## 2014-08-12 MED ORDER — PANTOPRAZOLE SODIUM 40 MG PO TBEC: 40.0000 mg | DELAYED_RELEASE_TABLET | Freq: Every day | ORAL | Status: DC

## 2014-08-12 MED ORDER — POLYETHYLENE GLYCOL 3350 17 G PO PACK
17.0000 g | PACK | Freq: Every day | ORAL | Status: DC | PRN
  Filled 2014-08-12: qty 1

## 2014-08-12 NOTE — Progress Notes (Addendum)
[place] TEAM 1 - Stepdown/ICU TEAM Progress Note  Jakel Alphin ZOX:[id number] DOB: [birth year] DOA: 08/08/2014 PCP: No PCP Per Patient  Admit HPI / Brief Narrative: 59 year old male with Hx CVA, CAD, DM, and HTN who presented to [facility] ED 4/13 with AMS. He was stuporous and having some L sided weakness. Neurology evaluated him in ED and ordered CT head, which showed no acute abnormality but remote ACA infarct. He had labored respirations and several periods of apnea and was intubated for airway protection in ED. He is on multiple medications for pain, seizures, and HTN and is from SNF. He was admitted to ICU for ventilator support and monitoring.    HPI/Subjective: Pt is currently calm and interactive.  He denies any complaints.  He is tolerating his diet w/o difficulty.  He denies cp, sob, n/v, or abdom pain.    Assessment/Plan:  Acute hypoxemic respiratory failure in setting AMS Felt to be medication related - extubated - no respiratory distress at this time - wean to RA as able   Acute encephalopathy due to polypharmacy Of note UDS was + for [facility] so can't be sure opiates and benzos noted on UDS were only those prescribed - pt appears stable on his current tx regimen, with good balance between pain control and sedation   Agitated delirium Appears to be improving each day - worse at night per RN - transfer out of ICU - cont QHS seroquel, and consider increasing dose if needed  Hx R CVA  MRI 4/14 negative for new CVA - cleared for D3 w/ thin liquids per SLP - 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   Hypotension Due to propofol - resolved w/ BP stable   Transaminitis  Unclear etiology - ?shock liver - recheck in AM   DM Check A1c - resume glucophage  Renal insufficiency  Resolved - crt peaked at 1.7  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 rate 14, height  (1.854 m), weight 113 kg (249 lb 1.9 oz), SpO2 93 %.  Intake/Output Summary (Last 24 hours) at 08/12/14 0931 Last data filed at 08/12/14 0900  Gross per 24 hour  Intake   1495 ml  Output   1475 ml  Net     20 ml   Exam: General: No acute respiratory distress Lungs: Clear to auscultation bilaterally without wheezes or crackles Cardiovascular: Regular rate and rhythm without murmur gallop or rub normal S1 and S2 Abdomen: Nontender, nondistended, soft, bowel sounds positive, no rebound, no ascites, no appreciable mass Extremities: No significant cyanosis, clubbing, or edema bilateral lower extremities  Data Reviewed: Basic Metabolic Panel:  Recent Labs Lab 08/08/14 1905 08/08/14 1912 08/09/14 0305 08/10/14 0312 08/11/14 0230 08/12/14 0220  NA 134* 135 138 139 139 138  K 4.9 4.8 4.4 4.6 3.9 3.8  CL 97 98 101 101 103 101  CO2  24  --  GLUCOSE 114* 112* 94 100* 131* 116*  BUN 30* 38* 30* 13 9 <5*  CREATININE 1.70* 1.70* 1.32 1.15 0.82 0.71  CALCIUM 9.6  --  9.6 8.7 8.6 8.4  MG  --   --   --   --   --  1.3*  PHOS  --   --   --   --   --  3.6    Liver Function Tests:  Recent Labs Lab 08/08/14 1905  AST 84*  ALT 62*  ALKPHOS 22*  BILITOT 0.8  PROT 7.0  ALBUMIN 3.9    Coags:  Recent Labs Lab 08/08/14 1905  INR 1.19    Recent Labs Lab 08/08/14 1905  APTT 30    CBC:  Recent Labs Lab 08/08/14 1905 08/08/14 1912 08/09/14 0305 08/11/14 0230 08/12/14 0220  WBC 7.2  --  7.3  4.8 3.6*  NEUTROABS 3.6  --   --   --   --   HGB 13.0 15.0 13.3 11.2* 11.9*  HCT 39.6 44.0 39.6 34.4* 36.4*  MCV 86.3  --  85.7 86.2 86.3  PLT 247  --  107* 141* 147*   CBG:  Recent Labs Lab 08/10/14 1913 08/10/14 2311 08/11/14 0320 08/11/14 0718 08/11/14 1122  GLUCAP 117* 105* 119* 117* 143*    Recent Results (from the past 240 hour(s))  Culture, respiratory (NON-Expectorated)     Status: None (Preliminary result)   Collection Time: 08/09/14  6:55 PM  Result Value Ref Range Status   Specimen Description TRACHEAL ASPIRATE  Final   Special Requests Normal  Final   Gram Stain   Final    MODERATE WBC PRESENT,BOTH PMN AND MONONUCLEAR MODERATE SQUAMOUS EPITHELIAL CELLS PRESENT MODERATE GRAM POSITIVE COCCI IN CLUSTERS IN CHAINS IN PAIRS FEW GRAM POSITIVE RODS RARE GRAM NEGATIVE RODS    Culture   Final    Culture reincubated for better growth Performed at [facility]    Report Status PENDING  Incomplete  Culture, blood (routine x 2)     Status: None (Preliminary result)   Collection Time: 08/09/14  7:00 PM  Result Value Ref Range Status   Specimen Description BLOOD LEFT HAND  Final   Special Requests BOTTLES DRAWN AEROBIC ONLY 1CC  Final   Culture   Final           BLOOD CULTURE RECEIVED NO GROWTH TO DATE CULTURE WILL BE HELD FOR 5 DAYS BEFORE ISSUING A FINAL NEGATIVE REPORT Note: Culture results may be compromised due to an inadequate volume of blood received in culture bottles. Performed at [facility]    Report Status PENDING  Incomplete  Culture, blood (routine x 2)     Status: None (Preliminary result)   Collection Time: 08/09/14  7:10 PM  Result Value Ref Range Status   Specimen Description BLOOD RIGHT HAND  Final   Special Requests BOTTLES DRAWN AEROBIC ONLY 1CC  Final   Culture   Final           BLOOD CULTURE RECEIVED NO GROWTH TO DATE CULTURE WILL BE HELD FOR 5 DAYS BEFORE ISSUING A FINAL NEGATIVE REPORT Note: Culture results may be  compromised due to an inadequate volume of blood received in culture bottles. Performed at [facility]    Report Status PENDING  Incomplete  Urine culture     Status: None   Collection Time: 08/10/14  9:48 AM  Result Value Ref Range  Status   Specimen Description URINE, CATHETERIZED  Final   Special Requests NONE  Final   Colony Count NO GROWTH [facility]   Final   Culture NO GROWTH [facility] at [facility]   Final   Report Status 08/11/2014 FINAL  Final     Studies:   Recent x-ray studies have been reviewed in detail by the Attending Physician  Scheduled Meds:  Scheduled Meds: . antiseptic oral rinse  7 mL Mouth Rinse QID  . aspirin  325 mg Per Tube Daily  . atorvastatin  20 mg Per Tube q1800  . buprenorphine  4 mg Sublingual BID  . chlorhexidine  15 mL Mouth Rinse BID  . ciprofloxacin  500 mg Oral BID  . diazepam  5 mg Oral 3 times per day  . DULoxetine  60 mg Oral Daily  . famotidine  20 mg Oral BID  . gabapentin  800 mg Oral BID  . heparin subcutaneous  5,000 Units Subcutaneous 3 times per day  . ipratropium-albuterol  3 mL Nebulization QID  . lamoTRIgine  25 mg Per Tube Daily  . levETIRAcetam  1,000 mg Oral BID  . pantoprazole  40 mg Oral QHS  . QUEtiapine  25 mg Oral QHS    Time spent on care of this patient: 35 mins   Wanell Lorenzi T , MD   [facility] Office  [phone] Pager - Text Page per Loretha StaplerAmion as per below:  On-Call/Text Page:      [email or web address]      password [email or web address]  If 7PM-7AM, please contact night-coverage [email or web address] Password [email or web address] 08/12/2014, 9:31 AM   LOS: 4 days

## 2014-08-12 NOTE — Progress Notes (Signed)
eLink Physician-Brief Progress Note Patient Name: Jacinto HalimJames Albert DOB: Sep 22, 1955 MRN: [id number]   Date of Service  08/12/2014  HPI/Events of Note  Hypomag  eICU Interventions  Mag replaced     Intervention Category Major Interventions: Electrolyte abnormality - evaluation and management  DETERDING,ELIZABETH 08/12/2014, 4:00 AM

## 2014-08-13 DIAGNOSIS — G9340 Encephalopathy, unspecified: Secondary | ICD-10-CM

## 2014-08-13 LAB — COMPREHENSIVE METABOLIC PANEL
ALT: 36 U/L (ref 0–53)
ANION GAP: 9 (ref 5–15)
AST: 45 U/L — AB (ref 0–37)
Albumin: 3 g/dL — ABNORMAL LOW (ref 3.5–5.2)
Alkaline Phosphatase: 20 U/L — ABNORMAL LOW (ref 39–117)
BUN: 6 mg/dL (ref 6–23)
CALCIUM: 9.1 mg/dL (ref 8.4–10.5)
CO2: 27 mmol/L (ref 19–32)
CREATININE: 0.69 mg/dL (ref 0.50–1.35)
Chloride: 105 mmol/L (ref 96–112)
GFR calc non Af Amer: >90 mL/min (ref >90)
GLUCOSE: 105 mg/dL — AB (ref 70–99)
Potassium: 4.1 mmol/L (ref 3.5–5.1)
Sodium: 141 mmol/L (ref 135–145)
Total Bilirubin: 0.7 mg/dL (ref 0.3–1.2)
Total Protein: 6.2 g/dL (ref 6.0–8.3)

## 2014-08-13 MED ORDER — BUPRENORPHINE HCL 2 MG SL SUBL: 4.0000 mg | SUBLINGUAL_TABLET | Freq: Two times a day (BID) | SUBLINGUAL | Status: DC

## 2014-08-13 MED ORDER — DIAZEPAM 5 MG PO TABS: 5.0000 mg | ORAL_TABLET | Freq: Three times a day (TID) | ORAL | Status: DC

## 2014-08-13 MED ORDER — ATENOLOL 50 MG PO TABS: 50.0000 mg | ORAL_TABLET | Freq: Every day | ORAL | Status: DC

## 2014-08-13 NOTE — Progress Notes (Signed)
Physical Therapy Treatment Patient Details Name: Terrance Cook MRN: [id number] DOB: [birth year] Today's Date: 08/13/2014    History of Present Illness Pt is a 59 year old male who presented from SNF/ALF to [facility] ED 4/13 with AMS, left sided weakness, stupourous. admitted to neurology with possible stroke. In ED was intubated due to periods of apnea. Head CT showed no acute abnormalities but remote ACA infarct. Intubated 4/13-4/14. PMH of seizures, GERD, CAD, DM, HTN, CVA with L side residual weakness.    PT Comments    Pt with noted confusion this date, poor attention, decreased safety awareness, L LE weakness requiring assist for safe mobility. Pt currently unable to return to ALF due to requiring 24/7 supervision due to increased risk of falls and cognitive deficits. Recommend ST-SNF until pt can achieve safe mod I level of function to transfer back to ALF.   Follow Up Recommendations  SNF;Supervision/Assistance - 24 hour     Equipment Recommendations       Recommendations for Other Services       Precautions / Restrictions Precautions Precautions: Fall Required Braces or Orthoses: Other Brace/Splint Other Brace/Splint: Pt has a L knee brace that he wears for support all the time Restrictions Weight Bearing Restrictions: No    Mobility  Bed Mobility Overal bed mobility: Needs Assistance Bed Mobility: Sit to Supine       Sit to supine: Min guard   General bed mobility comments: Pt required assist to pull self up into bed. Pt able to swing legs back into bed but used R LE to assist L LE  Transfers Overall transfer level: Needs assistance Equipment used:  (1 person) Transfers: Sit to/from Stand Sit to Stand: Min assist Stand pivot transfers: Min assist       General transfer comment: pt required max v/c's for safe chair placement. pt attempted to stand up and amb to bed however L knee buckled and required assist x2 to return to chair to prevent fall onto floor. once chair  placed in optiaml position next to the bed pt able to std pvt on R LE with minA for safety.  Ambulation/Gait             General Gait Details: pt non-ambulatory PTA.   Stairs            Wheelchair Mobility    Modified Rankin (Stroke Patients Only)       Balance Overall balance assessment: Needs assistance Sitting-balance support: Feet supported Sitting balance-Leahy Scale: Fair     Standing balance support: Bilateral upper extremity supported Standing balance-Leahy Scale: Poor Standing balance comment: L LE buckles                    Cognition Arousal/Alertness: Lethargic Behavior During Therapy: WFL for tasks assessed/performed Overall Cognitive Status: Impaired/Different from baseline Area of Impairment: Attention;Following commands;Awareness;Problem solving;Safety/judgement   Current Attention Level: Focused Memory: Decreased short-term memory Following Commands: Follows one step commands inconsistently;Follows one step commands with increased time Safety/Judgement: Decreased awareness of safety;Decreased awareness of deficits Awareness: Intellectual Problem Solving: Slow processing;Decreased initiation;Difficulty sequencing;Requires tactile cues;Requires verbal cues General Comments: pt confused. when asked how he would transfer into his w/c he decribed how he rolled up his cigarettes. pt reports date to be March.    Exercises      General Comments        Pertinent Vitals/Pain Pain Assessment: No/denies pain    Home Living  Prior Function            PT Goals (current goals can now be found in the care plan section) Progress towards PT goals: Progressing toward goals    Frequency  Min 3X/week    PT Plan Discharge plan needs to be updated;Frequency needs to be updated    Co-evaluation             End of Session Equipment Utilized During Treatment: Gait belt;Oxygen Activity Tolerance: Patient  tolerated treatment well Patient left: in bed;with call bell/phone within reach;with bed alarm set     Time: [id number] PT Time Calculation (min) (ACUTE ONLY): 19 min  Charges:  $Therapeutic Activity: 8-22 mins                    G Codes:      Marcene BrawnChadwell, Jamilett Ferrante Marie 08/13/2014, 3:07 PM   Lewis ShockAshly Parnika Tweten, PT, DPT Pager #: [phone] Office #: [phone]

## 2014-08-13 NOTE — Progress Notes (Signed)
CARE MANAGEMENT NOTE 08/13/2014  Patient:  Terrance Cook,Terrance Cook   Account Number:  [id number]  Date Initiated:  08/09/2014  Documentation initiated by:  Carlyle LipaBRYSON,MICHELLE  Subjective/Objective Assessment:   AMS--brought to ED where periods of apnea noted; pH 7.2 on ABG--intubated     Action/Plan:   [facility]--likely return if can manage at that level   Anticipated DC Date:  08/14/2014   Anticipated DC Plan:  ASSISTED LIVING / REST HOME  In-house referral  Clinical Social Worker      DC Planning Services  CM consult      Choice offered to / List presented to:          HH arranged  HH-2 PT  HH-5 SPEECH THERAPY      Status of service:  In process, will continue to follow Medicare Important Message given?  YES (If response is "NO", the following Medicare IM given date fields will be blank) Date Medicare IM given:  08/13/2014 Medicare IM given by:  Elmer BalesOBARGE,Sheryl Saintil Date Additional Medicare IM given:   Additional Medicare IM given by:    Discharge Disposition:  ASSISTED LIVING  Per UR Regulation:  Reviewed for med. necessity/level of care/duration of stay  If discussed at Long Length of Stay Meetings, dates discussed:    Comments:  08/13/14 [address] Mirage Pfefferkorn RN, MSN, CM- Medicare IM letter provided. [facility]. CSW was provided with printed copies of the home health orders, which will [facility].

## 2014-08-13 NOTE — Discharge Summary (Signed)
PATIENT DETAILS Name: Terrance Cook Age: 59 y.o. Sex: male Date of Birth: 1955/05/14 MRN: [id number]. Admitting Physician: Merwyn Katos, MD PCP:No PCP Per Patient  Admit Date: 08/08/2014 Discharge date: 08/13/2014  Recommendations for Outpatient Follow-up:  1. Please check chemistries including LFTs and CBC in the next 1-2 weeks. 2. Continue to minimize/adjust medications and minimize polypharmacy 3. Avoid addition of further narcotics or sedatives-please continue to see if we can titrate patient's of some of his usual medications slowly.  PRIMARY DISCHARGE DIAGNOSIS:  Active Problems:   Altered mental status   Left hemiparesis   Diabetes mellitus type 2, controlled, without complications   Essential hypertension   Acute respiratory failure with hypercapnia      PAST MEDICAL HISTORY: Past Medical History  Diagnosis Date  . Stroke   . Coronary artery disease   . History of cardiac cath   . Diabetes mellitus without complication   . Hypertension     DISCHARGE MEDICATIONS: Current Discharge Medication List    START taking these medications   Details  buprenorphine (SUBUTEX) 2 MG SUBL SL tablet Place 2 tablets (4 mg total) under the tongue 2 (two) times daily. Qty: 30 tablet, Refills: 0      CONTINUE these medications which have CHANGED   Details  atenolol (TENORMIN) 50 MG tablet Take 1 tablet (50 mg total) by mouth daily.    diazepam (VALIUM) 5 MG tablet Take 1 tablet (5 mg total) by mouth 3 (three) times daily. HOLD FOR LETHARGY OR SEDATION Qty: 20 tablet, Refills: 0      CONTINUE these medications which have NOT CHANGED   Details  amLODipine (NORVASC) 5 MG tablet Take 10 mg by mouth daily.    aspirin 81 MG chewable tablet Chew 81 mg by mouth daily.    atorvastatin (LIPITOR) 20 MG tablet Take 20 mg by mouth daily.    DULoxetine (CYMBALTA) 60 MG capsule Take 60 mg by mouth daily.    febuxostat (ULORIC) 40 MG tablet Take 40 mg by mouth daily.      fenofibrate 160 MG tablet Take 160 mg by mouth daily.    gabapentin (NEURONTIN) 800 MG tablet Take 800 mg by mouth 2 (two) times daily.     lamoTRIgine (LAMICTAL) 25 MG tablet Take 25 mg by mouth daily.     levETIRAcetam (KEPPRA) 500 MG tablet Take 1,000 mg by mouth 2 (two) times daily.    lidocaine (LIDODERM) 5 % Place 1 patch onto the skin daily. Remove & Discard patch within 12 hours or as directed by MD    metFORMIN (GLUCOPHAGE) 1000 MG tablet Take 1,000 mg by mouth 2 (two) times daily with a meal.    nicotine (NICODERM CQ - DOSED IN MG/24 HOURS) 21 mg/24hr patch Place 21 mg onto the skin daily.    nitroGLYCERIN (NITROSTAT) 0.4 MG SL tablet Place 0.4 mg under the tongue every 5 (five) minutes as needed for chest pain.    omeprazole (PRILOSEC) 20 MG capsule Take 20 mg by mouth daily.    oxybutynin (DITROPAN-XL) 5 MG 24 hr tablet Take 5 mg by mouth at bedtime.    polyethylene glycol (MIRALAX / GLYCOLAX) packet Take 17 g by mouth daily.    QUEtiapine (SEROQUEL) 25 MG tablet Take 25 mg by mouth at bedtime.    diclofenac sodium (VOLTAREN) 1 % GEL Apply 2 g topically 4 (four) times daily. Apply to hip      STOP taking these medications     baclofen (  LIORESAL) 10 MG tablet      benazepril (LOTENSIN) 40 MG tablet      buprenorphine-naloxone (SUBOXONE) 8-2 MG SUBL SL tablet      etodolac (LODINE) 300 MG capsule      furosemide (LASIX) 20 MG tablet      HYDROcodone-acetaminophen (NORCO/VICODIN) 5-325 MG per tablet      hydrOXYzine (ATARAX/VISTARIL) 50 MG tablet      loperamide (IMODIUM A-D) 2 MG tablet      oxyCODONE-acetaminophen (PERCOCET/ROXICET) 5-325 MG per tablet         ALLERGIES:  No Known Allergies  BRIEF HPI:  See H&P, Labs, Consult and Test reports for all details in brief, patient is a 59 year old male with Hx CVA, CAD, DM, and HTN who presented to [facility] ED 4/13 with AMS. He was stuporous and having some L sided weakness. Neurology evaluated him in ED and  ordered CT head, which showed no acute abnormality but remote ACA infarct. He had labored respirations and several periods of apnea and was intubated for airway protection in ED. He is on multiple medications for pain, seizures, and HTN and is from SNF. He was admitted to ICU for ventilator support and monitoring.  CONSULTATIONS:   pulmonary/intensive care and neurology  PERTINENT RADIOLOGIC STUDIES: Ct Angio Head W/cm &/or Wo Cm  08/08/2014   EXAM: CT ANGIOGRAPHY HEAD AND NECK  TECHNIQUE: Multidetector CT imaging of the head and neck was performed using the standard protocol during bolus administration of intravenous contrast. Multiplanar CT image reconstructions and MIPs were obtained to evaluate the vascular anatomy. Carotid stenosis measurements (when applicable) are obtained utilizing NASCET criteria, using the distal internal carotid diameter as the denominator.  COMPARISON:  None.  FINDINGS: CTA NECK  Aortic arch: Visualize aortic arch of normal caliber. Scattered calcified atheromatous plaque present within the aortic arch in at the origin of the great vessels, particularly the left subclavian artery. No high-grade stenosis seen at the origin of the great vessels. Visualized subclavian arteries widely patent.  Right carotid system: Right common carotid artery well opacified from its origin to the carotid bifurcation. Calcified and noncalcified plaque present about the right carotid bifurcation. There is associated short-segment stenosis of approximately 60% by NASCET criteria about the right carotid bifurcation. Distally, the right internal carotid artery is well opacified to the skullbase without stenosis, occlusion, or dissection.  Left carotid system: Left common carotid artery well opacified from its origin to the carotid bifurcation. There is calcified and noncalcified plaque about the left carotid bifurcation extending into the proximal left internal carotid artery. There is associated  irregular short segment stenosis of approximately 50-60% by NASCET criteria within the proximal left ICA. Focal contrast filled outpouching extending from the posterior aspect of the proximal left ICA near the area of greatest stenosis likely reflects a small penetrating atheromatous plaque. This area of atheromatous stenosis and irregularity extends from the left carotid bifurcation, and measures approximately 15 mm in length. Distally, the left ICA is well opacified to the skullbase without stenosis, dissection, or occlusion.  Vertebral arteries:Both vertebral arteries arise from the subclavian arteries. The left vertebral artery is dominant. Vertebral arteries are well opacified along their entire course without evidence for dissection, occlusion, or stenosis. There is focal calcified plaque at the origin of the right vertebral artery. Mild atheromatous plaque present within the distal left vertebral artery as it courses into the cranial vault.  Skeleton: Vertebral bodies are normally aligned with preservation of the normal cervical lordosis.  Mild degenerative disc disease present at C5-6. No acute osseous abnormality. No worrisome lytic or blastic osseous lesion.  Other neck: No acute soft tissue abnormality within the neck. No adenopathy. Visualized superior mediastinum within normal limits. Mild atelectatic changes seen dependently within the visualized lung bases. Endotracheal and enteric tubes in place. Fluid density within the nasopharynx likely related intubation.  CTA HEAD  Anterior circulation: The petrous segments of the internal carotid arteries are widely patent bilaterally. Scattered mild multi focal atheromatous plaque present within the cavernous carotid arteries without significant stenosis. Supraclinoid segments widely patent. A1 segments and anterior communicating artery are widely patent. The anterior cerebral arteries are patent proximally, although the right A2 segment is diminutive, likely  related to prior right ACA infarct.  M1 segments patent bilaterally without proximal branch occlusion or significant stenosis. MCA branches are fairly symmetric bilaterally. No proximal occlusion or embolus identified within the proximal M2 segments.  Posterior circulation: The left vertebral artery is dominant mild to moderate multi focal stenoses. The diminutive right vertebral artery widely patent. Vertebrobasilar junction normal. Posterior inferior cerebral arteries patent bilaterally. Atheromatous irregularity present within the proximal basilar artery without significant stenosis or occlusion. Superior cerebellar arteries are patent bilaterally. Posterior cerebral arteries opacified bilaterally. No arterial branch occlusion upper significant stenosis identified within the intracranial circulation.  Venous sinuses: No acute abnormality identified within the venous sinuses.  Anatomic variants: No anatomic variant.  No aneurysm.  Delayed phase:  No abnormal enhancement on delayed sequences.  IMPRESSION: CTA NECK IMPRESSION:  1. No acute abnormality within the major arterial vasculature of the neck. 2. Multifocal atheromatous plaque about the carotid bifurcations/proximal internal carotid arteries bilaterally. There is associated short-segment stenosis of approximately 60% at the right carotid bifurcation, with short-segment stenosis of approximately 50-60% at the left carotid bifurcation.  CTA HEAD IMPRESSION:  1. No proximal branch occlusion or hemodynamically significant stenosis identified within the intracranial circulation. 2. Diminutive right A2 segment, likely related to prior right ACA infarct. 3. Mild multi focal atheromatous plaque within the cavernous carotid arteries bilaterally without significant stenosis. 4. Mild multi focal atherosclerotic irregularity and stenosis within the distal left V4 segment.   Electronically Signed   By: Rise Mu M.D.   On: 08/08/2014 23:06   Ct Head Wo  Contrast  08/08/2014   CLINICAL DATA:  Stroke.  Left-sided weakness.  EXAM: CT HEAD WITHOUT CONTRAST  TECHNIQUE: Contiguous axial images were obtained from the base of the skull through the vertex without intravenous contrast.  COMPARISON:  06/19/2014  FINDINGS: There is mild diffuse low-attenuation within the subcortical and periventricular white matter compatible with chronic microvascular disease. Large area of encephalomalacia in the distribution of the right ACA territory is again identified compatible with chronic infarct. There is no evidence for acute stroke, intracranial hemorrhage or mass. The paranasal sinuses are clear. The mastoid air cells are also clear. The calvarium is intact.  IMPRESSION: 1. No acute intracranial abnormalities identified. 2. Small vessel ischemic change and brain atrophy. 3. 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   Electronically Signed   By: Signa Kell M.D.   On: 08/08/2014 19:38   Ct Angio Neck W/cm &/or Wo/cm  08/08/2014   EXAM: CT ANGIOGRAPHY HEAD AND NECK  TECHNIQUE: Multidetector CT imaging of the head and neck was performed using the standard protocol during bolus administration of intravenous contrast. Multiplanar CT image reconstructions and MIPs were obtained to evaluate the vascular anatomy. Carotid stenosis measurements (when applicable) are obtained utilizing NASCET criteria, using the  distal internal carotid diameter as the denominator.  COMPARISON:  None.  FINDINGS: CTA NECK  Aortic arch: Visualize aortic arch of normal caliber. Scattered calcified atheromatous plaque present within the aortic arch in at the origin of the great vessels, particularly the left subclavian artery. No high-grade stenosis seen at the origin of the great vessels. Visualized subclavian arteries widely patent.  Right carotid system: Right common carotid artery well opacified from its origin to the carotid bifurcation. Calcified and noncalcified plaque present about the right carotid  bifurcation. There is associated short-segment stenosis of approximately 60% by NASCET criteria about the right carotid bifurcation. Distally, the right internal carotid artery is well opacified to the skullbase without stenosis, occlusion, or dissection.  Left carotid system: Left common carotid artery well opacified from its origin to the carotid bifurcation. There is calcified and noncalcified plaque about the left carotid bifurcation extending into the proximal left internal carotid artery. There is associated irregular short segment stenosis of approximately 50-60% by NASCET criteria within the proximal left ICA. Focal contrast filled outpouching extending from the posterior aspect of the proximal left ICA near the area of greatest stenosis likely reflects a small penetrating atheromatous plaque. This area of atheromatous stenosis and irregularity extends from the left carotid bifurcation, and measures approximately 15 mm in length. Distally, the left ICA is well opacified to the skullbase without stenosis, dissection, or occlusion.  Vertebral arteries:Both vertebral arteries arise from the subclavian arteries. The left vertebral artery is dominant. Vertebral arteries are well opacified along their entire course without evidence for dissection, occlusion, or stenosis. There is focal calcified plaque at the origin of the right vertebral artery. Mild atheromatous plaque present within the distal left vertebral artery as it courses into the cranial vault.  Skeleton: Vertebral bodies are normally aligned with preservation of the normal cervical lordosis. Mild degenerative disc disease present at C5-6. No acute osseous abnormality. No worrisome lytic or blastic osseous lesion.  Other neck: No acute soft tissue abnormality within the neck. No adenopathy. Visualized superior mediastinum within normal limits. Mild atelectatic changes seen dependently within the visualized lung bases. Endotracheal and enteric tubes in  place. Fluid density within the nasopharynx likely related intubation.  CTA HEAD  Anterior circulation: The petrous segments of the internal carotid arteries are widely patent bilaterally. Scattered mild multi focal atheromatous plaque present within the cavernous carotid arteries without significant stenosis. Supraclinoid segments widely patent. A1 segments and anterior communicating artery are widely patent. The anterior cerebral arteries are patent proximally, although the right A2 segment is diminutive, likely related to prior right ACA infarct.  M1 segments patent bilaterally without proximal branch occlusion or significant stenosis. MCA branches are fairly symmetric bilaterally. No proximal occlusion or embolus identified within the proximal M2 segments.  Posterior circulation: The left vertebral artery is dominant mild to moderate multi focal stenoses. The diminutive right vertebral artery widely patent. Vertebrobasilar junction normal. Posterior inferior cerebral arteries patent bilaterally. Atheromatous irregularity present within the proximal basilar artery without significant stenosis or occlusion. Superior cerebellar arteries are patent bilaterally. Posterior cerebral arteries opacified bilaterally. No arterial branch occlusion upper significant stenosis identified within the intracranial circulation.  Venous sinuses: No acute abnormality identified within the venous sinuses.  Anatomic variants: No anatomic variant.  No aneurysm.  Delayed phase:  No abnormal enhancement on delayed sequences.  IMPRESSION: CTA NECK IMPRESSION:  1. No acute abnormality within the major arterial vasculature of the neck. 2. Multifocal atheromatous plaque about the carotid bifurcations/proximal internal carotid arteries bilaterally.  There is associated short-segment stenosis of approximately 60% at the right carotid bifurcation, with short-segment stenosis of approximately 50-60% at the left carotid bifurcation.  CTA HEAD  IMPRESSION:  1. No proximal branch occlusion or hemodynamically significant stenosis identified within the intracranial circulation. 2. Diminutive right A2 segment, likely related to prior right ACA infarct. 3. Mild multi focal atheromatous plaque within the cavernous carotid arteries bilaterally without significant stenosis. 4. Mild multi focal atherosclerotic irregularity and stenosis within the distal left V4 segment.   Electronically Signed   By: Rise Mu M.D.   On: 08/08/2014 23:06   Mr Brain Wo Contrast  08/09/2014   CLINICAL DATA:  Stroke.  Altered mental status.  EXAM: MRI HEAD WITHOUT CONTRAST  TECHNIQUE: Multiplanar, multiecho pulse sequences of the brain and surrounding structures were obtained without intravenous contrast.  COMPARISON:  CT head 08/08/2014  FINDINGS: Image quality degraded by motion  Negative for acute infarct.  Chronic right ACA infarct as noted on the prior CT. Mild chronic microvascular ischemic change in the white matter. Brainstem and cerebellum intact.  Ventricle size is normal.  Cerebral volume is normal for age.  Negative for hemorrhage or mass. No shift of the midline structures.  IMPRESSION: Negative for acute infarct  Chronic right ACA infarct.  Chronic microvascular ischemia.   Electronically Signed   By: Marlan Palau M.D.   On: 08/09/2014 17:41   Dg Chest Port 1 View  08/09/2014   CLINICAL DATA:  Hypoxia  EXAM: PORTABLE CHEST - 1 VIEW  COMPARISON:  August 08, 2014  FINDINGS: Endotracheal tube tip is 2.4 cm above the carina. Nasogastric tube tip and side port are below the diaphragm. No pneumothorax. There is atelectatic change in the left base. Lungs elsewhere clear. Heart is borderline enlarged with pulmonary vascularity within normal limits. No adenopathy.  IMPRESSION: Tube positions as described without pneumothorax. Atelectasis left base. Lungs otherwise clear. No change in cardiac silhouette.   Electronically Signed   By: Bretta Bang III M.D.    On: 08/09/2014 07:59   Dg Chest Portable 1 View  08/08/2014   CLINICAL DATA:  Hypoxia  EXAM: PORTABLE CHEST - 1 VIEW  COMPARISON:  None.  FINDINGS: Endotracheal tube tip is 3.7 cm above the carina. No pneumothorax. Lungs are clear. Heart size and pulmonary vascularity are normal. No adenopathy. No bone lesions.  IMPRESSION: Endotracheal tube as described without pneumothorax. No edema or consolidation.   Electronically Signed   By: Bretta Bang III M.D.   On: 08/08/2014 21:03   Dg Abd Portable 1v  08/09/2014   CLINICAL DATA:  Encounter for OG tube placement.  EXAM: PORTABLE ABDOMEN - 1 VIEW  COMPARISON:  None.  FINDINGS: Tip and side port of the enteric tube below the diaphragm in the stomach. Mild gaseous gastric distention. No dilated bowel loops to suggest obstruction.  IMPRESSION: Tip and side port of the enteric tube below the diaphragm in the stomach.   Electronically Signed   By: Rubye Oaks M.D.   On: 08/09/2014 01:17     PERTINENT LAB RESULTS: CBC:  Recent Labs  08/11/14 0230 08/12/14 0220  WBC 4.8 3.6*  HGB 11.2* 11.9*  HCT 34.4* 36.4*  PLT 141* 147*   CMET CMP     Component Value Date/Time   NA 138 08/12/2014 0220   K 3.8 08/12/2014 0220   CL 101 08/12/2014 0220   CO2 28 08/12/2014 0220   GLUCOSE 116* 08/12/2014 0220   BUN <5* 08/12/2014 0220  CREATININE 0.71 08/12/2014 0220   CALCIUM 8.4 08/12/2014 0220   PROT 7.0 08/08/2014 1905   ALBUMIN 3.9 08/08/2014 1905   AST 84* 08/08/2014 1905   ALT 62* 08/08/2014 1905   ALKPHOS 22* 08/08/2014 1905   BILITOT 0.8 08/08/2014 1905   GFRNONAA >90 08/12/2014 0220   GFRAA >90 08/12/2014 0220    GFR Estimated Creatinine Clearance: 127.5 mL/min (by C-G formula based on Cr of 0.71). No results for input(s): LIPASE, AMYLASE in the last 72 hours. No results for input(s): CKTOTAL, CKMB, CKMBINDEX, TROPONINI in the last 72 hours. Invalid input(s): POCBNP No results for input(s): DDIMER in the last 72 hours. No  results for input(s): HGBA1C in the last 72 hours. No results for input(s): CHOL, HDL, LDLCALC, TRIG, CHOLHDL, LDLDIRECT in the last 72 hours. No results for input(s): TSH, T4TOTAL, T3FREE, THYROIDAB in the last 72 hours.  Invalid input(s): FREET3 No results for input(s): VITAMINB12, FOLATE, FERRITIN, TIBC, IRON, RETICCTPCT in the last 72 hours. Coags: No results for input(s): INR in the last 72 hours.  Invalid input(s): PT Microbiology: Recent Results (from the past 240 hour(s))  Culture, respiratory (NON-Expectorated)     Status: None   Collection Time: 08/09/14  6:55 PM  Result Value Ref Range Status   Specimen Description TRACHEAL ASPIRATE  Final   Special Requests Normal  Final   Gram Stain   Final    MODERATE WBC PRESENT,BOTH PMN AND MONONUCLEAR MODERATE SQUAMOUS EPITHELIAL CELLS PRESENT MODERATE GRAM POSITIVE COCCI IN CLUSTERS IN CHAINS IN PAIRS FEW GRAM POSITIVE RODS RARE GRAM NEGATIVE RODS    Culture   Final    FEW STREPTOCOCCUS,BETA HEMOLYTIC NOT GROUP A Performed at [facility]    Report Status 08/12/2014 FINAL  Final  Culture, blood (routine x 2)     Status: None (Preliminary result)   Collection Time: 08/09/14  7:00 PM  Result Value Ref Range Status   Specimen Description BLOOD LEFT HAND  Final   Special Requests BOTTLES DRAWN AEROBIC ONLY 1CC  Final   Culture   Final           BLOOD CULTURE RECEIVED NO GROWTH TO DATE CULTURE WILL BE HELD FOR 5 DAYS BEFORE ISSUING A FINAL NEGATIVE REPORT Note: Culture results may be compromised due to an inadequate volume of blood received in culture bottles. Performed at [facility]    Report Status PENDING  Incomplete  Culture, blood (routine x 2)     Status: None (Preliminary result)   Collection Time: 08/09/14  7:10 PM  Result Value Ref Range Status   Specimen Description BLOOD RIGHT HAND  Final   Special Requests BOTTLES DRAWN AEROBIC ONLY 1CC  Final   Culture   Final           BLOOD CULTURE RECEIVED  NO GROWTH TO DATE CULTURE WILL BE HELD FOR 5 DAYS BEFORE ISSUING A FINAL NEGATIVE REPORT Note: Culture results may be compromised due to an inadequate volume of blood received in culture bottles. Performed at [facility]    Report Status PENDING  Incomplete  Urine culture     Status: None   Collection Time: 08/10/14  9:48 AM  Result Value Ref Range Status   Specimen Description URINE, CATHETERIZED  Final   Special Requests NONE  Final   Colony Count NO GROWTH Performed at [facility]   Final   Culture NO GROWTH Performed at [facility]   Final   Report Status 08/11/2014  FINAL  Final     BRIEF HOSPITAL COURSE:  Acute hypoxemic respiratory failure in setting Acute Encephalopathy:Felt to be medication related - required intubation on admission, subsequently extubated - no respiratory distress at this time - significantly improved.  Acute encephalopathy: This was felt to be from polypharmacy-reveals patient's medication list reviewed numerous sedatives and narcotics. As noted above above, patient presented with respiratory failure and was intubated. Subsequently medications have been adjusted, currently he appears to be stable on his current regimen (see above new medication list). There appears to be a good balanced green pain control and sedation at this time. CT head was negative on admission, MRI of the brain was also negative for CVA.  Acute delirium: Hospital course was completed by delirium. Patient was slowly placed back on his usual medications, currently he is awake and alert, and answering all my questions appropriately. He has clearly significantly improved. Continue to follow and adjust medications closely while he is at ALF.  History of CVA: Patient has some residual left-sided weakness mostly on his left lower extremity. MRI of the brain done this admission was negative for a new CVA. No further recommendations from neurology. Continue with dysphagia 3  diet with thin liquids. Will need continued physical therapy and speech therapy follow-up while at ALF  Hypotension: Suspected due to from propofol this has resolved, patient has been restarted on some of his antihypertensive medications. Due to follow while at ALF and adjust/titrate medications accordingly  Type 2 diabetes: CBGs continue to be stable. Resume metformin.  Acute renal insufficiency: Likely secondary to prerenal azotemia, this has resolved.  History of seizure disorder: No evidence of seizure activity, continue with antiepileptics.  Mild transaminitis: Suspected from shock liver, please recheck LFTs in the next 1- 2 weeks.  Chronic pain syndrome: Continue with above medications (see med list). Because of acute encephalopathy and respiratory failure on admission-medications have been adjusted. Continue to follow closely while at  ALF  TODAY-DAY OF DISCHARGE:  Subjective:   Terrance Cook today has no headache,no chest abdominal pain,no new weakness tingling or numbness, feels much better wants to go home today.   Objective:   Blood pressure 135/77, pulse 71, temperature 98.7 F (37.1 C), temperature source Oral, resp. rate 20, height 5\' 9"  (1.753 m), weight 112.4 kg (247 lb 12.8 oz), SpO2 95 %.  Intake/Output Summary (Last 24 hours) at 08/13/14 0922 Last data filed at 08/12/14 1900  Gross per 24 hour  Intake    100 ml  Output    550 ml  Net   -450 ml   Filed Weights   08/08/14 2244 08/12/14 1148  Weight: 113 kg (249 lb 1.9 oz) 112.4 kg (247 lb 12.8 oz)    Exam Awake Alert, Oriented *3, No new F.N deficits, Normal affect [place].AT,PERRAL Supple Neck,No JVD, No cervical lymphadenopathy appriciated.  Symmetrical Chest wall movement, Good air movement bilaterally, CTAB RRR,No Gallops,Rubs or new Murmurs, No Parasternal Heave +ve B.Sounds, Abd Soft, Non tender, No organomegaly appriciated, No rebound -guarding or rigidity. No Cyanosis, Clubbing or edema, No new Rash or  bruise  DISCHARGE CONDITION: Stable  DISPOSITION: ALF   DISCHARGE INSTRUCTIONS:    Activity:  As tolerated with Full fall precautions use walker/cane & assistance as needed  Diet recommendation: Diabetic Diet Heart Healthy diet Dysphagia 3 diet with thin liquids Aspiration precautions:yes  Discharge Instructions    Diet - low sodium heart healthy    Complete by:  As directed      Increase  activity slowly    Complete by:  As directed            Follow-up Information    Schedule an appointment as soon as possible for a visit in 2 days to follow up.   Contact information:   PCP at ALF     Total Time spent on discharge equals 45 minutes.  SignedJeoffrey Massed 08/13/2014 9:22 AM

## 2014-08-13 NOTE — Clinical Social Work Note (Signed)
Clinical Social Worker facilitated patient discharge needs.  Clinical information faxed to facility which is agreeable with plan.  CSW [facility].  RN to call report prior to discharge.  DC packet prepared and on chart for transport.   Clinical Social Worker will sign off for now as social work intervention is no longer needed. Please consult us again if new need arises.  Terrance FennelBashira Ky Cook, MSW, [name] [phone] 08/13/2014 4:17 PM

## 2014-08-13 NOTE — Clinical Social Work Note (Signed)
Clinical Social Worker has contacted [facility] in regards to receiving emergency contact information as CSW unable to reach pt's brother, Shanda BumpsMichael Millon listed.   Patient's Contacts are: Philipp Ovensx-Wife, Mary Brill [phone] (left a message) Daughter, Earnstine RegalCaroline Schrader [phone] (disconnected)  [facility] reported they are unable to admit pt back into facility. PT has re-evaluated pt for SNF placement and [facility]. CSW Supervisor notified.   Derenda FennelBashira Jodeci Rini, MSW, [name] [phone] 08/13/2014 4:15 PM

## 2014-08-13 NOTE — Clinical Social Work Psychosocial (Signed)
Clinical Social Work Department BRIEF PSYCHOSOCIAL ASSESSMENT 08/13/2014  Patient:  Terrance Cook,Terrance Cook     Account Number:  [id number]     Admit date:  08/08/2014  Clinical Social Worker:  Derenda FennelNIXON,Johathan Province, CLINICAL SOCIAL WORKER  Date/Time:  08/13/2014 11:36 AM  Referred by:    Date Referred:  08/13/2014 Referred for  ALF Placement   Other Referral:   Interview type:  Other - See comment Other interview type:   CSW spoke with admissions coordinator with Lurena Joinerebecca of [facility].    PSYCHOSOCIAL DATA Living Status:  FACILITY Admitted from facility:  [facility] Level of care:  Assisted Living Primary support name:  Terrance Cook Primary support relationship to patient:  SIBLING Degree of support available:   Unknown    CURRENT CONCERNS Current Concerns  Post-Acute Placement   Other Concerns:    SOCIAL WORK ASSESSMENT / PLAN Clinical Social Worker spoke with admission coordinator [facility](AC), Lurena JoinerRebecca in reference to post acute placement/pt's return to [facility]. AC confirmed that pt is a resident of [facility] and will need to be evaluated by nursing staff prior to returning. AC stated facility is sending a staff member to evaluate pt today for AFL appropriateness. CSW has faxed clinicals including discharge summary to facility and awaiting further instruction.    Patient is lying in bed disoriented X3. CSW attempted to contact pt's brother, Terrance NeedleMichael listed however no success with reaching family. CSW will continue to follow pt for continued support and to facilitate pt's discharge needs once medically stable.     Assessment/plan status:  Psychosocial Support/Ongoing Assessment of Needs Other assessment/ plan:  N/A Information/referral to community resources:  N/A  PATIENT'S/FAMILY'S RESPONSE TO PLAN OF CARE: Pt disoriented. Facility will arrive shortly to evaluate pt and determine if pt is appropiate for return. CSW remains available.    Derenda FennelBashira Keeva Reisen, MSW,  [name] [phone] 08/13/2014 12:03 PM

## 2014-08-13 NOTE — Progress Notes (Signed)
Patient is discharged from room 4N01 at this time. Alert and in stable condition. Several attempts made to give report to [facility].Placed on hold each time and transferred to different unit withpout anyone taking report. Transported via stretcher by [facility] with all belongings at side.

## 2014-08-13 NOTE — Clinical Social Work Placement (Signed)
Clinical Social Work Department CLINICAL SOCIAL WORK PLACEMENT NOTE 08/13/2014  Patient:  Terrance Cook,Terrance Cook  Account Number:  [id number] Admit date:  08/08/2014  Clinical Social Worker:  Derenda FennelBASHIRA Lilymae Swiech, CLINICAL SOCIAL WORKER  Date/time:  08/13/2014 04:08 PM  Clinical Social Work is seeking post-discharge placement for this patient at the following level of care:   SKILLED NURSING   (*CSW will update this form in Epic as items are completed)   08/13/2014  Patient/family provided with [facility] Department of Clinical Social Work's list of facilities offering this level of care within the geographic area requested by the patient (or if unable, by the patient's family).  08/13/2014  Patient/family informed of their freedom to choose among providers that offer the needed level of care, that participate in Medicare, Medicaid or managed care program needed by the patient, have an available bed and are willing to accept the patient.  08/13/2014  Patient/family informed of [facility]' ownership interest in [facility], as well as of the fact that they are under no obligation to receive care at this facility.  PASARR submitted to [employer] on EXISITING PASARR number received on EXISITING  FL2 transmitted to all facilities in geographic area requested by pt/family on  08/13/2014 FL2 transmitted to all facilities within larger geographic area on   Patient informed that his/her managed care company has contracts with or will negotiate with  certain facilities, including the following:   YES     Patient/family informed of bed offers received:  08/13/2014 [facility] Physician recommends and patient chooses bed at    [facility]  08/13/2014 Patient to be transferred to facility by [facility] Patient and family notified of transfer on 08/13/2014 Name of family member notified:  CSW has attempted to contact  patient's family (brother, ex-wife and dtr several times throughout today and unsuccessful at reaching all. Pt is confused. )  The following physician request were entered in Epic:   Additional Comments:   Derenda FennelBashira Javian Nudd, MSW, [name] [phone] 08/13/2014 4:11 PM

## 2014-08-14 ENCOUNTER — Inpatient Hospital Stay (HOSPITAL_COMMUNITY)
Admission: EM | Admit: 2014-08-14 | Discharge: 2014-08-17 | DRG: 480 | Disposition: A | Discharge status: Skilled Nursing Facility | Payer: Medicare Other | Attending: Internal Medicine | Admitting: Internal Medicine

## 2014-08-14 ENCOUNTER — Emergency Department (HOSPITAL_COMMUNITY): Payer: Medicare Other

## 2014-08-14 ENCOUNTER — Encounter (HOSPITAL_COMMUNITY): Payer: Self-pay | Admitting: Emergency Medicine

## 2014-08-14 DIAGNOSIS — E119 Type 2 diabetes mellitus without complications: Secondary | ICD-10-CM | POA: Diagnosis present

## 2014-08-14 DIAGNOSIS — Y92129 Unspecified place in nursing home as the place of occurrence of the external cause: Secondary | ICD-10-CM

## 2014-08-14 DIAGNOSIS — S7292XA Unspecified fracture of left femur, initial encounter for closed fracture: Secondary | ICD-10-CM | POA: Diagnosis present

## 2014-08-14 DIAGNOSIS — S72342A Displaced spiral fracture of shaft of left femur, initial encounter for closed fracture: Principal | ICD-10-CM | POA: Diagnosis present

## 2014-08-14 DIAGNOSIS — E785 Hyperlipidemia, unspecified: Secondary | ICD-10-CM | POA: Diagnosis present

## 2014-08-14 DIAGNOSIS — Z419 Encounter for procedure for purposes other than remedying health state, unspecified: Secondary | ICD-10-CM

## 2014-08-14 DIAGNOSIS — G9340 Encephalopathy, unspecified: Secondary | ICD-10-CM | POA: Diagnosis present

## 2014-08-14 DIAGNOSIS — I2510 Atherosclerotic heart disease of native coronary artery without angina pectoris: Secondary | ICD-10-CM | POA: Diagnosis present

## 2014-08-14 DIAGNOSIS — F17210 Nicotine dependence, cigarettes, uncomplicated: Secondary | ICD-10-CM | POA: Diagnosis present

## 2014-08-14 DIAGNOSIS — D62 Acute posthemorrhagic anemia: Secondary | ICD-10-CM | POA: Diagnosis not present

## 2014-08-14 DIAGNOSIS — G8194 Hemiplegia, unspecified affecting left nondominant side: Secondary | ICD-10-CM | POA: Diagnosis present

## 2014-08-14 DIAGNOSIS — W19XXXS Unspecified fall, sequela: Secondary | ICD-10-CM

## 2014-08-14 DIAGNOSIS — Z79899 Other long term (current) drug therapy: Secondary | ICD-10-CM | POA: Diagnosis not present

## 2014-08-14 DIAGNOSIS — I10 Essential (primary) hypertension: Secondary | ICD-10-CM | POA: Diagnosis present

## 2014-08-14 DIAGNOSIS — Z7982 Long term (current) use of aspirin: Secondary | ICD-10-CM

## 2014-08-14 DIAGNOSIS — G8190 Hemiplegia, unspecified affecting unspecified side: Secondary | ICD-10-CM

## 2014-08-14 DIAGNOSIS — W19XXXA Unspecified fall, initial encounter: Secondary | ICD-10-CM | POA: Diagnosis present

## 2014-08-14 DIAGNOSIS — S7290XA Unspecified fracture of unspecified femur, initial encounter for closed fracture: Secondary | ICD-10-CM | POA: Diagnosis present

## 2014-08-14 DIAGNOSIS — M79606 Pain in leg, unspecified: Secondary | ICD-10-CM

## 2014-08-14 DIAGNOSIS — Z8673 Personal history of transient ischemic attack (TIA), and cerebral infarction without residual deficits: Secondary | ICD-10-CM | POA: Diagnosis not present

## 2014-08-14 DIAGNOSIS — M79605 Pain in left leg: Secondary | ICD-10-CM | POA: Diagnosis present

## 2014-08-14 DIAGNOSIS — S72009A Fracture of unspecified part of neck of unspecified femur, initial encounter for closed fracture: Secondary | ICD-10-CM | POA: Insufficient documentation

## 2014-08-14 LAB — COMPREHENSIVE METABOLIC PANEL
ALBUMIN: 3.6 g/dL (ref 3.5–5.2)
ALT: 40 U/L (ref 0–53)
ANION GAP: 9 (ref 5–15)
AST: 52 U/L — AB (ref 0–37)
Alkaline Phosphatase: 20 U/L — ABNORMAL LOW (ref 39–117)
BILIRUBIN TOTAL: 0.6 mg/dL (ref 0.3–1.2)
BUN: 11 mg/dL (ref 6–23)
CALCIUM: 9.2 mg/dL (ref 8.4–10.5)
CHLORIDE: 104 mmol/L (ref 96–112)
CO2: 26 mmol/L (ref 19–32)
Creatinine, Ser: 0.73 mg/dL (ref 0.50–1.35)
GFR calc Af Amer: >90 mL/min (ref >90)
GFR calc non Af Amer: >90 mL/min (ref >90)
Glucose, Bld: 128 mg/dL — ABNORMAL HIGH (ref 70–99)
Potassium: 4.5 mmol/L (ref 3.5–5.1)
Sodium: 139 mmol/L (ref 135–145)
TOTAL PROTEIN: 7.3 g/dL (ref 6.0–8.3)

## 2014-08-14 LAB — CBC WITH DIFFERENTIAL/PLATELET
BASOS ABS: 0 10*3/uL (ref 0.0–0.1)
Basophils Relative: 0 % (ref 0–1)
EOS ABS: 0.1 10*3/uL (ref 0.0–0.7)
Eosinophils Relative: 1 % (ref 0–5)
HCT: 37.3 % — ABNORMAL LOW (ref 39.0–52.0)
Hemoglobin: 12.1 g/dL — ABNORMAL LOW (ref 13.0–17.0)
LYMPHS ABS: 1.2 10*3/uL (ref 0.7–4.0)
Lymphocytes Relative: 24 % (ref 12–46)
MCH: 28.3 pg (ref 26.0–34.0)
MCHC: 32.4 g/dL (ref 30.0–36.0)
MCV: 87.4 fL (ref 78.0–100.0)
MONO ABS: 0.6 10*3/uL (ref 0.1–1.0)
Monocytes Relative: 11 % (ref 3–12)
Neutro Abs: 3.3 10*3/uL (ref 1.7–7.7)
Neutrophils Relative %: 64 % (ref 43–77)
PLATELETS: UNDETERMINED 10*3/uL (ref 150–400)
RBC: 4.27 MIL/uL (ref 4.22–5.81)
RDW: 13.2 % (ref 11.5–15.5)
WBC: 5.2 10*3/uL (ref 4.0–10.5)

## 2014-08-14 LAB — GLUCOSE, CAPILLARY: Glucose-Capillary: 103 mg/dL — ABNORMAL HIGH (ref 70–99)

## 2014-08-14 LAB — URINALYSIS, ROUTINE W REFLEX MICROSCOPIC
Glucose, UA: NEGATIVE mg/dL
HGB URINE DIPSTICK: NEGATIVE
Ketones, ur: 15 mg/dL — AB
Nitrite: NEGATIVE
PH: 8 (ref 5.0–8.0)
Protein, ur: NEGATIVE mg/dL
Specific Gravity, Urine: 1.018 (ref 1.005–1.030)
Urobilinogen, UA: 2 mg/dL — ABNORMAL HIGH (ref 0.0–1.0)

## 2014-08-14 LAB — URINE MICROSCOPIC-ADD ON

## 2014-08-14 LAB — HEMOGLOBIN A1C
Hgb A1c MFr Bld: 6.4 % — ABNORMAL HIGH (ref 4.8–5.6)
MEAN PLASMA GLUCOSE: 137 mg/dL

## 2014-08-14 LAB — TYPE AND SCREEN
ABO/RH(D): O POS
Antibody Screen: NEGATIVE

## 2014-08-14 LAB — TSH: TSH: 1.176 u[IU]/mL (ref 0.350–4.500)

## 2014-08-14 LAB — PROTIME-INR
INR: 1.11 (ref 0.00–1.49)
PROTHROMBIN TIME: 14.4 s (ref 11.6–15.2)

## 2014-08-14 LAB — ABO/RH: ABO/RH(D): O POS

## 2014-08-14 MED ORDER — SODIUM CHLORIDE 0.9 % IV SOLN
INTRAVENOUS | Status: DC
  Administered 2014-08-14: via INTRAVENOUS

## 2014-08-14 MED ORDER — CHLORHEXIDINE GLUCONATE 4 % EX LIQD
60.0000 mL | Freq: Once | CUTANEOUS | Status: AC
  Administered 2014-08-15: 4 via TOPICAL
  Filled 2014-08-14: qty 60

## 2014-08-14 MED ORDER — ACETAMINOPHEN 650 MG RE SUPP: 650.0000 mg | Freq: Four times a day (QID) | RECTAL | Status: DC | PRN

## 2014-08-14 MED ORDER — QUETIAPINE FUMARATE 25 MG PO TABS
25.0000 mg | ORAL_TABLET | Freq: Every day | ORAL | Status: DC
  Administered 2014-08-14 – 2014-08-16 (×3): 25 mg via ORAL
  Filled 2014-08-14 (×4): qty 1

## 2014-08-14 MED ORDER — DULOXETINE HCL 60 MG PO CPEP
60.0000 mg | ORAL_CAPSULE | Freq: Every day | ORAL | Status: DC
  Administered 2014-08-14 – 2014-08-17 (×4): 60 mg via ORAL
  Filled 2014-08-14 (×4): qty 1

## 2014-08-14 MED ORDER — LAMOTRIGINE 25 MG PO TABS
25.0000 mg | ORAL_TABLET | Freq: Every day | ORAL | Status: DC
  Administered 2014-08-14 – 2014-08-17 (×4): 25 mg via ORAL
  Filled 2014-08-14 (×4): qty 1

## 2014-08-14 MED ORDER — INSULIN ASPART 100 UNIT/ML ~~LOC~~ SOLN
0.0000 [IU] | SUBCUTANEOUS | Status: DC
  Administered 2014-08-16 (×2): 3 [IU] via SUBCUTANEOUS
  Administered 2014-08-16: 2 [IU] via SUBCUTANEOUS

## 2014-08-14 MED ORDER — ATORVASTATIN CALCIUM 10 MG PO TABS
20.0000 mg | ORAL_TABLET | Freq: Every day | ORAL | Status: DC
  Administered 2014-08-15 – 2014-08-17 (×3): 20 mg via ORAL
  Filled 2014-08-14 (×3): qty 2

## 2014-08-14 MED ORDER — MORPHINE SULFATE 2 MG/ML IJ SOLN
1.0000 mg | INTRAMUSCULAR | Status: DC | PRN
  Administered 2014-08-14 – 2014-08-15 (×3): 1 mg via INTRAVENOUS
  Filled 2014-08-14 (×3): qty 1

## 2014-08-14 MED ORDER — CEFAZOLIN SODIUM-DEXTROSE 2-3 GM-% IV SOLR
2.0000 g | INTRAVENOUS | Status: AC
  Administered 2014-08-15: 2 g via INTRAVENOUS
  Filled 2014-08-14: qty 50

## 2014-08-14 MED ORDER — ONDANSETRON HCL 4 MG PO TABS: 4.0000 mg | ORAL_TABLET | Freq: Four times a day (QID) | ORAL | Status: DC | PRN

## 2014-08-14 MED ORDER — SODIUM CHLORIDE 0.9 % IV SOLN
INTRAVENOUS | Status: DC
  Administered 2014-08-14 (×2): via INTRAVENOUS

## 2014-08-14 MED ORDER — ONDANSETRON HCL 4 MG/2ML IJ SOLN: 4.0000 mg | Freq: Four times a day (QID) | INTRAMUSCULAR | Status: DC | PRN

## 2014-08-14 MED ORDER — BISACODYL 5 MG PO TBEC: 5.0000 mg | DELAYED_RELEASE_TABLET | Freq: Every day | ORAL | Status: DC | PRN

## 2014-08-14 MED ORDER — POLYETHYLENE GLYCOL 3350 17 G PO PACK: 17.0000 g | PACK | Freq: Every day | ORAL | Status: DC | PRN

## 2014-08-14 MED ORDER — OXYBUTYNIN CHLORIDE ER 5 MG PO TB24
5.0000 mg | ORAL_TABLET | Freq: Every day | ORAL | Status: DC
  Administered 2014-08-14 – 2014-08-16 (×3): 5 mg via ORAL
  Filled 2014-08-14 (×4): qty 1

## 2014-08-14 MED ORDER — AMLODIPINE BESYLATE 10 MG PO TABS
10.0000 mg | ORAL_TABLET | Freq: Every day | ORAL | Status: DC
  Administered 2014-08-14 – 2014-08-17 (×4): 10 mg via ORAL
  Filled 2014-08-14 (×4): qty 1

## 2014-08-14 MED ORDER — ALUM & MAG HYDROXIDE-SIMETH 200-200-20 MG/5ML PO SUSP: 30.0000 mL | Freq: Four times a day (QID) | ORAL | Status: DC | PRN

## 2014-08-14 MED ORDER — ACETAMINOPHEN 325 MG PO TABS: 650.0000 mg | ORAL_TABLET | Freq: Four times a day (QID) | ORAL | Status: DC | PRN

## 2014-08-14 MED ORDER — OXYCODONE HCL 5 MG PO TABS
5.0000 mg | ORAL_TABLET | ORAL | Status: DC | PRN
  Administered 2014-08-14 – 2014-08-15 (×3): 5 mg via ORAL
  Filled 2014-08-14 (×3): qty 1

## 2014-08-14 MED ORDER — LEVETIRACETAM 500 MG PO TABS
1000.0000 mg | ORAL_TABLET | Freq: Two times a day (BID) | ORAL | Status: DC
  Administered 2014-08-14 – 2014-08-17 (×6): 1000 mg via ORAL
  Filled 2014-08-14 (×6): qty 2

## 2014-08-14 MED ORDER — ATENOLOL 50 MG PO TABS
50.0000 mg | ORAL_TABLET | Freq: Every day | ORAL | Status: DC
  Administered 2014-08-14 – 2014-08-17 (×4): 50 mg via ORAL
  Filled 2014-08-14 (×4): qty 1

## 2014-08-14 NOTE — ED Provider Notes (Signed)
CSN: [id number]     Arrival date & time 08/14/14  4098 History   First MD Initiated Contact with Patient 08/14/14 0327     Chief Complaint  Patient presents with  . Leg Injury     (Consider location/radiation/quality/duration/timing/severity/associated sxs/prior Treatment) HPI Comments: Patient arrives EMS after being found on the floor in his room at [facility], reporting he attempted to ambulate, felt a 'pop' in his left leg and he fell to the floor. The patient is an unreliable historian, unable to articulate what happened earlier. He denies other pain.  He complains of pain in the left thigh only.   The history is provided by the patient and the EMS personnel.    Past Medical History  Diagnosis Date  . Stroke   . Coronary artery disease   . History of cardiac cath   . Diabetes mellitus without complication   . Hypertension    Past Surgical History  Procedure Laterality Date  . Cardiac surgery    . Femur fracture surgery     No family history on file. History  Substance Use Topics  . Smoking status: Current Every Day Smoker -- 0.50 packs/day    Types: Cigarettes  . Smokeless tobacco: Never Used  . Alcohol Use: No    Review of Systems  Unable to perform ROS: Other      Allergies  Review of patient's allergies indicates no known allergies.  Home Medications   Prior to Admission medications   Medication Sig Start Date End Date Taking? Authorizing Provider  amLODipine (NORVASC) 5 MG tablet Take 10 mg by mouth daily.   Yes Historical Provider, MD  aspirin 81 MG chewable tablet Chew 81 mg by mouth daily.   Yes Historical Provider, MD  atenolol (TENORMIN) 50 MG tablet Take 1 tablet (50 mg total) by mouth daily. 08/13/14  Yes Shanker Levora Dredge, MD  atorvastatin (LIPITOR) 20 MG tablet Take 20 mg by mouth daily.   Yes Historical Provider, MD  diazepam (VALIUM) 5 MG tablet Take 1 tablet (5 mg total) by mouth 3 (three) times daily. HOLD FOR LETHARGY OR SEDATION  08/13/14  Yes Shanker Levora Dredge, MD  DULoxetine (CYMBALTA) 60 MG capsule Take 60 mg by mouth daily.   Yes Historical Provider, MD  febuxostat (ULORIC) 40 MG tablet Take 40 mg by mouth daily.   Yes Historical Provider, MD  fenofibrate 160 MG tablet Take 160 mg by mouth daily.   Yes Historical Provider, MD  gabapentin (NEURONTIN) 800 MG tablet Take 800 mg by mouth 2 (two) times daily.    Yes Historical Provider, MD  lamoTRIgine (LAMICTAL) 25 MG tablet Take 25 mg by mouth daily.    Yes Historical Provider, MD  levETIRAcetam (KEPPRA) 500 MG tablet Take 1,000 mg by mouth 2 (two) times daily.   Yes Historical Provider, MD  lidocaine (LIDODERM) 5 % Place 1 patch onto the skin daily. Remove & Discard patch within 12 hours or as directed by MD   Yes Historical Provider, MD  metFORMIN (GLUCOPHAGE) 1000 MG tablet Take 1,000 mg by mouth 2 (two) times daily with a meal.   Yes Historical Provider, MD  nicotine (NICODERM CQ - DOSED IN MG/24 HOURS) 21 mg/24hr patch Place 21 mg onto the skin daily.   Yes Historical Provider, MD  oxybutynin (DITROPAN-XL) 5 MG 24 hr tablet Take 5 mg by mouth at bedtime.   Yes Historical Provider, MD  polyethylene glycol (MIRALAX / GLYCOLAX) packet Take 17 g by mouth  daily.   Yes Historical Provider, MD  QUEtiapine (SEROQUEL) 25 MG tablet Take 25 mg by mouth at bedtime.   Yes Historical Provider, MD  buprenorphine (SUBUTEX) 2 MG SUBL SL tablet Place 2 tablets (4 mg total) under the tongue 2 (two) times daily. Patient not taking: Reported on 08/14/2014 08/13/14   Maretta Bees, MD  nitroGLYCERIN (NITROSTAT) 0.4 MG SL tablet Place 0.4 mg under the tongue every 5 (five) minutes as needed for chest pain.    Historical Provider, MD   BP 152/83 mmHg  Pulse 92  Resp 12  SpO2 93% Physical Exam  Constitutional: He appears well-developed and well-nourished. No distress.  HENT:  Head: Atraumatic.  Neck: Normal range of motion.  Pulmonary/Chest: Effort normal. He exhibits no tenderness.   Abdominal: Soft. There is no tenderness.  Musculoskeletal: He exhibits tenderness. He exhibits no edema.  Deformity of left thigh. Closed injury. Hip/knee non-tender. Distal pulses intact.   Neurological: GCS eye subscore is 4. GCS verbal subscore is 5. GCS motor subscore is 6.  His speech is slurred, largely incoherent, not focused. He is able to follow commands.   Skin: Skin is warm and dry.  Psychiatric: He has a normal mood and affect.    ED Course  Procedures (including critical care time) Labs Review Labs Reviewed - No data to display Results for orders placed or performed during the hospital encounter of 08/14/14  Surgical pcr screen  Result Value Ref Range   MRSA, PCR NEGATIVE NEGATIVE   Staphylococcus aureus NEGATIVE NEGATIVE  Comprehensive metabolic panel  Result Value Ref Range   Sodium 139 135 - 145 mmol/L   Potassium 4.5 3.5 - 5.1 mmol/L   Chloride 104 96 - 112 mmol/L   CO2 26 19 - 32 mmol/L   Glucose, Bld 128 (H) 70 - 99 mg/dL   BUN 11 6 - 23 mg/dL   Creatinine, Ser 1.61 0.50 - 1.35 mg/dL   Calcium 9.2 8.4 - 09.6 mg/dL   Total Protein 7.3 6.0 - 8.3 g/dL   Albumin 3.6 3.5 - 5.2 g/dL   AST 52 (H) 0 - 37 U/L   ALT 40 0 - 53 U/L   Alkaline Phosphatase 20 (L) 39 - 117 U/L   Total Bilirubin 0.6 0.3 - 1.2 mg/dL   GFR calc non Af Amer >90 >90 mL/min   GFR calc Af Amer >90 >90 mL/min   Anion gap 9 5 - 15  CBC with Differential  Result Value Ref Range   WBC 5.2 4.0 - 10.5 K/uL   RBC 4.27 4.22 - 5.81 MIL/uL   Hemoglobin 12.1 (L) 13.0 - 17.0 g/dL   HCT 04.5 (L) 40.9 - 81.1 %   MCV 87.4 78.0 - 100.0 fL   MCH 28.3 26.0 - 34.0 pg   MCHC 32.4 30.0 - 36.0 g/dL   RDW 91.4 78.2 - 95.6 %   Platelets PLATELET CLUMPS NOTED ON SMEAR, UNABLE TO ESTIMATE 150 - 400 K/uL   Neutrophils Relative % 64 43 - 77 %   Lymphocytes Relative 24 12 - 46 %   Monocytes Relative 11 3 - 12 %   Eosinophils Relative 1 0 - 5 %   Basophils Relative 0 0 - 1 %   Neutro Abs 3.3 1.7 - 7.7 K/uL    Lymphs Abs 1.2 0.7 - 4.0 K/uL   Monocytes Absolute 0.6 0.1 - 1.0 K/uL   Eosinophils Absolute 0.1 0.0 - 0.7 K/uL   Basophils Absolute 0.0 0.0 -  0.1 K/uL   Smear Review MORPHOLOGY UNREMARKABLE   Protime-INR  Result Value Ref Range   Prothrombin Time 14.4 11.6 - 15.2 seconds   INR 1.11 0.00 - 1.49  TSH  Result Value Ref Range   TSH 1.176 0.350 - 4.500 uIU/mL  Urinalysis, Routine w reflex microscopic  Result Value Ref Range   Color, Urine YELLOW YELLOW   APPearance TURBID (A) CLEAR   Specific Gravity, Urine 1.018 1.005 - 1.030   pH 8.0 5.0 - 8.0   Glucose, UA NEGATIVE NEGATIVE mg/dL   Hgb urine dipstick NEGATIVE NEGATIVE   Bilirubin Urine SMALL (A) NEGATIVE   Ketones, ur 15 (A) NEGATIVE mg/dL   Protein, ur NEGATIVE NEGATIVE mg/dL   Urobilinogen, UA 2.0 (H) 0.0 - 1.0 mg/dL   Nitrite NEGATIVE NEGATIVE   Leukocytes, UA TRACE (A) NEGATIVE  Urine microscopic-add on  Result Value Ref Range   Squamous Epithelial / LPF RARE RARE   WBC, UA 0-2 <3 WBC/hpf   RBC / HPF 3-6 <3 RBC/hpf   Bacteria, UA RARE RARE   Urine-Other AMORPHOUS URATES/PHOSPHATES   Glucose, capillary  Result Value Ref Range   Glucose-Capillary 103 (H) 70 - 99 mg/dL   Comment 1 Notify RN   Glucose, capillary  Result Value Ref Range   Glucose-Capillary 100 (H) 70 - 99 mg/dL   Comment 1 Notify RN   Type and screen  Result Value Ref Range   ABO/RH(D) O POS    Antibody Screen NEG    Sample Expiration 08/17/2014   ABO/Rh  Result Value Ref Range   ABO/RH(D) O POS    Ct Angio Head W/cm &/or Wo Cm  08/08/2014   EXAM: CT ANGIOGRAPHY HEAD AND NECK  TECHNIQUE: Multidetector CT imaging of the head and neck was performed using the standard protocol during bolus administration of intravenous contrast. Multiplanar CT image reconstructions and MIPs were obtained to evaluate the vascular anatomy. Carotid stenosis measurements (when applicable) are obtained utilizing NASCET criteria, using the distal internal carotid diameter as  the denominator.  COMPARISON:  None.  FINDINGS: CTA NECK  Aortic arch: Visualize aortic arch of normal caliber. Scattered calcified atheromatous plaque present within the aortic arch in at the origin of the great vessels, particularly the left subclavian artery. No high-grade stenosis seen at the origin of the great vessels. Visualized subclavian arteries widely patent.  Right carotid system: Right common carotid artery well opacified from its origin to the carotid bifurcation. Calcified and noncalcified plaque present about the right carotid bifurcation. There is associated short-segment stenosis of approximately 60% by NASCET criteria about the right carotid bifurcation. Distally, the right internal carotid artery is well opacified to the skullbase without stenosis, occlusion, or dissection.  Left carotid system: Left common carotid artery well opacified from its origin to the carotid bifurcation. There is calcified and noncalcified plaque about the left carotid bifurcation extending into the proximal left internal carotid artery. There is associated irregular short segment stenosis of approximately 50-60% by NASCET criteria within the proximal left ICA. Focal contrast filled outpouching extending from the posterior aspect of the proximal left ICA near the area of greatest stenosis likely reflects a small penetrating atheromatous plaque. This area of atheromatous stenosis and irregularity extends from the left carotid bifurcation, and measures approximately 15 mm in length. Distally, the left ICA is well opacified to the skullbase without stenosis, dissection, or occlusion.  Vertebral arteries:Both vertebral arteries arise from the subclavian arteries. The left vertebral artery is dominant. Vertebral arteries are  well opacified along their entire course without evidence for dissection, occlusion, or stenosis. There is focal calcified plaque at the origin of the right vertebral artery. Mild atheromatous plaque  present within the distal left vertebral artery as it courses into the cranial vault.  Skeleton: Vertebral bodies are normally aligned with preservation of the normal cervical lordosis. Mild degenerative disc disease present at C5-6. No acute osseous abnormality. No worrisome lytic or blastic osseous lesion.  Other neck: No acute soft tissue abnormality within the neck. No adenopathy. Visualized superior mediastinum within normal limits. Mild atelectatic changes seen dependently within the visualized lung bases. Endotracheal and enteric tubes in place. Fluid density within the nasopharynx likely related intubation.  CTA HEAD  Anterior circulation: The petrous segments of the internal carotid arteries are widely patent bilaterally. Scattered mild multi focal atheromatous plaque present within the cavernous carotid arteries without significant stenosis. Supraclinoid segments widely patent. A1 segments and anterior communicating artery are widely patent. The anterior cerebral arteries are patent proximally, although the right A2 segment is diminutive, likely related to prior right ACA infarct.  M1 segments patent bilaterally without proximal branch occlusion or significant stenosis. MCA branches are fairly symmetric bilaterally. No proximal occlusion or embolus identified within the proximal M2 segments.  Posterior circulation: The left vertebral artery is dominant mild to moderate multi focal stenoses. The diminutive right vertebral artery widely patent. Vertebrobasilar junction normal. Posterior inferior cerebral arteries patent bilaterally. Atheromatous irregularity present within the proximal basilar artery without significant stenosis or occlusion. Superior cerebellar arteries are patent bilaterally. Posterior cerebral arteries opacified bilaterally. No arterial branch occlusion upper significant stenosis identified within the intracranial circulation.  Venous sinuses: No acute abnormality identified within the  venous sinuses.  Anatomic variants: No anatomic variant.  No aneurysm.  Delayed phase:  No abnormal enhancement on delayed sequences.  IMPRESSION: CTA NECK IMPRESSION:  1. No acute abnormality within the major arterial vasculature of the neck. 2. Multifocal atheromatous plaque about the carotid bifurcations/proximal internal carotid arteries bilaterally. There is associated short-segment stenosis of approximately 60% at the right carotid bifurcation, with short-segment stenosis of approximately 50-60% at the left carotid bifurcation.  CTA HEAD IMPRESSION:  1. No proximal branch occlusion or hemodynamically significant stenosis identified within the intracranial circulation. 2. Diminutive right A2 segment, likely related to prior right ACA infarct. 3. Mild multi focal atheromatous plaque within the cavernous carotid arteries bilaterally without significant stenosis. 4. Mild multi focal atherosclerotic irregularity and stenosis within the distal left V4 segment.   Electronically Signed   By: Rise Mu M.D.   On: 08/08/2014 23:06   Dg Chest 1 View  08/14/2014   CLINICAL DATA:  Fall.  Shortness of breath.  EXAM: CHEST  1 VIEW  COMPARISON:  08/09/2014.  FINDINGS: Interim extubation and removal of NG tube. Mediastinum hilar structures normal. Cardiomegaly with normal pulmonary vascularity. Low lung volumes with mild subsegmental atelectasis. No pleural effusion or pneumothorax. Stable cardiomegaly with normal pulmonary vascularity. Old left posterior sixth rib fracture.  IMPRESSION: 1. Interval removal of endotracheal tube and NG tube. 2. Low lung volumes with mild basilar atelectasis. 3. Stable cardiomegaly.   Electronically Signed   By: Maisie Fus  Register   On: 08/14/2014 07:21   Ct Head Wo Contrast  08/08/2014   CLINICAL DATA:  Stroke.  Left-sided weakness.  EXAM: CT HEAD WITHOUT CONTRAST  TECHNIQUE: Contiguous axial images were obtained from the base of the skull through the vertex without intravenous  contrast.  COMPARISON:  06/19/2014  FINDINGS: There  is mild diffuse low-attenuation within the subcortical and periventricular white matter compatible with chronic microvascular disease. Large area of encephalomalacia in the distribution of the right ACA territory is again identified compatible with chronic infarct. There is no evidence for acute stroke, intracranial hemorrhage or mass. The paranasal sinuses are clear. The mastoid air cells are also clear. The calvarium is intact.  IMPRESSION: 1. No acute intracranial abnormalities identified. 2. Small vessel ischemic change and brain atrophy. 3. 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   Electronically Signed   By: Signa Kell M.D.   On: 08/08/2014 19:38   Ct Angio Neck W/cm &/or Wo/cm  08/08/2014   EXAM: CT ANGIOGRAPHY HEAD AND NECK  TECHNIQUE: Multidetector CT imaging of the head and neck was performed using the standard protocol during bolus administration of intravenous contrast. Multiplanar CT image reconstructions and MIPs were obtained to evaluate the vascular anatomy. Carotid stenosis measurements (when applicable) are obtained utilizing NASCET criteria, using the distal internal carotid diameter as the denominator.  COMPARISON:  None.  FINDINGS: CTA NECK  Aortic arch: Visualize aortic arch of normal caliber. Scattered calcified atheromatous plaque present within the aortic arch in at the origin of the great vessels, particularly the left subclavian artery. No high-grade stenosis seen at the origin of the great vessels. Visualized subclavian arteries widely patent.  Right carotid system: Right common carotid artery well opacified from its origin to the carotid bifurcation. Calcified and noncalcified plaque present about the right carotid bifurcation. There is associated short-segment stenosis of approximately 60% by NASCET criteria about the right carotid bifurcation. Distally, the right internal carotid artery is well opacified to the skullbase without  stenosis, occlusion, or dissection.  Left carotid system: Left common carotid artery well opacified from its origin to the carotid bifurcation. There is calcified and noncalcified plaque about the left carotid bifurcation extending into the proximal left internal carotid artery. There is associated irregular short segment stenosis of approximately 50-60% by NASCET criteria within the proximal left ICA. Focal contrast filled outpouching extending from the posterior aspect of the proximal left ICA near the area of greatest stenosis likely reflects a small penetrating atheromatous plaque. This area of atheromatous stenosis and irregularity extends from the left carotid bifurcation, and measures approximately 15 mm in length. Distally, the left ICA is well opacified to the skullbase without stenosis, dissection, or occlusion.  Vertebral arteries:Both vertebral arteries arise from the subclavian arteries. The left vertebral artery is dominant. Vertebral arteries are well opacified along their entire course without evidence for dissection, occlusion, or stenosis. There is focal calcified plaque at the origin of the right vertebral artery. Mild atheromatous plaque present within the distal left vertebral artery as it courses into the cranial vault.  Skeleton: Vertebral bodies are normally aligned with preservation of the normal cervical lordosis. Mild degenerative disc disease present at C5-6. No acute osseous abnormality. No worrisome lytic or blastic osseous lesion.  Other neck: No acute soft tissue abnormality within the neck. No adenopathy. Visualized superior mediastinum within normal limits. Mild atelectatic changes seen dependently within the visualized lung bases. Endotracheal and enteric tubes in place. Fluid density within the nasopharynx likely related intubation.  CTA HEAD  Anterior circulation: The petrous segments of the internal carotid arteries are widely patent bilaterally. Scattered mild multi focal  atheromatous plaque present within the cavernous carotid arteries without significant stenosis. Supraclinoid segments widely patent. A1 segments and anterior communicating artery are widely patent. The anterior cerebral arteries are patent proximally, although the right A2 segment  is diminutive, likely related to prior right ACA infarct.  M1 segments patent bilaterally without proximal branch occlusion or significant stenosis. MCA branches are fairly symmetric bilaterally. No proximal occlusion or embolus identified within the proximal M2 segments.  Posterior circulation: The left vertebral artery is dominant mild to moderate multi focal stenoses. The diminutive right vertebral artery widely patent. Vertebrobasilar junction normal. Posterior inferior cerebral arteries patent bilaterally. Atheromatous irregularity present within the proximal basilar artery without significant stenosis or occlusion. Superior cerebellar arteries are patent bilaterally. Posterior cerebral arteries opacified bilaterally. No arterial branch occlusion upper significant stenosis identified within the intracranial circulation.  Venous sinuses: No acute abnormality identified within the venous sinuses.  Anatomic variants: No anatomic variant.  No aneurysm.  Delayed phase:  No abnormal enhancement on delayed sequences.  IMPRESSION: CTA NECK IMPRESSION:  1. No acute abnormality within the major arterial vasculature of the neck. 2. Multifocal atheromatous plaque about the carotid bifurcations/proximal internal carotid arteries bilaterally. There is associated short-segment stenosis of approximately 60% at the right carotid bifurcation, with short-segment stenosis of approximately 50-60% at the left carotid bifurcation.  CTA HEAD IMPRESSION:  1. No proximal branch occlusion or hemodynamically significant stenosis identified within the intracranial circulation. 2. Diminutive right A2 segment, likely related to prior right ACA infarct. 3. Mild  multi focal atheromatous plaque within the cavernous carotid arteries bilaterally without significant stenosis. 4. Mild multi focal atherosclerotic irregularity and stenosis within the distal left V4 segment.   Electronically Signed   By: Rise Mu M.D.   On: 08/08/2014 23:06   Mr Brain Wo Contrast  08/09/2014   CLINICAL DATA:  Stroke.  Altered mental status.  EXAM: MRI HEAD WITHOUT CONTRAST  TECHNIQUE: Multiplanar, multiecho pulse sequences of the brain and surrounding structures were obtained without intravenous contrast.  COMPARISON:  CT head 08/08/2014  FINDINGS: Image quality degraded by motion  Negative for acute infarct.  Chronic right ACA infarct as noted on the prior CT. Mild chronic microvascular ischemic change in the white matter. Brainstem and cerebellum intact.  Ventricle size is normal.  Cerebral volume is normal for age.  Negative for hemorrhage or mass. No shift of the midline structures.  IMPRESSION: Negative for acute infarct  Chronic right ACA infarct.  Chronic microvascular ischemia.   Electronically Signed   By: Marlan Palau M.D.   On: 08/09/2014 17:41   Dg Chest Port 1 View  08/09/2014   CLINICAL DATA:  Hypoxia  EXAM: PORTABLE CHEST - 1 VIEW  COMPARISON:  August 08, 2014  FINDINGS: Endotracheal tube tip is 2.4 cm above the carina. Nasogastric tube tip and side port are below the diaphragm. No pneumothorax. There is atelectatic change in the left base. Lungs elsewhere clear. Heart is borderline enlarged with pulmonary vascularity within normal limits. No adenopathy.  IMPRESSION: Tube positions as described without pneumothorax. Atelectasis left base. Lungs otherwise clear. No change in cardiac silhouette.   Electronically Signed   By: Bretta Bang III M.D.   On: 08/09/2014 07:59   Dg Chest Portable 1 View  08/08/2014   CLINICAL DATA:  Hypoxia  EXAM: PORTABLE CHEST - 1 VIEW  COMPARISON:  None.  FINDINGS: Endotracheal tube tip is 3.7 cm above the carina. No pneumothorax.  Lungs are clear. Heart size and pulmonary vascularity are normal. No adenopathy. No bone lesions.  IMPRESSION: Endotracheal tube as described without pneumothorax. No edema or consolidation.   Electronically Signed   By: Bretta Bang III M.D.   On: 08/08/2014 21:03  Dg Abd Portable 1v  08/09/2014   CLINICAL DATA:  Encounter for OG tube placement.  EXAM: PORTABLE ABDOMEN - 1 VIEW  COMPARISON:  None.  FINDINGS: Tip and side port of the enteric tube below the diaphragm in the stomach. Mild gaseous gastric distention. No dilated bowel loops to suggest obstruction.  IMPRESSION: Tip and side port of the enteric tube below the diaphragm in the stomach.   Electronically Signed   By: Rubye Oaks M.D.   On: 08/09/2014 01:17   Dg Hip Unilat With Pelvis 2-3 Views Left  08/14/2014   CLINICAL DATA:  Fall.  Hip pain  EXAM: LEFT HIP (WITH PELVIS) 2-3 VIEWS  COMPARISON:  None.  FINDINGS: Spiral fracture mid left femur below the prosthesis.  Chronic intertrochanteric fracture on the left is healed. Compression screw and rod in the proximal femur in good position. Left hip joint is normal.  Right hip joint is normal.  No other fracture.  IMPRESSION: Spiral fracture mid left femur.  Left femur is reported separately.   Electronically Signed   By: Marlan Palau M.D.   On: 08/14/2014 07:21   Dg Femur Min 2 Views Left  08/14/2014   CLINICAL DATA:  Fall, severe leg pain and foreshortening.  EXAM: LEFT FEMUR 2 VIEWS  COMPARISON:  Contemporaneously pelvis/hip radiographs  FINDINGS: Spiral fracture through the mid femoral shaft 2.5 cm below the level of the intra medullary rod tip. Medial and posterior displacement of the distal component. Underlying osteopenia.  IMPRESSION: Oblique/spiral fracture of the mid left femoral shaft as above.   Electronically Signed   By: Jearld Lesch M.D.   On: 08/14/2014 07:22   Imaging Review No results found.   EKG Interpretation None      MDM   Final diagnoses:  Lower  extremity pain    1. Femur fracture  The patient cannot contribute to history. He cannot give details of fall. He denies any previous history of hip surgery, although there is visualized plates over left hip, c/w history of baseline confusion. Previous orthopedist unknown.  Discussed the patient with nursing staff at [facility] who reports he was new to their care last night. He arrived confused as per his known history, and this was unchanged at the time of fall.   He is found to have a spiral fracture of femur. Immobilization ordered. Patient to be admitted, requested orthopedics consultation. Patient care left with Jaynie Crumble, PA-C, with final radiology interpretation and consults pending.   Elpidio Anis, PA-C 08/15/14 0355  Gilda Crease, MD 08/17/14 [phone]

## 2014-08-14 NOTE — Consult Note (Signed)
ORTHOPAEDIC CONSULTATION  REQUESTING PHYSICIAN: Jeralyn Bennett, MD  Chief Complaint: Left femur fx  HPI: Terrance Cook is a 59 y.o. male who complains of left femur pain after feeling pop in his leg.  Patient is not oriented and cannot provide any history.  Lives at [facility].  Ortho consulted for left femur periprosthetic fx.  Past Medical History  Diagnosis Date  . Stroke   . Coronary artery disease   . History of cardiac cath   . Diabetes mellitus without complication   . Hypertension    Past Surgical History  Procedure Laterality Date  . Cardiac surgery    . Femur fracture surgery     History   Social History  . Marital Status: Divorced    Spouse Name: N/A  . Number of Children: N/A  . Years of Education: N/A   Social History Main Topics  . Smoking status: Current Every Day Smoker -- 0.50 packs/day for 45 years    Types: Cigarettes  . Smokeless tobacco: Never Used  . Alcohol Use: No  . Drug Use: No  . Sexual Activity: No   Other Topics Concern  . None   Social History Narrative   History reviewed. No pertinent family history. No Known Allergies Prior to Admission medications   Medication Sig Start Date End Date Taking? Authorizing Provider  amLODipine (NORVASC) 5 MG tablet Take 10 mg by mouth daily.   Yes Historical Provider, MD  aspirin 81 MG chewable tablet Chew 81 mg by mouth daily.   Yes Historical Provider, MD  atenolol (TENORMIN) 50 MG tablet Take 1 tablet (50 mg total) by mouth daily. 08/13/14  Yes Shanker Levora Dredge, MD  atorvastatin (LIPITOR) 20 MG tablet Take 20 mg by mouth daily.   Yes Historical Provider, MD  diazepam (VALIUM) 5 MG tablet Take 1 tablet (5 mg total) by mouth 3 (three) times daily. HOLD FOR LETHARGY OR SEDATION 08/13/14  Yes Shanker Levora Dredge, MD  DULoxetine (CYMBALTA) 60 MG capsule Take 60 mg by mouth daily.   Yes Historical Provider, MD  febuxostat (ULORIC) 40 MG tablet Take 40 mg by mouth daily.   Yes Historical  Provider, MD  fenofibrate 160 MG tablet Take 160 mg by mouth daily.   Yes Historical Provider, MD  gabapentin (NEURONTIN) 800 MG tablet Take 800 mg by mouth 2 (two) times daily.    Yes Historical Provider, MD  lamoTRIgine (LAMICTAL) 25 MG tablet Take 25 mg by mouth daily.    Yes Historical Provider, MD  levETIRAcetam (KEPPRA) 500 MG tablet Take 1,000 mg by mouth 2 (two) times daily.   Yes Historical Provider, MD  lidocaine (LIDODERM) 5 % Place 1 patch onto the skin daily. Remove & Discard patch within 12 hours or as directed by MD   Yes Historical Provider, MD  metFORMIN (GLUCOPHAGE) 1000 MG tablet Take 1,000 mg by mouth 2 (two) times daily with a meal.   Yes Historical Provider, MD  nicotine (NICODERM CQ - DOSED IN MG/24 HOURS) 21 mg/24hr patch Place 21 mg onto the skin daily.   Yes Historical Provider, MD  oxybutynin (DITROPAN-XL) 5 MG 24 hr tablet Take 5 mg by mouth at bedtime.   Yes Historical Provider, MD  polyethylene glycol (MIRALAX / GLYCOLAX) packet Take 17 g by mouth daily.   Yes Historical Provider, MD  QUEtiapine (SEROQUEL) 25 MG tablet Take 25 mg by mouth at bedtime.   Yes Historical Provider, MD  buprenorphine (SUBUTEX) 2 MG SUBL SL tablet  Place 2 tablets (4 mg total) under the tongue 2 (two) times daily. Patient not taking: Reported on 08/14/2014 08/13/14   Maretta BeesShanker M Ghimire, MD  nitroGLYCERIN (NITROSTAT) 0.4 MG SL tablet Place 0.4 mg under the tongue every 5 (five) minutes as needed for chest pain.    Historical Provider, MD   Dg Chest 1 View  08/14/2014   CLINICAL DATA:  Fall.  Shortness of breath.  EXAM: CHEST  1 VIEW  COMPARISON:  08/09/2014.  FINDINGS: Interim extubation and removal of NG tube. Mediastinum hilar structures normal. Cardiomegaly with normal pulmonary vascularity. Low lung volumes with mild subsegmental atelectasis. No pleural effusion or pneumothorax. Stable cardiomegaly with normal pulmonary vascularity. Old left posterior sixth rib fracture.  IMPRESSION: 1. Interval  removal of endotracheal tube and NG tube. 2. Low lung volumes with mild basilar atelectasis. 3. Stable cardiomegaly.   Electronically Signed   By: Maisie Fushomas  Register   On: 08/14/2014 07:21   Dg Hip Unilat With Pelvis 2-3 Views Left  08/14/2014   CLINICAL DATA:  Fall.  Hip pain  EXAM: LEFT HIP (WITH PELVIS) 2-3 VIEWS  COMPARISON:  None.  FINDINGS: Spiral fracture mid left femur below the prosthesis.  Chronic intertrochanteric fracture on the left is healed. Compression screw and rod in the proximal femur in good position. Left hip joint is normal.  Right hip joint is normal.  No other fracture.  IMPRESSION: Spiral fracture mid left femur.  Left femur is reported separately.   Electronically Signed   By: Marlan Palauharles  Clark M.D.   On: 08/14/2014 07:21   Dg Femur Min 2 Views Left  08/14/2014   CLINICAL DATA:  Fall, severe leg pain and foreshortening.  EXAM: LEFT FEMUR 2 VIEWS  COMPARISON:  Contemporaneously pelvis/hip radiographs  FINDINGS: Spiral fracture through the mid femoral shaft 2.5 cm below the level of the intra medullary rod tip. Medial and posterior displacement of the distal component. Underlying osteopenia.  IMPRESSION: Oblique/spiral fracture of the mid left femoral shaft as above.   Electronically Signed   By: Jearld LeschAndrew  DelGaizo M.D.   On: 08/14/2014 07:22    Positive ROS: All other systems have been reviewed and were otherwise negative with the exception of those mentioned in the HPI and as above.  Physical Exam: General: NAD Cardiovascular: No pedal edema Respiratory: No cyanosis, no use of accessory musculature GI: No organomegaly, abdomen is soft and non-tender Skin: No lesions in the area of chief complaint Neurologic: Sensation intact distally Psychiatric: Patient is not competent for consent Lymphatic: No axillary or cervical lymphadenopathy  MUSCULOSKELETAL:  - gross deformity of left thigh - skin intact - distal pulses intact - foot wwp - moving toes  spontaneously  Assessment: Left periprosthetic femur fx  Plan: - surgery tomorrow - NPO after midnight - will need consent from brother  Thank you for the consult and the opportunity to see Mr. Francesco RunnerHollar  N. Glee ArvinMichael Xu, MD [facility] [phone] 5:15 PM

## 2014-08-14 NOTE — ED Provider Notes (Signed)
Pt signed out to me at shift change. Pt with recent discharge from hospital for AMS, placed in SNF yesterday where he had unwitnessed fall. Here with left hip injury.     Spiral left femur fx on xray. Will call orthopedics.    7:59 AM Spoke with Dr. Roda ShuttersXu, asked [facility]  Spoke with [facility], [facility] come [facility] and [facility].   Filed Vitals:   08/14/14 0900 08/14/14 0930 08/14/14 1102 08/14/14 1226  BP: 128/82 147/94 169/91 147/90  Pulse: 92 87 87 97  Temp:   98 F (36.7 C) 97.5 F (36.4 C)  TempSrc:      Resp: 15 15 16 16   SpO2: 95% 96% 94% 93%     Jaynie Crumbleatyana Mehlani Blankenburg, PA-C 08/14/14 1640  Rolland PorterMark Kwinton, MD 08/17/14 [phone]

## 2014-08-14 NOTE — Progress Notes (Signed)
Continues to pull on his Foley, states in slurred voice, "Gotta pee." Bladder scan 0. Additional 400 in Foley bag.

## 2014-08-14 NOTE — ED Notes (Signed)
Bed: ZO10WA19 Expected date:  Expected time:  Means of arrival:  Comments: EMS knee dislocation

## 2014-08-14 NOTE — Progress Notes (Signed)
Telephone contact was made with home of his brother, Shanda BumpsMichael Goh [phone]. Spoke with his wife - updated her regarding his brother's condition and requested he call to provide permission for surgery. She stated he would call when returns from the store.

## 2014-08-14 NOTE — H&P (Signed)
[facility] History and Physical  Terrance Cook ZOX:[id number] DOB: 02-02-1956 DOA: 08/14/2014  Referring physician:  PCP: No PCP Per Patient   Chief Complaint: Status post fall  HPI: Terrance Cook is a 59 y.o. male with a past medical history of hypertension, diabetes mellitus, he was discharged from the medicine service on 08/13/2014, admitted on 08/09/2014 when he presented with acute encephalopathy and acute hypoxemic respiratory failure likely medication related, requiring intubation on admission. Patient had been on multiple psychotropic medications. Head CT was negative on admission. This was further worked up with an MRI of brain which did not show evidence of acute CVA. He was discharged in stable condition to [facility]. During my evaluation patient is sedated and cannot provide history. History was obtained from emergency room staff. Overnight he was found on the floor of his room. Patient stating  attempting to ambulate then feeling a "pop" in his leg, and causing him to fall to the floor. He cannot bear weight after his fall for which EMS was called. X-ray showed oblique/spiral fracture of the mid left femoral shaft. Dr Roda Shutters of orthopedic surgery was consulted who requested patient is transferred to [facility] to receive further treatment. I spoke with Dr Susie Cassette of the medicine service who has accepted patient in transfer to that facility.                                                                                                                                                                                                                              Review of Systems:  Could not obtain a reliable review of systems, patient sedated   Past Medical History  Diagnosis Date  . Stroke   . Coronary artery disease   . History of cardiac cath   . Diabetes mellitus without complication   . Hypertension    Past Surgical History  Procedure Laterality Date  .  Cardiac surgery    . Femur fracture surgery     Social History:  reports that he has been smoking Cigarettes.  He has been smoking about 0.50 packs per day. He has never used smokeless tobacco. He reports that he does not drink alcohol or use illicit drugs.  No Known Allergies  No family history on file. patient is sedated, he cannot provide a history  Prior to Admission medications   Medication Sig Start Date End Date Taking? Authorizing Provider  amLODipine (NORVASC) 5 MG tablet  Take 10 mg by mouth daily.   Yes Historical Provider, MD  aspirin 81 MG chewable tablet Chew 81 mg by mouth daily.   Yes Historical Provider, MD  atenolol (TENORMIN) 50 MG tablet Take 1 tablet (50 mg total) by mouth daily. 08/13/14  Yes Shanker Levora DredgeM Ghimire, MD  atorvastatin (LIPITOR) 20 MG tablet Take 20 mg by mouth daily.   Yes Historical Provider, MD  diazepam (VALIUM) 5 MG tablet Take 1 tablet (5 mg total) by mouth 3 (three) times daily. HOLD FOR LETHARGY OR SEDATION 08/13/14  Yes Shanker Levora DredgeM Ghimire, MD  DULoxetine (CYMBALTA) 60 MG capsule Take 60 mg by mouth daily.   Yes Historical Provider, MD  febuxostat (ULORIC) 40 MG tablet Take 40 mg by mouth daily.   Yes Historical Provider, MD  fenofibrate 160 MG tablet Take 160 mg by mouth daily.   Yes Historical Provider, MD  gabapentin (NEURONTIN) 800 MG tablet Take 800 mg by mouth 2 (two) times daily.    Yes Historical Provider, MD  lamoTRIgine (LAMICTAL) 25 MG tablet Take 25 mg by mouth daily.    Yes Historical Provider, MD  levETIRAcetam (KEPPRA) 500 MG tablet Take 1,000 mg by mouth 2 (two) times daily.   Yes Historical Provider, MD  lidocaine (LIDODERM) 5 % Place 1 patch onto the skin daily. Remove & Discard patch within 12 hours or as directed by MD   Yes Historical Provider, MD  metFORMIN (GLUCOPHAGE) 1000 MG tablet Take 1,000 mg by mouth 2 (two) times daily with a meal.   Yes Historical Provider, MD  nicotine (NICODERM CQ - DOSED IN MG/24 HOURS) 21 mg/24hr patch  Place 21 mg onto the skin daily.   Yes Historical Provider, MD  oxybutynin (DITROPAN-XL) 5 MG 24 hr tablet Take 5 mg by mouth at bedtime.   Yes Historical Provider, MD  polyethylene glycol (MIRALAX / GLYCOLAX) packet Take 17 g by mouth daily.   Yes Historical Provider, MD  QUEtiapine (SEROQUEL) 25 MG tablet Take 25 mg by mouth at bedtime.   Yes Historical Provider, MD  buprenorphine (SUBUTEX) 2 MG SUBL SL tablet Place 2 tablets (4 mg total) under the tongue 2 (two) times daily. Patient not taking: Reported on 08/14/2014 08/13/14   Maretta BeesShanker M Ghimire, MD  nitroGLYCERIN (NITROSTAT) 0.4 MG SL tablet Place 0.4 mg under the tongue every 5 (five) minutes as needed for chest pain.    Historical Provider, MD   Physical Exam: Filed Vitals:   08/14/14 0542 08/14/14 0800 08/14/14 0808 08/14/14 0830  BP: 152/83 137/91 137/91 145/87  Pulse: 92 88 82 86  Temp:   97.6 F (36.4 C)   TempSrc:   Oral   Resp: 12 13 8 12   SpO2: 93% 96% 97% 96%    Wt Readings from Last 3 Encounters:  08/12/14 112.4 kg (247 lb 12.8 oz)    General:  Patient is confused and disoriented, unable to provide accurate history, he is arousable however and could follow simple commands for me. Eyes: PERRL, normal lids, irises & conjunctiva ENT: grossly normal hearing, lips & tongue, dry oral mucosa Neck: no LAD, masses or thyromegaly Cardiovascular: RRR, no m/r/g. No LE edema. Telemetry: SR, no arrhythmias  Respiratory: CTA bilaterally, no w/r/r. Normal respiratory effort. Abdomen: soft, ntnd Skin: no rash or induration seen on limited exam Musculoskeletal: His left lower extremity is externally rotated,  Psychiatric: grossly normal mood and affect, speech fluent and appropriate Neurologic: it is difficult to perform an adequate neurologic examination as patient  is sedated after receiving narcotic analgesics overnight          Labs on Admission:  Basic Metabolic Panel:  Recent Labs Lab 08/10/14 0312 08/11/14 0230  08/12/14 0220 08/13/14 0806 08/14/14 0701  NA 139 139 138 141 139  K 4.6 3.9 3.8 4.1 4.5  CL 101 103 101 105 104  CO2 29 29 28 27 26   GLUCOSE 100* 131* 116* 105* 128*  BUN 13 9 <5* 6 11  CREATININE 1.15 0.82 0.71 0.69 0.73  CALCIUM 8.7 8.6 8.4 9.1 9.2  MG  --   --  1.3*  --   --   PHOS  --   --  3.6  --   --    Liver Function Tests:  Recent Labs Lab 08/08/14 1905 08/13/14 0806 08/14/14 0701  AST 84* 45* 52*  ALT 62* 36 40  ALKPHOS 22* 20* 20*  BILITOT 0.8 0.7 0.6  PROT 7.0 6.2 7.3  ALBUMIN 3.9 3.0* 3.6   No results for input(s): LIPASE, AMYLASE in the last 168 hours. No results for input(s): AMMONIA in the last 168 hours. CBC:  Recent Labs Lab 08/08/14 1905 08/08/14 1912 08/09/14 0305 08/11/14 0230 08/12/14 0220 08/14/14 0701  WBC 7.2  --  7.3 4.8 3.6* 5.2  NEUTROABS 3.6  --   --   --   --  3.3  HGB 13.0 15.0 13.3 11.2* 11.9* 12.1*  HCT 39.6 44.0 39.6 34.4* 36.4* 37.3*  MCV 86.3  --  85.7 86.2 86.3 87.4  PLT 247  --  107* 141* 147* PLATELET CLUMPS NOTED ON SMEAR, UNABLE TO ESTIMATE   Cardiac Enzymes: No results for input(s): CKTOTAL, CKMB, CKMBINDEX, TROPONINI in the last 168 hours.  BNP (last 3 results) No results for input(s): BNP in the last 8760 hours.  ProBNP (last 3 results) No results for input(s): PROBNP in the last 8760 hours.  CBG:  Recent Labs Lab 08/10/14 1913 08/10/14 2311 08/11/14 0320 08/11/14 0718 08/11/14 1122  GLUCAP 117* 105* 119* 117* 143*    Radiological Exams on Admission: Dg Chest 1 View  08/14/2014   CLINICAL DATA:  Fall.  Shortness of breath.  EXAM: CHEST  1 VIEW  COMPARISON:  08/09/2014.  FINDINGS: Interim extubation and removal of NG tube. Mediastinum hilar structures normal. Cardiomegaly with normal pulmonary vascularity. Low lung volumes with mild subsegmental atelectasis. No pleural effusion or pneumothorax. Stable cardiomegaly with normal pulmonary vascularity. Old left posterior sixth rib fracture.  IMPRESSION: 1.  Interval removal of endotracheal tube and NG tube. 2. Low lung volumes with mild basilar atelectasis. 3. Stable cardiomegaly.   Electronically Signed   By: Maisie Fus  Register   On: 08/14/2014 07:21   Dg Hip Unilat With Pelvis 2-3 Views Left  08/14/2014   CLINICAL DATA:  Fall.  Hip pain  EXAM: LEFT HIP (WITH PELVIS) 2-3 VIEWS  COMPARISON:  None.  FINDINGS: Spiral fracture mid left femur below the prosthesis.  Chronic intertrochanteric fracture on the left is healed. Compression screw and rod in the proximal femur in good position. Left hip joint is normal.  Right hip joint is normal.  No other fracture.  IMPRESSION: Spiral fracture mid left femur.  Left femur is reported separately.   Electronically Signed   By: Marlan Palau M.D.   On: 08/14/2014 07:21   Dg Femur Min 2 Views Left  08/14/2014   CLINICAL DATA:  Fall, severe leg pain and foreshortening.  EXAM: LEFT FEMUR 2 VIEWS  COMPARISON:  Contemporaneously pelvis/hip  radiographs  FINDINGS: Spiral fracture through the mid femoral shaft 2.5 cm below the level of the intra medullary rod tip. Medial and posterior displacement of the distal component. Underlying osteopenia.  IMPRESSION: Oblique/spiral fracture of the mid left femoral shaft as above.   Electronically Signed   By: Jearld Lesch M.D.   On: 08/14/2014 07:22    EKG: Independently reviewed.   Assessment/Plan Principal Problem:   Femur fracture, left Active Problems:   Acute encephalopathy   Left hemiparesis   Diabetes mellitus type 2, controlled, without complications   Essential hypertension   1. Left femur fracture. Patient is a 59 year old recently discharged from the hospitalist service on 08/13/2014 to skilled nursing facility, having a fall overnight unable to bear weight. Imaging studies performed in the emergency room showing oblique/spiral fracture involving the mid left femoral shaft. Dr. Roda Shutters of orthopedic surgery was consulted who requested transfer to [facility] to  receive further care. I have discussed case with Dr Susie Cassette of the medicine service who agreed to except patient in transfer. Will keep patient nothing by mouth, provide IV fluids with normal saline running at 75 mL/hour.  2.  Acute Encephalopathy.  Patient recently treated for significant mental status changes that resulted in respiratory failure likely caused by drug overdose from multiple psychotropic medications. We'll monitor closely for oversedation, particularly with his left femur fracture and managing pain symptoms.  3.  Status post fall. Patient having a fall at his skilled nursing facility. He could not tell me the mechanism of his fall in the emergency room given confusion, per EMR patient got up overnight and felt a "pop" to his leg, causing him to fall. Orthopedic surgery consulted.  4. Type 2 diabetes mellitus. Will hold metformin  therapy since he is to be made nothing by mouth, perform Accu-Cheks every 4 hours with sliding scale coverage. 5. Hypertension. Blood pressures are stable, continue atenolol 50 mg by mouth every daily along with Norvasc 10 mg by mouth daily 6. Dyslipidemia. Continue statin therapy 7. DVT prophylaxis. SCDs    Code Status: Full Code DVT Prophylaxis:SCD Family Communication: No family available Disposition Plan: Will transfer patient to [facility], admit to the inpatient service  Time spent: 70 min  Jeralyn Bennett [facility] Pager [phone]

## 2014-08-14 NOTE — ED Notes (Signed)
[facility] for [facility]. Per EMS patient was ambulating and "heard three pops". Edema and shortening.   VS: 13/84, 96% RA, CBG 86, 84 HR, 250mcg fentanyl in route. 20G Left hand.

## 2014-08-14 NOTE — ED Notes (Signed)
Per [facility], hold pt until seen by him.,

## 2014-08-14 NOTE — ED Notes (Signed)
3 assist - removed clothing, placed brace.  Ortho at bedside to assist.  Immobilizer applied.  Pt responded to pain.

## 2014-08-15 ENCOUNTER — Encounter (HOSPITAL_COMMUNITY): Payer: Self-pay | Admitting: Anesthesiology

## 2014-08-15 ENCOUNTER — Encounter (HOSPITAL_COMMUNITY)
Admission: EM | Disposition: A | Discharge status: Skilled Nursing Facility | Payer: Self-pay | Source: Home / Self Care | Attending: Internal Medicine

## 2014-08-15 ENCOUNTER — Inpatient Hospital Stay (HOSPITAL_COMMUNITY): Payer: Medicare Other | Admitting: Certified Registered Nurse Anesthetist

## 2014-08-15 ENCOUNTER — Inpatient Hospital Stay (HOSPITAL_COMMUNITY): Payer: Medicare Other

## 2014-08-15 DIAGNOSIS — S7290XA Unspecified fracture of unspecified femur, initial encounter for closed fracture: Secondary | ICD-10-CM | POA: Diagnosis present

## 2014-08-15 HISTORY — PX: HARDWARE REMOVAL: SHX979

## 2014-08-15 HISTORY — PX: FEMUR IM NAIL: SHX1597

## 2014-08-15 LAB — GLUCOSE, CAPILLARY
GLUCOSE-CAPILLARY: 104 mg/dL — AB (ref 70–99)
GLUCOSE-CAPILLARY: 102 mg/dL — AB (ref 70–99)
GLUCOSE-CAPILLARY: 103 mg/dL — AB (ref 70–99)
Glucose-Capillary: 100 mg/dL — ABNORMAL HIGH (ref 70–99)
Glucose-Capillary: 100 mg/dL — ABNORMAL HIGH (ref 70–99)
Glucose-Capillary: 107 mg/dL — ABNORMAL HIGH (ref 70–99)
Glucose-Capillary: 133 mg/dL — ABNORMAL HIGH (ref 70–99)
Glucose-Capillary: 92 mg/dL (ref 70–99)

## 2014-08-15 LAB — CBC
HEMATOCRIT: 34 % — AB (ref 39.0–52.0)
HEMOGLOBIN: 11 g/dL — AB (ref 13.0–17.0)
MCH: 27.8 pg (ref 26.0–34.0)
MCHC: 32.4 g/dL (ref 30.0–36.0)
MCV: 86.1 fL (ref 78.0–100.0)
Platelets: 207 10*3/uL (ref 150–400)
RBC: 3.95 MIL/uL — ABNORMAL LOW (ref 4.22–5.81)
RDW: 13 % (ref 11.5–15.5)
WBC: 4.8 10*3/uL (ref 4.0–10.5)

## 2014-08-15 LAB — BASIC METABOLIC PANEL
ANION GAP: 9 (ref 5–15)
BUN: 8 mg/dL (ref 6–23)
CALCIUM: 8.8 mg/dL (ref 8.4–10.5)
CO2: 25 mmol/L (ref 19–32)
Chloride: 105 mmol/L (ref 96–112)
Creatinine, Ser: 0.62 mg/dL (ref 0.50–1.35)
GFR calc Af Amer: >90 mL/min (ref >90)
GFR calc non Af Amer: >90 mL/min (ref >90)
GLUCOSE: 99 mg/dL (ref 70–99)
POTASSIUM: 3.7 mmol/L (ref 3.5–5.1)
SODIUM: 139 mmol/L (ref 135–145)

## 2014-08-15 LAB — SURGICAL PCR SCREEN
MRSA, PCR: NEGATIVE
STAPHYLOCOCCUS AUREUS: NEGATIVE

## 2014-08-15 SURGERY — INSERTION, INTRAMEDULLARY ROD, FEMUR
Anesthesia: General | Site: Leg Upper | Laterality: Left

## 2014-08-15 MED ORDER — OXYCODONE HCL 5 MG PO TABS: 5.0000 mg | ORAL_TABLET | ORAL | Status: DC | PRN

## 2014-08-15 MED ORDER — ONDANSETRON HCL 4 MG/2ML IJ SOLN
INTRAMUSCULAR | Status: DC | PRN
  Administered 2014-08-15: 4 mg via INTRAVENOUS

## 2014-08-15 MED ORDER — OXYCODONE HCL 5 MG PO TABS: 5.0000 mg | ORAL_TABLET | Freq: Once | ORAL | Status: DC | PRN

## 2014-08-15 MED ORDER — PHENOL 1.4 % MT LIQD: 1.0000 | OROMUCOSAL | Status: DC | PRN

## 2014-08-15 MED ORDER — ACETAMINOPHEN 650 MG RE SUPP: 650.0000 mg | Freq: Four times a day (QID) | RECTAL | Status: DC | PRN

## 2014-08-15 MED ORDER — CEFAZOLIN SODIUM-DEXTROSE 2-3 GM-% IV SOLR
2.0000 g | Freq: Four times a day (QID) | INTRAVENOUS | Status: AC
  Administered 2014-08-16 (×3): 2 g via INTRAVENOUS
  Filled 2014-08-15 (×3): qty 50

## 2014-08-15 MED ORDER — ONDANSETRON HCL 4 MG/2ML IJ SOLN
INTRAMUSCULAR | Status: AC
  Filled 2014-08-15: qty 2

## 2014-08-15 MED ORDER — ONDANSETRON HCL 4 MG PO TABS: 4.0000 mg | ORAL_TABLET | Freq: Four times a day (QID) | ORAL | Status: DC | PRN

## 2014-08-15 MED ORDER — ENOXAPARIN SODIUM 40 MG/0.4ML ~~LOC~~ SOLN
40.0000 mg | SUBCUTANEOUS | Status: DC
  Administered 2014-08-16 – 2014-08-17 (×2): 40 mg via SUBCUTANEOUS
  Filled 2014-08-15 (×2): qty 0.4

## 2014-08-15 MED ORDER — ONDANSETRON HCL 4 MG/2ML IJ SOLN: 4.0000 mg | Freq: Four times a day (QID) | INTRAMUSCULAR | Status: DC | PRN

## 2014-08-15 MED ORDER — PROPOFOL 10 MG/ML IV BOLUS
INTRAVENOUS | Status: DC | PRN
  Administered 2014-08-15: 200 mg via INTRAVENOUS
  Administered 2014-08-15: 50 mg via INTRAVENOUS

## 2014-08-15 MED ORDER — METHOCARBAMOL 500 MG PO TABS
500.0000 mg | ORAL_TABLET | Freq: Four times a day (QID) | ORAL | Status: DC | PRN
  Administered 2014-08-16 – 2014-08-17 (×5): 500 mg via ORAL
  Filled 2014-08-15 (×5): qty 1

## 2014-08-15 MED ORDER — 0.9 % SODIUM CHLORIDE (POUR BTL) OPTIME
TOPICAL | Status: DC | PRN
Start: 2014-08-15 — End: 2014-08-15
  Administered 2014-08-15: 1000 mL

## 2014-08-15 MED ORDER — ENOXAPARIN SODIUM 40 MG/0.4ML ~~LOC~~ SOLN: 40.0000 mg | Freq: Every day | SUBCUTANEOUS | Status: DC

## 2014-08-15 MED ORDER — ROCURONIUM BROMIDE 50 MG/5ML IV SOLN
INTRAVENOUS | Status: AC
  Filled 2014-08-15: qty 1

## 2014-08-15 MED ORDER — POLYETHYLENE GLYCOL 3350 17 G PO PACK: 17.0000 g | PACK | Freq: Every day | ORAL | Status: DC | PRN

## 2014-08-15 MED ORDER — LIDOCAINE HCL (CARDIAC) 20 MG/ML IV SOLN
INTRAVENOUS | Status: AC
  Filled 2014-08-15: qty 5

## 2014-08-15 MED ORDER — FENTANYL CITRATE (PF) 100 MCG/2ML IJ SOLN
INTRAMUSCULAR | Status: DC | PRN
  Administered 2014-08-15: 50 ug via INTRAVENOUS
  Administered 2014-08-15: 100 ug via INTRAVENOUS
  Administered 2014-08-15 (×2): 50 ug via INTRAVENOUS

## 2014-08-15 MED ORDER — HYDROCODONE-ACETAMINOPHEN 5-325 MG PO TABS
1.0000 | ORAL_TABLET | Freq: Four times a day (QID) | ORAL | Status: DC | PRN
  Administered 2014-08-16 – 2014-08-17 (×4): 2 via ORAL
  Filled 2014-08-15 (×4): qty 2

## 2014-08-15 MED ORDER — OXYCODONE HCL 5 MG/5ML PO SOLN: 5.0000 mg | Freq: Once | ORAL | Status: DC | PRN

## 2014-08-15 MED ORDER — MIDAZOLAM HCL 2 MG/2ML IJ SOLN
INTRAMUSCULAR | Status: AC
  Filled 2014-08-15: qty 2

## 2014-08-15 MED ORDER — ROCURONIUM BROMIDE 100 MG/10ML IV SOLN
INTRAVENOUS | Status: DC | PRN
  Administered 2014-08-15: 50 mg via INTRAVENOUS

## 2014-08-15 MED ORDER — ALUM & MAG HYDROXIDE-SIMETH 200-200-20 MG/5ML PO SUSP: 30.0000 mL | ORAL | Status: DC | PRN

## 2014-08-15 MED ORDER — LACTATED RINGERS IV SOLN
INTRAVENOUS | Status: DC
  Administered 2014-08-15: 16:00:00 via INTRAVENOUS

## 2014-08-15 MED ORDER — METOCLOPRAMIDE HCL 5 MG PO TABS: 5.0000 mg | ORAL_TABLET | Freq: Three times a day (TID) | ORAL | Status: DC | PRN

## 2014-08-15 MED ORDER — NEOSTIGMINE METHYLSULFATE 10 MG/10ML IV SOLN
INTRAVENOUS | Status: DC | PRN
  Administered 2014-08-15: 3 mg via INTRAVENOUS

## 2014-08-15 MED ORDER — FENTANYL CITRATE (PF) 250 MCG/5ML IJ SOLN
INTRAMUSCULAR | Status: AC
  Filled 2014-08-15: qty 5

## 2014-08-15 MED ORDER — ACETAMINOPHEN 325 MG PO TABS: 650.0000 mg | ORAL_TABLET | Freq: Four times a day (QID) | ORAL | Status: DC | PRN

## 2014-08-15 MED ORDER — HYDROMORPHONE HCL 1 MG/ML IJ SOLN: 0.2500 mg | INTRAMUSCULAR | Status: DC | PRN

## 2014-08-15 MED ORDER — METHOCARBAMOL 1000 MG/10ML IJ SOLN: 500.0000 mg | Freq: Four times a day (QID) | INTRAVENOUS | Status: DC | PRN

## 2014-08-15 MED ORDER — LIDOCAINE HCL (CARDIAC) 20 MG/ML IV SOLN
INTRAVENOUS | Status: DC | PRN
  Administered 2014-08-15: 80 mg via INTRAVENOUS

## 2014-08-15 MED ORDER — MENTHOL 3 MG MT LOZG: 1.0000 | LOZENGE | OROMUCOSAL | Status: DC | PRN

## 2014-08-15 MED ORDER — MORPHINE SULFATE 2 MG/ML IJ SOLN
0.5000 mg | INTRAMUSCULAR | Status: DC | PRN
  Administered 2014-08-17: 0.5 mg via INTRAVENOUS
  Filled 2014-08-15: qty 1

## 2014-08-15 MED ORDER — LACTATED RINGERS IV SOLN
INTRAVENOUS | Status: DC | PRN
  Administered 2014-08-15 (×2): via INTRAVENOUS

## 2014-08-15 MED ORDER — GLYCOPYRROLATE 0.2 MG/ML IJ SOLN
INTRAMUSCULAR | Status: DC | PRN
  Administered 2014-08-15: 0.4 mg via INTRAVENOUS

## 2014-08-15 MED ORDER — METOCLOPRAMIDE HCL 5 MG/ML IJ SOLN
5.0000 mg | Freq: Three times a day (TID) | INTRAMUSCULAR | Status: DC | PRN
Start: 2014-08-15 — End: 2014-08-17

## 2014-08-15 MED ORDER — OXYCODONE HCL 5 MG PO TABS
5.0000 mg | ORAL_TABLET | ORAL | Status: DC | PRN
  Administered 2014-08-16 – 2014-08-17 (×4): 10 mg via ORAL
  Filled 2014-08-15 (×4): qty 2

## 2014-08-15 MED ORDER — DEXAMETHASONE SODIUM PHOSPHATE 4 MG/ML IJ SOLN
INTRAMUSCULAR | Status: AC
  Filled 2014-08-15: qty 2

## 2014-08-15 MED ORDER — SODIUM CHLORIDE 0.9 % IV SOLN
INTRAVENOUS | Status: DC
Start: 2014-08-15 — End: 2014-08-17
  Administered 2014-08-16: 04:00:00 via INTRAVENOUS

## 2014-08-15 MED ORDER — DEXAMETHASONE SODIUM PHOSPHATE 4 MG/ML IJ SOLN
INTRAMUSCULAR | Status: DC | PRN
  Administered 2014-08-15: 8 mg via INTRAVENOUS

## 2014-08-15 MED ORDER — PROPOFOL 10 MG/ML IV BOLUS
INTRAVENOUS | Status: AC
  Filled 2014-08-15: qty 20

## 2014-08-15 SURGICAL SUPPLY — 58 items
BIT DRILL SHORT 4.0 (BIT) ×4 IMPLANT
BNDG COHESIVE 4X5 TAN STRL (GAUZE/BANDAGES/DRESSINGS) ×8 IMPLANT
COVER PERINEAL POST (MISCELLANEOUS) ×4 IMPLANT
COVER SURGICAL LIGHT HANDLE (MISCELLANEOUS) ×4 IMPLANT
DRAPE C-ARM 42X72 X-RAY (DRAPES) IMPLANT
DRAPE C-ARMOR (DRAPES) ×4 IMPLANT
DRAPE INCISE IOBAN 66X45 STRL (DRAPES) ×4 IMPLANT
DRAPE ORTHO SPLIT 77X108 STRL (DRAPES)
DRAPE PROXIMA HALF (DRAPES) IMPLANT
DRAPE STERI IOBAN 125X83 (DRAPES) ×4 IMPLANT
DRAPE SURG ORHT 6 SPLT 77X108 (DRAPES) IMPLANT
DRAPE U-SHAPE 47X51 STRL (DRAPES) IMPLANT
DRILL BIT SHORT 4.0 (BIT) ×4
DRSG EMULSION OIL 3X3 NADH (GAUZE/BANDAGES/DRESSINGS) IMPLANT
DRSG MEPILEX BORDER 4X4 (GAUZE/BANDAGES/DRESSINGS) ×8 IMPLANT
DRSG MEPILEX BORDER 4X8 (GAUZE/BANDAGES/DRESSINGS) ×4 IMPLANT
DURAPREP 26ML APPLICATOR (WOUND CARE) ×4 IMPLANT
ELECT CAUTERY BLADE 6.4 (BLADE) ×4 IMPLANT
ELECT REM PT RETURN 9FT ADLT (ELECTROSURGICAL) ×4
ELECTRODE REM PT RTRN 9FT ADLT (ELECTROSURGICAL) ×2 IMPLANT
FACESHIELD WRAPAROUND (MASK) ×4 IMPLANT
GLOVE BIO SURGEON STRL SZ 6 (GLOVE) ×4 IMPLANT
GLOVE BIO SURGEON STRL SZ 6.5 (GLOVE) ×3 IMPLANT
GLOVE BIO SURGEON STRL SZ7 (GLOVE) ×4 IMPLANT
GLOVE BIO SURGEONS STRL SZ 6.5 (GLOVE) ×1
GLOVE BIOGEL PI IND STRL 6.5 (GLOVE) ×2 IMPLANT
GLOVE BIOGEL PI IND STRL 7.0 (GLOVE) ×2 IMPLANT
GLOVE BIOGEL PI IND STRL 7.5 (GLOVE) ×2 IMPLANT
GLOVE BIOGEL PI INDICATOR 6.5 (GLOVE) ×2
GLOVE BIOGEL PI INDICATOR 7.0 (GLOVE) ×2
GLOVE BIOGEL PI INDICATOR 7.5 (GLOVE) ×2
GLOVE NEODERM STRL 7.5 LF PF (GLOVE) ×2 IMPLANT
GLOVE SURG NEODERM 7.5  LF PF (GLOVE) ×2
GOWN STRL REIN XL XLG (GOWN DISPOSABLE) ×4 IMPLANT
GOWN STRL REUS W/ TWL LRG LVL3 (GOWN DISPOSABLE) ×6 IMPLANT
GOWN STRL REUS W/TWL LRG LVL3 (GOWN DISPOSABLE) ×6
GUIDE PIN 3.2X343 (PIN) ×2
GUIDE PIN 3.2X343MM (PIN) ×2
GUIDE ROD 3.0 (MISCELLANEOUS) ×4
KIT BASIN OR (CUSTOM PROCEDURE TRAY) ×4 IMPLANT
KIT ROOM TURNOVER OR (KITS) ×4 IMPLANT
LINER BOOT UNIVERSAL DISP (MISCELLANEOUS) ×4 IMPLANT
MANIFOLD NEPTUNE II (INSTRUMENTS) IMPLANT
NAIL INTERTAN LEFT 11.5X40 (Nail) ×4 IMPLANT
NS IRRIG 1000ML POUR BTL (IV SOLUTION) ×4 IMPLANT
PACK GENERAL/GYN (CUSTOM PROCEDURE TRAY) ×4 IMPLANT
PAD ARMBOARD 7.5X6 YLW CONV (MISCELLANEOUS) ×8 IMPLANT
PIN GUIDE 3.2X343MM (PIN) ×2 IMPLANT
ROD GUIDE 3.0 (MISCELLANEOUS) ×2 IMPLANT
SCREW LAG COMPR KIT 90/85 (Screw) ×4 IMPLANT
SCREW TRIGEN LOW PROF 5.0X50 (Screw) ×4 IMPLANT
SCREW TRIGEN LOW PROF 5.0X60 (Screw) ×4 IMPLANT
STAPLER VISISTAT 35W (STAPLE) ×4 IMPLANT
STOCKINETTE IMPERVIOUS 9X36 MD (GAUZE/BANDAGES/DRESSINGS) IMPLANT
SUT VIC AB 0 CT1 27 (SUTURE) ×4
SUT VIC AB 0 CT1 27XBRD ANBCTR (SUTURE) ×4 IMPLANT
SUT VIC AB 2-0 CT1 27 (SUTURE) ×2
SUT VIC AB 2-0 CT1 TAPERPNT 27 (SUTURE) ×2 IMPLANT

## 2014-08-15 NOTE — Progress Notes (Signed)
[facility] PROGRESS NOTE  Terrance Cook NWG:[id number] DOB: 1955/07/27 DOA: 08/14/2014 PCP: No PCP Per Patient  Assessment/Plan: #1 left periprosthetic femur fracture Secondary to mechanical fall. Patient for surgery today. Orthopedics following.   #2 acute encephalopathy Patient recently treated for significant mental status changes that resulted in respiratory failure likely caused from drug overdose from multiple psychotropic medications. Patient with no sedation. Patient likely at baseline.  #3 status post fall PT/OT.  #4 diabetes mellitus type 2 Oral hypoglycemic agents on hold. CBG 100-103. Continue sliding scale insulin.  #5 hypertension Stable. Continue current regimen of atenolol and Norvasc.  #6 hyperlipidemia Continue statin.  #7 prophylaxis SCDs for DVT prophylaxis.  Code Status: Full Family Communication: Updated patient. No family at bedside. Disposition Plan: Pending surgery. Likely skilled nursing facility.   Consultants:  Orthopedics: Dr.Xu 08/14/2014  Procedures:  Chest x-ray 08/14/2014    Antibiotics:  None  HPI/Subjective: Patient states pain not fully controlled. Patient asking when he will have surgery.  Objective: Filed Vitals:   08/15/14 0453  BP: 136/95  Pulse: 63  Temp: 97.7 F (36.5 C)  Resp: 16    Intake/Output Summary (Last 24 hours) at 08/15/14 1419 Last data filed at 08/15/14 0218  Gross per 24 hour  Intake    240 ml  Output    925 ml  Net   -685 ml   There were no vitals filed for this visit.  Exam:   General:  NAD  Cardiovascular: RRR  Respiratory: CTAB anterior lung fields.  Abdomen: Soft/NT/ND/+BS  Musculoskeletal: No c/c/e. LLE externally rotated and shortened.  Data Reviewed: Basic Metabolic Panel:  Recent Labs Lab 08/11/14 0230 08/12/14 0220 08/13/14 0806 08/14/14 0701 08/15/14 0605  NA 139 138 141 139 139  K 3.9 3.8 4.1 4.5 3.7  CL 103 101 105 104 105  CO2 29 28 27 26 25   GLUCOSE  131* 116* 105* 128* 99  BUN 9 <5* 6 11 8   CREATININE 0.82 0.71 0.69 0.73 0.62  CALCIUM 8.6 8.4 9.1 9.2 8.8  MG  --  1.3*  --   --   --   PHOS  --  3.6  --   --   --    Liver Function Tests:  Recent Labs Lab 08/08/14 1905 08/13/14 0806 08/14/14 0701  AST 84* 45* 52*  ALT 62* 36 40  ALKPHOS 22* 20* 20*  BILITOT 0.8 0.7 0.6  PROT 7.0 6.2 7.3  ALBUMIN 3.9 3.0* 3.6   No results for input(s): LIPASE, AMYLASE in the last 168 hours. No results for input(s): AMMONIA in the last 168 hours. CBC:  Recent Labs Lab 08/08/14 1905  08/09/14 0305 08/11/14 0230 08/12/14 0220 08/14/14 0701 08/15/14 0605  WBC 7.2  --  7.3 4.8 3.6* 5.2 4.8  NEUTROABS 3.6  --   --   --   --  3.3  --   HGB 13.0  < > 13.3 11.2* 11.9* 12.1* 11.0*  HCT 39.6  < > 39.6 34.4* 36.4* 37.3* 34.0*  MCV 86.3  --  85.7 86.2 86.3 87.4 86.1  PLT 247  --  107* 141* 147* PLATELET CLUMPS NOTED ON SMEAR, UNABLE TO ESTIMATE 207  < > = values in this interval not displayed. Cardiac Enzymes: No results for input(s): CKTOTAL, CKMB, CKMBINDEX, TROPONINI in the last 168 hours. BNP (last 3 results) No results for input(s): BNP in the last 8760 hours.  ProBNP (last 3 results) No results for input(s): PROBNP in the last 8760 hours.  CBG:  Recent Labs Lab 08/11/14 1122 08/14/14 1031 08/14/14 2041 08/15/14 0007 08/15/14 0426  GLUCAP 143* 104* 103* 100* 102*    Recent Results (from the past 240 hour(s))  Culture, respiratory (NON-Expectorated)     Status: None   Collection Time: 08/09/14  6:55 PM  Result Value Ref Range Status   Specimen Description TRACHEAL ASPIRATE  Final   Special Requests Normal  Final   Gram Stain   Final    MODERATE WBC PRESENT,BOTH PMN AND MONONUCLEAR MODERATE SQUAMOUS EPITHELIAL CELLS PRESENT MODERATE GRAM POSITIVE COCCI IN CLUSTERS IN CHAINS IN PAIRS FEW GRAM POSITIVE RODS RARE GRAM NEGATIVE RODS    Culture   Final    FEW STREPTOCOCCUS,BETA HEMOLYTIC NOT GROUP A Performed at [facility]    Report Status 08/12/2014 FINAL  Final  Culture, blood (routine x 2)     Status: None (Preliminary result)   Collection Time: 08/09/14  7:00 PM  Result Value Ref Range Status   Specimen Description BLOOD LEFT HAND  Final   Special Requests BOTTLES DRAWN AEROBIC ONLY 1CC  Final   Culture   Final           BLOOD CULTURE RECEIVED NO GROWTH TO DATE CULTURE WILL BE HELD FOR 5 DAYS BEFORE ISSUING A FINAL NEGATIVE REPORT Note: Culture results may be compromised due to an inadequate volume of blood received in culture bottles. Performed at [facility]    Report Status PENDING  Incomplete  Culture, blood (routine x 2)     Status: None (Preliminary result)   Collection Time: 08/09/14  7:10 PM  Result Value Ref Range Status   Specimen Description BLOOD RIGHT HAND  Final   Special Requests BOTTLES DRAWN AEROBIC ONLY 1CC  Final   Culture   Final           BLOOD CULTURE RECEIVED NO GROWTH TO DATE CULTURE WILL BE HELD FOR 5 DAYS BEFORE ISSUING A FINAL NEGATIVE REPORT Note: Culture results may be compromised due to an inadequate volume of blood received in culture bottles. Performed at [facility]    Report Status PENDING  Incomplete  Urine culture     Status: None   Collection Time: 08/10/14  9:48 AM  Result Value Ref Range Status   Specimen Description URINE, CATHETERIZED  Final   Special Requests NONE  Final   Colony Count NO GROWTH Performed at [facility]   Final   Culture NO GROWTH Performed at [facility]   Final   Report Status 08/11/2014 FINAL  Final  Surgical pcr screen     Status: None   Collection Time: 08/14/14 10:14 PM  Result Value Ref Range Status   MRSA, PCR NEGATIVE NEGATIVE Final   Staphylococcus aureus NEGATIVE NEGATIVE Final    Comment:        The Xpert SA Assay (FDA approved for NASAL specimens in patients over 51 years of age), is one component of a comprehensive surveillance program.  Test performance has been  validated by [facility] for patients greater than or equal to 41 year old. It is not intended to diagnose infection nor to guide or monitor treatment.      Studies: Dg Chest 1 View  08/14/2014   CLINICAL DATA:  Fall.  Shortness of breath.  EXAM: CHEST  1 VIEW  COMPARISON:  08/09/2014.  FINDINGS: Interim extubation and removal of NG tube. Mediastinum hilar structures normal. Cardiomegaly with normal pulmonary vascularity. Low lung volumes  with mild subsegmental atelectasis. No pleural effusion or pneumothorax. Stable cardiomegaly with normal pulmonary vascularity. Old left posterior sixth rib fracture.  IMPRESSION: 1. Interval removal of endotracheal tube and NG tube. 2. Low lung volumes with mild basilar atelectasis. 3. Stable cardiomegaly.   Electronically Signed   By: Maisie Fus  Register   On: 08/14/2014 07:21   Dg Hip Unilat With Pelvis 2-3 Views Left  08/14/2014   CLINICAL DATA:  Fall.  Hip pain  EXAM: LEFT HIP (WITH PELVIS) 2-3 VIEWS  COMPARISON:  None.  FINDINGS: Spiral fracture mid left femur below the prosthesis.  Chronic intertrochanteric fracture on the left is healed. Compression screw and rod in the proximal femur in good position. Left hip joint is normal.  Right hip joint is normal.  No other fracture.  IMPRESSION: Spiral fracture mid left femur.  Left femur is reported separately.   Electronically Signed   By: Marlan Palau M.D.   On: 08/14/2014 07:21   Dg Femur Min 2 Views Left  08/14/2014   CLINICAL DATA:  Fall, severe leg pain and foreshortening.  EXAM: LEFT FEMUR 2 VIEWS  COMPARISON:  Contemporaneously pelvis/hip radiographs  FINDINGS: Spiral fracture through the mid femoral shaft 2.5 cm below the level of the intra medullary rod tip. Medial and posterior displacement of the distal component. Underlying osteopenia.  IMPRESSION: Oblique/spiral fracture of the mid left femoral shaft as above.   Electronically Signed   By: Jearld Lesch M.D.   On: 08/14/2014 07:22    Scheduled  Meds: . amLODipine  10 mg Oral Daily  . atenolol  50 mg Oral Daily  . atorvastatin  20 mg Oral Daily  .  ceFAZolin (ANCEF) IV  2 g Intravenous On Call to OR  . chlorhexidine  60 mL Topical Once  . DULoxetine  60 mg Oral Daily  . insulin aspart  0-15 Units Subcutaneous 6 times per day  . lamoTRIgine  25 mg Oral Daily  . levETIRAcetam  1,000 mg Oral BID  . oxybutynin  5 mg Oral QHS  . QUEtiapine  25 mg Oral QHS   Continuous Infusions: . sodium chloride Stopped (08/14/14 2350)  . sodium chloride 125 mL/hr at 08/14/14 2349    Principal Problem:   Femur fracture, left Active Problems:   Left hemiparesis   Diabetes mellitus type 2, controlled, without complications   Essential hypertension   Acute encephalopathy    Time spent: 40 minutes    Joene Gelder M.D. [facility] Pager [phone]. If 7PM-7AM, please contact night-coverage at [email or web address], password [facility] 08/15/2014, 2:19 PM  LOS: 1 day

## 2014-08-15 NOTE — Anesthesia Preprocedure Evaluation (Addendum)
Anesthesia Evaluation  Patient identified by MRN, date of birth, ID band Patient awake    Reviewed: Allergy & Precautions, NPO status , Patient's Chart, lab work & pertinent test results  Airway Mallampati: II   Neck ROM: full    Dental  (+) Edentulous Upper   Pulmonary COPDCurrent Smoker,  breath sounds clear to auscultation        Cardiovascular hypertension, + CAD Rhythm:regular Rate:Normal     Neuro/Psych Seems somewhat confused as a baseline CVA    GI/Hepatic   Endo/Other  diabetes, Type obesityobese  Renal/GU      Musculoskeletal   Abdominal (+) + obese,   Peds  Hematology   Anesthesia Other Findings   Reproductive/Obstetrics                           Anesthesia Physical Anesthesia Plan  ASA: III  Anesthesia Plan: General   Post-op Pain Management:    Induction: Intravenous  Airway Management Planned: Oral ETT  Additional Equipment:   Intra-op Plan:   Post-operative Plan: Extubation in OR  Informed Consent: I have reviewed the patients History and Physical, chart, labs and discussed the procedure including the risks, benefits and alternatives for the proposed anesthesia with the patient or authorized representative who has indicated his/her understanding and acceptance.     Plan Discussed with: CRNA, Anesthesiologist and Surgeon  Anesthesia Plan Comments:         Anesthesia Quick Evaluation

## 2014-08-15 NOTE — Addendum Note (Signed)
Addendum  created 08/15/14 2238 by Melina SchoolsVernon J Zaydee Aina, CRNA   Modules edited: Anesthesia Attestations

## 2014-08-15 NOTE — Transfer of Care (Signed)
Immediate Anesthesia Transfer of Care Note  Patient: Terrance Cook  Procedure(s) Performed: Procedure(s): Operative Fixation left periprosthetic femur fracture (Left) HARDWARE REMOVAL (Left)  Patient Location: PACU  Anesthesia Type:General  Level of Consciousness: awake, pateint uncooperative and confused  Airway & Oxygen Therapy: Patient Spontanous Breathing and Patient connected to nasal cannula oxygen  Post-op Assessment: Report given to RN, Post -op Vital signs reviewed and stable and Patient moving all extremities X 4  Post vital signs: Reviewed and stable  Last Vitals:  Filed Vitals:   08/15/14 1500  BP: 146/86  Pulse: 66  Temp: 37.3 C  Resp: 18    Complications: No apparent anesthesia complications

## 2014-08-15 NOTE — Discharge Instructions (Signed)
° ° °  1. Change dressings as needed °2. May shower but keep incisions covered and dry °3. Take lovenox to prevent blood clots °4. Take stool softeners as needed °5. Take pain meds as needed ° °

## 2014-08-15 NOTE — Op Note (Signed)
   Date of Surgery: 08/15/2014  INDICATIONS: [name] is a 59 y.o.-year-old male who sustained a left periprosthetic femur fracture;  The family did consent to the procedure after discussion of the risks and benefits.  PREOPERATIVE DIAGNOSIS: Left periprosthetic femur fracture  POSTOPERATIVE DIAGNOSIS: Same.  PROCEDURE:  1. Intramedullary fixation of left femur fracture. 2. Removal of deep implant, cephallomedullary implant  SURGEON: N. Glee ArvinMichael Xu, M.D.  ASSIST: April Chilton SiGreen, RNFA.  ANESTHESIA:  general  IV FLUIDS AND URINE: See anesthesia.  ESTIMATED BLOOD LOSS: 200 mL.  IMPLANTS: Smith and Nephew 11.5 x 40 Intertan  DRAINS: none  COMPLICATIONS: None.  DESCRIPTION OF PROCEDURE: The patient was brought to the operating room and placed supine on the operating table.  The patient had been signed prior to the procedure and this was documented. The patient had the anesthesia placed by the anesthesiologist.  A time-out was performed to confirm that this was the correct patient, site, side and location. The patient had an SCD on the opposite lower extremity. The patient did receive antibiotics prior to the incision and was re-dosed during the procedure as needed at indicated intervals.  The patient had the operative extremity prepped and draped in the standard surgical fashion.    We first began with removal of the deep implant. Under fluoroscopic guidance we were able to find the proximal end of the nail and was able to screw in the extractor device. Once we had this screw then we then took out the distal interlock in the 2 interdigitating lag screws under fluoroscopic guidance. We then backed out the nail with care. Once this was done we then placed a guidewire down the proximal femur. We used a reducer to gain reduction of the femur fracture. With her reduced we then advanced a reducer across the fracture site and into the distal femur. The guidewire was then advanced down into the distal  femur to the level of the physes of scar. With the fracture reduced we measured the nail to be around a 40 cm nail. We then sequentially reamed to a 13 mm reamer. We were unable to ream anymore because of the lack of a larger diameter implant. We then inserted a 11.5 x 40 cm nail over the guidewire to the appropriate depth. The appropriate version was found under fluoroscopy. 2 parallel drill holes were advanced up the femoral neck and into the head. The depth was confirmed on fluoroscopy and measured. We then placed the superior lag screw first as center center as possible. We then placed the interdigitating inferior worm screw.  We then turned our attention to the distal interlocking screws. We placed those screws and under the perfect circle technique. Final x-rays were taken. The fracture maintained reduction. All sponge counts were correct. The wounds were thoroughly irrigated and closed in layer fashion using 0 Vicryl for the fascia, 2-0 Vicryl for the subcutaneous fat, staples for the skin. Sterile dressings were applied. Patient was x-rayed and transferred to the PACU in stable condition.  Postoperative plan: He will be nonweightbearing to the left lower extremity. He will be on Lovenox for DVT prophylaxis.  Mayra ReelN. Michael Xu, MD [facility] [phone] 7:39 PM

## 2014-08-15 NOTE — H&P (Signed)
H&P update  The surgical history has been reviewed and remains accurate without interval change.  The patient was re-examined and patient's physiologic condition has not changed significantly in the last 30 days. The condition still exists that makes this procedure necessary. The treatment plan remains the same, without new options for care.  No new pharmacological allergies or types of therapy has been initiated that would change the plan or the appropriateness of the plan.  The patient and/or family understand the potential benefits and risks.  N. Michael Xu, MD 08/15/2014 7:37 AM   

## 2014-08-15 NOTE — Anesthesia Postprocedure Evaluation (Signed)
  Anesthesia Post-op Note  Patient: Terrance Cook  Procedure(s) Performed: Procedure(s): Operative Fixation left periprosthetic femur fracture (Left) HARDWARE REMOVAL (Left)  Patient Location: PACU  Anesthesia Type:General  Level of Consciousness: sedated, patient cooperative and responds to stimulation  Airway and Oxygen Therapy: Patient Spontanous Breathing and Patient connected to nasal cannula oxygen  Post-op Pain: none  Post-op Assessment: Post-op Vital signs reviewed, Patient's Cardiovascular Status Stable, Respiratory Function Stable, Patent Airway, No signs of Nausea or vomiting and Adequate PO intake  Post-op Vital Signs: Reviewed and stable  Last Vitals:  Filed Vitals:   08/15/14 2100  BP:   Pulse: 89  Temp: 36.7 C  Resp: 18    Complications: No apparent anesthesia complications

## 2014-08-16 DIAGNOSIS — D62 Acute posthemorrhagic anemia: Secondary | ICD-10-CM | POA: Diagnosis not present

## 2014-08-16 LAB — GLUCOSE, CAPILLARY
GLUCOSE-CAPILLARY: 142 mg/dL — AB (ref 70–99)
GLUCOSE-CAPILLARY: 162 mg/dL — AB (ref 70–99)
GLUCOSE-CAPILLARY: 138 mg/dL — AB (ref 70–99)
Glucose-Capillary: 128 mg/dL — ABNORMAL HIGH (ref 70–99)

## 2014-08-16 LAB — BASIC METABOLIC PANEL
ANION GAP: 12 (ref 5–15)
BUN: 12 mg/dL (ref 6–23)
CO2: 24 mmol/L (ref 19–32)
Calcium: 8.5 mg/dL (ref 8.4–10.5)
Chloride: 105 mmol/L (ref 96–112)
Creatinine, Ser: 0.81 mg/dL (ref 0.50–1.35)
GFR calc non Af Amer: >90 mL/min (ref >90)
GLUCOSE: 123 mg/dL — AB (ref 70–99)
POTASSIUM: 4.3 mmol/L (ref 3.5–5.1)
SODIUM: 141 mmol/L (ref 135–145)

## 2014-08-16 LAB — CBC
HEMATOCRIT: 30.2 % — AB (ref 39.0–52.0)
Hemoglobin: 10 g/dL — ABNORMAL LOW (ref 13.0–17.0)
MCH: 28.2 pg (ref 26.0–34.0)
MCHC: 33.1 g/dL (ref 30.0–36.0)
MCV: 85.3 fL (ref 78.0–100.0)
Platelets: 225 10*3/uL (ref 150–400)
RBC: 3.54 MIL/uL — ABNORMAL LOW (ref 4.22–5.81)
RDW: 13.1 % (ref 11.5–15.5)
WBC: 6.1 10*3/uL (ref 4.0–10.5)

## 2014-08-16 LAB — CULTURE, BLOOD (ROUTINE X 2)
CULTURE: NO GROWTH
Culture: NO GROWTH

## 2014-08-16 NOTE — Evaluation (Signed)
Physical Therapy Evaluation Patient Details Name: Terrance Cook MRN: [id number] DOB: 1955-10-29 Today's Date: 08/16/2014   History of Present Illness  Patient is a 59 y/o male s/p recent admission from 4/13-4/18 due to AMS, VDRF and L weakness, then went to SNF where he evidently got up and fell now with left periprosthetic femur fracture s/p IM fixation and removal of deep cephalomedullary implant.  Clinical Impression  Patient presents with decreased mobility due to deficits listed in PT problem list.  He will benefit from skilled PT in the acute setting to decrease burden of care and maximize safety with mobility.  Recommend return to SNF level rehab at d/c.    Follow Up Recommendations SNF;Supervision/Assistance - 24 hour    Equipment Recommendations  None recommended by PT    Recommendations for Other Services       Precautions / Restrictions Precautions Precautions: Fall Restrictions LLE Weight Bearing: Non weight bearing      Mobility  Bed Mobility Overal bed mobility: Needs Assistance       Supine to sit: Mod assist     General bed mobility comments: assist for left LE to edge of bed and to lift trunk upright  Transfers Overall transfer level: Needs assistance Equipment used: Rolling walker (2 wheeled) Transfers: Sit to/from Stand Sit to Stand: Mod assist Stand pivot transfers: Mod assist       General transfer comment: max cues for NWB on left; still put foot down (TDWB) despite cues and demo to pivot with walker to chair  Ambulation/Gait                Stairs            Wheelchair Mobility    Modified Rankin (Stroke Patients Only)       Balance Overall balance assessment: Needs assistance         Standing balance support: Bilateral upper extremity supported Standing balance-Leahy Scale: Poor Standing balance comment: assist for balance/safety with walker with NWB left LE                             Pertinent  Vitals/Pain Pain Assessment: Faces Faces Pain Scale: Hurts even more Pain Location: left leg Pain Intervention(s): Premedicated before session;Monitored during session;Repositioned;Ice applied    Home Living Family/patient expects to be discharged to:: Skilled nursing facility                      Prior Function Level of Independence: Needs assistance   Gait / Transfers Assistance Needed: +2 for transfers from previous admission; prior to that was in ALF at wheelchair level           Hand Dominance        Extremity/Trunk Assessment   Upper Extremity Assessment: Overall WFL for tasks assessed           Lower Extremity Assessment: LLE deficits/detail   LLE Deficits / Details: AAROM ankle limited with positive clonus non fatiguing, AAROM knee flexion limited by pain, about 30-40 degrees     Communication      Cognition Arousal/Alertness: Awake/alert Behavior During Therapy: WFL for tasks assessed/performed Overall Cognitive Status: Impaired/Different from baseline Area of Impairment: Orientation Orientation Level: Disoriented to;Place;Time;Situation     Following Commands: Follows one step commands consistently Safety/Judgement: Decreased awareness of safety;Decreased awareness of deficits   Problem Solving: Requires verbal cues;Requires tactile cues General Comments: had pulled foley catheter out this morning  General Comments      Exercises Total Joint Exercises Ankle Circles/Pumps: AAROM;Left;5 reps;Supine Heel Slides: AAROM;Left;Supine;10 reps      Assessment/Plan    PT Assessment Patient needs continued PT services  PT Diagnosis Difficulty walking;Acute pain;Generalized weakness   PT Problem List Decreased strength;Decreased mobility;Decreased safety awareness;Decreased knowledge of precautions;Decreased balance;Pain;Decreased activity tolerance;Decreased range of motion  PT Treatment Interventions DME instruction;Therapeutic  exercise;Wheelchair mobility training;Functional mobility training;Therapeutic activities;Patient/family education   PT Goals (Current goals can be found in the Care Plan section) Acute Rehab PT Goals PT Goal Formulation: Patient unable to participate in goal setting Time For Goal Achievement: 08/30/14 Potential to Achieve Goals: Good    Frequency Min 3X/week   Barriers to discharge        Co-evaluation               End of Session Equipment Utilized During Treatment: Gait belt Activity Tolerance: Patient tolerated treatment well Patient left: in chair;with call bell/phone within reach;with chair alarm set Nurse Communication: Mobility status         Time: [id number] PT Time Calculation (min) (ACUTE ONLY): 36 min   Charges:   PT Evaluation $Initial PT Evaluation Tier I: 1 Procedure PT Treatments $Therapeutic Activity: 8-22 mins   PT G Codes:        Inetha Maret,CYNDI 08/16/2014, 1:08 PM  Sheran Lawlessyndi Biagio Snelson, PT [phone] 08/16/2014

## 2014-08-16 NOTE — Clinical Social Work Note (Signed)
[facility].  CSW now following/assisting with disposition.  Full assessment to follow.  Vickii PennaGina Tametra Ahart, [name] [phone]  Psychiatric & Orthopedics (5N 1-8) Clinical Social Worker

## 2014-08-16 NOTE — Progress Notes (Signed)
[facility] PROGRESS NOTE  Terrance HalimJames Heard RUE:[id number] DOB: [birth year] DOA: 08/14/2014 PCP: No PCP Per Patient  Assessment/Plan: #1 left periprosthetic femur fracture Secondary to mechanical fall. Patient s/p IM nail and removal of deep implant. PT/OT. NWB LLE. Orthopedics following.   #2 acute encephalopathy Patient recently treated for significant mental status changes that resulted in respiratory failure likely caused from drug overdose from multiple psychotropic medications. Patient with no sedation. Patient likely at baseline.  #3 status post fall PT/OT.  #4 diabetes mellitus type 2 Oral hypoglycemic agents on hold. CBG 138-162. Continue sliding scale insulin.  #5 hypertension Stable. Continue atenolol and Norvasc.  #6 hyperlipidemia Continue statin.  #7 acute blood loss anemia Secondary to surgery. Hemoglobin currently at 10. Follow H&H.  #8 prophylaxis SCDs/Lovenox for DVT prophylaxis.  Code Status: Full Family Communication: Updated patient. No family at bedside. Disposition Plan: Pending PT eval. Likely skilled nursing facility.   Consultants:  Orthopedics: Dr.Xu 08/14/2014  Procedures:  Chest x-ray 08/14/2014  IM fixation of left femur fracture/removal of deep implant, cephallomedullary implant 08/15/2014 per Dr.Xu  Antibiotics:  None  HPI/Subjective: Patient states pain controlled. Patient denies SOB.  Objective: Filed Vitals:   08/16/14 1300  BP: 112/69  Pulse: 82  Temp: 97.5 F (36.4 C)  Resp: 18    Intake/Output Summary (Last 24 hours) at 08/16/14 1610 Last data filed at 08/16/14 1300  Gross per 24 hour  Intake   1480 ml  Output   1650 ml  Net   -170 ml   There were no vitals filed for this visit.  Exam:   General:  NAD  Cardiovascular: RRR  Respiratory: CTAB anterior lung fields.  Abdomen: Soft/NT/ND/+BS  Musculoskeletal: No c/c/e.  Data Reviewed: Basic Metabolic Panel:  Recent Labs Lab 08/12/14 0220  08/13/14 0806 08/14/14 0701 08/15/14 0605 08/16/14 0802  NA 138 141 139 139 141  K 3.8 4.1 4.5 3.7 4.3  CL 101 105 104 105 105  CO2 28 27 26 25 24   GLUCOSE 116* 105* 128* 99 123*  BUN <5* 6 11 8 12   CREATININE 0.71 0.69 0.73 0.62 0.81  CALCIUM 8.4 9.1 9.2 8.8 8.5  MG 1.3*  --   --   --   --   PHOS 3.6  --   --   --   --    Liver Function Tests:  Recent Labs Lab 08/13/14 0806 08/14/14 0701  AST 45* 52*  ALT 36 40  ALKPHOS 20* 20*  BILITOT 0.7 0.6  PROT 6.2 7.3  ALBUMIN 3.0* 3.6   No results for input(s): LIPASE, AMYLASE in the last 168 hours. No results for input(s): AMMONIA in the last 168 hours. CBC:  Recent Labs Lab 08/11/14 0230 08/12/14 0220 08/14/14 0701 08/15/14 0605 08/16/14 0802  WBC 4.8 3.6* 5.2 4.8 6.1  NEUTROABS  --   --  3.3  --   --   HGB 11.2* 11.9* 12.1* 11.0* 10.0*  HCT 34.4* 36.4* 37.3* 34.0* 30.2*  MCV 86.2 86.3 87.4 86.1 85.3  PLT 141* 147* PLATELET CLUMPS NOTED ON SMEAR, UNABLE TO ESTIMATE 207 225   Cardiac Enzymes: No results for input(s): CKTOTAL, CKMB, CKMBINDEX, TROPONINI in the last 168 hours. BNP (last 3 results) No results for input(s): BNP in the last 8760 hours.  ProBNP (last 3 results) No results for input(s): PROBNP in the last 8760 hours.  CBG:  Recent Labs Lab 08/15/14 2004 08/15/14 2120 08/16/14 0417 08/16/14 0852 08/16/14 1601  GLUCAP 107* 133* 142* 162*  138*    Recent Results (from the past 240 hour(s))  Culture, respiratory (NON-Expectorated)     Status: None   Collection Time: 08/09/14  6:55 PM  Result Value Ref Range Status   Specimen Description TRACHEAL ASPIRATE  Final   Special Requests Normal  Final   Gram Stain   Final    MODERATE WBC PRESENT,BOTH PMN AND MONONUCLEAR MODERATE SQUAMOUS EPITHELIAL CELLS PRESENT MODERATE GRAM POSITIVE COCCI IN CLUSTERS IN CHAINS IN PAIRS FEW GRAM POSITIVE RODS RARE GRAM NEGATIVE RODS    Culture   Final    FEW STREPTOCOCCUS,BETA HEMOLYTIC NOT GROUP A Performed at  [facility]    Report Status 08/12/2014 FINAL  Final  Culture, blood (routine x 2)     Status: None   Collection Time: 08/09/14  7:00 PM  Result Value Ref Range Status   Specimen Description BLOOD LEFT HAND  Final   Special Requests BOTTLES DRAWN AEROBIC ONLY 1CC  Final   Culture   Final    NO GROWTH 5 DAYS Note: Culture results may be compromised due to an inadequate volume of blood received in culture bottles. Performed at [facility]    Report Status 08/16/2014 FINAL  Final  Culture, blood (routine x 2)     Status: None   Collection Time: 08/09/14  7:10 PM  Result Value Ref Range Status   Specimen Description BLOOD RIGHT HAND  Final   Special Requests BOTTLES DRAWN AEROBIC ONLY 1CC  Final   Culture   Final    NO GROWTH 5 DAYS Note: Culture results may be compromised due to an inadequate volume of blood received in culture bottles. Performed at [facility]    Report Status 08/16/2014 FINAL  Final  Urine culture     Status: None   Collection Time: 08/10/14  9:48 AM  Result Value Ref Range Status   Specimen Description URINE, CATHETERIZED  Final   Special Requests NONE  Final   Colony Count NO GROWTH Performed at [facility]   Final   Culture NO GROWTH Performed at [facility]   Final   Report Status 08/11/2014 FINAL  Final  Surgical pcr screen     Status: None   Collection Time: 08/14/14 10:14 PM  Result Value Ref Range Status   MRSA, PCR NEGATIVE NEGATIVE Final   Staphylococcus aureus NEGATIVE NEGATIVE Final    Comment:        The Xpert SA Assay (FDA approved for NASAL specimens in patients over 37 years of age), is one component of a comprehensive surveillance program.  Test performance has been validated by [facility] for patients greater than or equal to 23 year old. It is not intended to diagnose infection nor to guide or monitor treatment.      Studies: Dg C-arm 61-120 Min  08/15/2014   CLINICAL DATA:   59 year old male with a history of femur fracture repair  EXAM: DG C-ARM 61-120 MIN; LEFT FEMUR 2 VIEWS  COMPARISON:  Plain film 08/14/2014  FLUOROSCOPY TIME:  Radiation Exposure Index (as provided by the fluoroscopic device): Op note  If the device does not provide the exposure index:  Fluoroscopy Time:  1 minutes 25 seconds  Number of Acquired Images:  6  FINDINGS: Intraoperative fluoroscopic spot images of ORIF.  Antegrade intra medullary nail with parallel screw fixation at the femoral neck and parallel screw fixation at the distal left femur via a lateral approach. Anatomic alignment maintained across the fracture  site, with no complicating features identified.  IMPRESSION: Fluoroscopic images of ORIF for fixation of left femoral fracture with antegrade intra medullary rod and proximal and distal fixation as above. Anatomic alignment is maintained with no complicating features.  Please refer to the dictated operative report for full details of intraoperative findings and procedure.  Signed,  Yvone Neu. Loreta Ave, DO  [facility]  [facility]   Electronically Signed   By: Gilmer Mor D.O.   On: 08/15/2014 20:05   Dg Femur Min 2 Views Left  08/15/2014   CLINICAL DATA:  59 year old male with a history of femur fracture repair  EXAM: DG C-ARM 61-120 MIN; LEFT FEMUR 2 VIEWS  COMPARISON:  Plain film 08/14/2014  FLUOROSCOPY TIME:  Radiation Exposure Index (as provided by the fluoroscopic device): Op note  If the device does not provide the exposure index:  Fluoroscopy Time:  1 minutes 25 seconds  Number of Acquired Images:  6  FINDINGS: Intraoperative fluoroscopic spot images of ORIF.  Antegrade intra medullary nail with parallel screw fixation at the femoral neck and parallel screw fixation at the distal left femur via a lateral approach. Anatomic alignment maintained across the fracture site, with no complicating features identified.  IMPRESSION: Fluoroscopic images of  ORIF for fixation of left femoral fracture with antegrade intra medullary rod and proximal and distal fixation as above. Anatomic alignment is maintained with no complicating features.  Please refer to the dictated operative report for full details of intraoperative findings and procedure.  Signed,  Yvone Neu. Loreta Ave, DO  [facility]  [facility]   Electronically Signed   By: Gilmer Mor D.O.   On: 08/15/2014 20:05    Scheduled Meds: . amLODipine  10 mg Oral Daily  . atenolol  50 mg Oral Daily  . atorvastatin  20 mg Oral Daily  . DULoxetine  60 mg Oral Daily  . enoxaparin (LOVENOX) injection  40 mg Subcutaneous Q24H  . insulin aspart  0-15 Units Subcutaneous 6 times per day  . lamoTRIgine  25 mg Oral Daily  . levETIRAcetam  1,000 mg Oral BID  . oxybutynin  5 mg Oral QHS  . QUEtiapine  25 mg Oral QHS   Continuous Infusions: . sodium chloride 125 mL/hr at 08/16/14 0414  . lactated ringers 10 mL/hr at 08/15/14 1536    Principal Problem:   Femur fracture, left Active Problems:   Left hemiparesis   Diabetes mellitus type 2, controlled, without complications   Essential hypertension   Acute encephalopathy   Femur fracture   Postoperative anemia due to acute blood loss    Time spent: 40 minutes    Adrianna Dudas M.D. [facility] Pager [phone]. If 7PM-7AM, please contact night-coverage at [email or web address], password [facility] 08/16/2014, 4:10 PM  LOS: 2 days

## 2014-08-16 NOTE — Progress Notes (Signed)
   Subjective:  Patient reports pain as mild.  No events.  Objective:   VITALS:   Filed Vitals:   08/16/14 0000 08/16/14 0203 08/16/14 0400 08/16/14 0419  BP:  144/82  131/83  Pulse:  94  82  Temp:  99.2 F (37.3 C)  98.8 F (37.1 C)  TempSrc:      Resp: 18 18 18 18   SpO2: 92% 92% 94% 94%    Pleasantly confused Dressings c/d/i Foot wwp NVI   Lab Results  Component Value Date   WBC 4.8 08/15/2014   HGB 11.0* 08/15/2014   HCT 34.0* 08/15/2014   MCV 86.1 08/15/2014   PLT 207 08/15/2014     Assessment/Plan:  1 Day Post-Op   - Expected postop acute blood loss anemia - will monitor for symptoms - Up with PT/OT - DVT ppx - SCDs, ambulation, lovenox - NWB left lower extremity - will need SNF  Cheral AlmasXu, Naiping Michael 08/16/2014, 7:45 AM [phone]

## 2014-08-16 NOTE — Progress Notes (Signed)
Occupational Therapy Evaluation Patient Details Name: Terrance Cook MRN: [id number] DOB: [birth year] Today's Date: 08/16/2014    History of Present Illness Patient is a 59 y/o male s/p recent admission from 4/13-4/18 due to AMS, VDRF and L weakness, then went to SNF where he evidently got up and fell now with left periprosthetic femur fracture s/p IM fixation and removal of deep cephalomedullary implant.   Clinical Impression   PTA, pt at SNF for rehab. Apparently lived in ALF prior to hospital admission leading to SNF placement. Pt apparently confused today.   Unsure of baseline.Unable to maintain NWB status. Apparent psych history per chart. Pt will need to d/C to SNF for rehab . Will follow acutely to maximize independence with ADL and facilitate D/C to next venue of care.     Follow Up Recommendations  SNF;Supervision/Assistance - 24 hour    Equipment Recommendations  Other (comment) (TBA @ SNF)    Recommendations for Other Services       Precautions / Restrictions Precautions Precautions: Fall;Other (comment) (confused) Other Brace/Splint: Pt has a L knee brace that he wears for support all the time (knee brace not in room) Restrictions Weight Bearing Restrictions: Yes LLE Weight Bearing: Non weight bearing      Mobility Bed Mobility Overal bed mobility: Needs Assistance Bed Mobility: Sit to Supine     Supine to sit: Mod assist     General bed mobility comments: assist to lift BLE onto bed  Transfers Overall transfer level: Needs assistance Equipment used: Rolling walker (2 wheeled) Transfers: Sit to/from [employer] Sit to Stand: Max assist;+2 physical assistance Stand pivot transfers: +2 physical assistance;Total assist (Pt required total A +2 to get back to bed using pivot due to inability to maintain NWB status and trying to sit without a bed behind him)       General transfer comment: Pt required Max A +2 to get up from lower surface. Unable  to maintain NWB status.     Balance Overall balance assessment: Needs assistance   Sitting balance-Leahy Scale: Fair     Standing balance support: Bilateral upper extremity supported Standing balance-Leahy Scale: Poor Standing balance comment: assist for balance/safety with walker with NWB left LE                            ADL Overall ADL's : Needs assistance/impaired     Grooming: Supervision/safety;Sitting Grooming Details (indicate cue type and reason): S for safety Upper Body Bathing: Supervision/ safety;Sitting   Lower Body Bathing: Maximal assistance   Upper Body Dressing : Supervision/safety   Lower Body Dressing: Maximal assistance   Toilet Transfer: +2 for physical assistance;Maximal assistance;Stand-pivot Toilet Transfer Details (indicate cue type and reason): unable to maintain NWB status         Functional mobility during ADLs: Maximal assistance;+2 for physical assistance General ADL Comments: Pt pulled foley out this am. Penis oozing blood. nsg notified                     Pertinent Vitals/Pain Pain Assessment: 0-10 Pain Score: 8  Faces Pain Scale: Hurts even more Pain Location: LLE Pain Descriptors / Indicators: Aching;Guarding Pain Intervention(s): Monitored during session;Repositioned;Ice applied     Hand Dominance Right   Extremity/Trunk Assessment Upper Extremity Assessment Upper Extremity Assessment: Generalized weakness   Lower Extremity Assessment Lower Extremity Assessment: Defer to PT evaluation    Cervical / Trunk Assessment Cervical / Trunk Assessment:  Normal   Communication Communication Communication: No difficulties   Cognition Arousal/Alertness: Awake/alert Behavior During Therapy: Impulsive Overall Cognitive Status: Impaired/Different from baseline Area of Impairment: Orientation;Attention;Following commands;Safety/judgement;Awareness;Problem solving Orientation Level: Disoriented  to;Place;Time Current Attention Level: Sustained Memory: Decreased recall of precautions;Decreased short-term memory Following Commands: Follows one step commands with increased time Safety/Judgement: Decreased awareness of safety;Decreased awareness of deficits Awareness: Intellectual Problem Solving: Difficulty sequencing;Requires verbal cues;Requires tactile cues General Comments: had pulled foley catheter out this morning   General Comments   Bleeding from penis - nsg awware                 Home Living Family/patient expects to be discharged to:: Skilled nursing facility                                        Prior Functioning/Environment Level of Independence: Needs assistance  Gait / Transfers Assistance Needed: +2 for transfers from previous admission; prior to that was in ALF at wheelchair level ADL's / Homemaking Assistance Needed: unsure of PLOF   Comments: Pt states he was mod I @ w/c level. unsure of accuracy    OT Diagnosis: Generalized weakness;Cognitive deficits;Acute pain   OT Problem List: Decreased strength;Decreased range of motion;Decreased activity tolerance;Impaired balance (sitting and/or standing);Decreased cognition;Decreased safety awareness;Decreased knowledge of use of DME or AE;Decreased knowledge of precautions;Obesity;Pain   OT Treatment/Interventions: Self-care/ADL training;Therapeutic exercise;DME and/or AE instruction;Therapeutic activities;Cognitive remediation/compensation;Patient/family education;Balance training    OT Goals(Current goals can be found in the care plan section) Acute Rehab OT Goals Patient Stated Goal: unable to state OT Goal Formulation: Patient unable to participate in goal setting Time For Goal Achievement: 08/30/14 Potential to Achieve Goals: Fair ADL Goals Pt Will Perform Lower Body Bathing: with min assist;sitting/lateral leans;bed level Pt Will Transfer to Toilet: with mod assist;bedside  commode;squat pivot transfer Pt Will Perform Toileting - Clothing Manipulation and hygiene: sitting/lateral leans;with min assist Additional ADL Goal #1: Bed mobility with min A in preparation for ADL  OT Frequency: Min 2X/week   Barriers to D/C:            Co-evaluation              End of Session Equipment Utilized During Treatment: Gait belt;Rolling walker Nurse Communication: Mobility status;Need for lift equipment;Precautions;Weight bearing status (safest option is to use maximove)  Activity Tolerance: Patient tolerated treatment well Patient left: in bed;with call bell/phone within reach;with bed alarm set   Time: 1245-1305 OT Time Calculation (min): 20 min Charges:  OT General Charges $OT Visit: 1 Procedure OT Evaluation $Initial OT Evaluation Tier I: 1 Procedure G-Codes:    Maryon Kemnitz,HILLARY 08/16/2014, 2:05 PM   Luisa DagoHilary Cayson Kalb, OTR/L  [phone] 08/16/2014

## 2014-08-16 NOTE — Progress Notes (Signed)
0630 NT called me in room b/c when she removed foley there was bleeding from penis. It was bright red, there was a clot, and maybe bled about 20cc's. Some urine mixed in it. Pt had been very confused and had been pulling on it many times, so I believe it was irritated by all the movement.

## 2014-08-16 NOTE — Progress Notes (Signed)
Orthopedic Tech Progress Note Patient Details:  Terrance HalimJames Cook [birth year] [id number]  Patient ID: Terrance Cook, male   DOB: [birth year], 59 y.o.   MRN: [id number] Pt unable to use trapeze bar patient helper; RN notified  Terrance DomCrawford, Terrance Cook 08/16/2014, 7:28 AM

## 2014-08-17 LAB — BASIC METABOLIC PANEL
ANION GAP: 9 (ref 5–15)
BUN: 13 mg/dL (ref 6–23)
CHLORIDE: 106 mmol/L (ref 96–112)
CO2: 26 mmol/L (ref 19–32)
CREATININE: 0.7 mg/dL (ref 0.50–1.35)
Calcium: 8.5 mg/dL (ref 8.4–10.5)
GFR calc non Af Amer: >90 mL/min (ref >90)
GLUCOSE: 95 mg/dL (ref 70–99)
POTASSIUM: 3.9 mmol/L (ref 3.5–5.1)
SODIUM: 141 mmol/L (ref 135–145)

## 2014-08-17 LAB — GLUCOSE, CAPILLARY
GLUCOSE-CAPILLARY: 162 mg/dL — AB (ref 70–99)
Glucose-Capillary: 123 mg/dL — ABNORMAL HIGH (ref 70–99)
Glucose-Capillary: 130 mg/dL — ABNORMAL HIGH (ref 70–99)

## 2014-08-17 LAB — CBC
HCT: 27.9 % — ABNORMAL LOW (ref 39.0–52.0)
Hemoglobin: 9.1 g/dL — ABNORMAL LOW (ref 13.0–17.0)
MCH: 28.1 pg (ref 26.0–34.0)
MCHC: 32.6 g/dL (ref 30.0–36.0)
MCV: 86.1 fL (ref 78.0–100.0)
Platelets: 238 10*3/uL (ref 150–400)
RBC: 3.24 MIL/uL — AB (ref 4.22–5.81)
RDW: 13.5 % (ref 11.5–15.5)
WBC: 6.2 10*3/uL (ref 4.0–10.5)

## 2014-08-17 MED ORDER — BISACODYL 5 MG PO TBEC: 5.0000 mg | DELAYED_RELEASE_TABLET | Freq: Every day | ORAL | Status: AC | PRN

## 2014-08-17 MED ORDER — DIAZEPAM 5 MG PO TABS: 5.0000 mg | ORAL_TABLET | Freq: Three times a day (TID) | ORAL | Status: DC | PRN

## 2014-08-17 NOTE — Clinical Social Work Note (Signed)
Clinical Social Work Assessment  Patient Details  Name: Terrance Cook MRN: [id number] Date of Birth: [birth year]  Date of referral:  08/16/14               Reason for consult:  Facility Placement ([facility])                Permission sought to share information with:  Other (Patient is not alert and oriented.  Assessment completed with brother, Terrance NeedleMichael) Permission granted to share information::  Yes, Verbal Permission Granted  Name::        Agency::   Medical sales representative([facility])  Relationship::     Contact Information:     Housing/Transportation Living arrangements for the past 2 months:  Assisted Living Facility, Skilled Nursing Facility Source of Information:  Other (Comment Required) (Patient not alert and oriented (only to self).  Assessment completed with brother, Terrance NeedleMichael) Patient Interpreter Needed:  None Criminal Activity/Legal Involvement Pertinent to Current Situation/Hospitalization:   (not assessed) Significant Relationships:  Siblings (Brother Terrance NeedleMichael) Lives with:  Other (Comment) (From [facility]) Do you feel safe going back to the place where you live?  Yes Need for family participation in patient care:  Yes (Comment) (Patient alert and oriented to self only)  Care giving concerns:  No concerns expressed at this time.     Social Worker assessment / plan:  Brother Terrance NeedleMichael was agreeable for patient to return to [facility] at time of discharge.  Employment status:  Disabled (Comment on whether or not currently receiving Disability) Insurance information:  [place] Recommendations:  24 Hour Supervision, Skilled Nursing Facility Information / Referral to community resources:  Skilled Nursing Facility  Patient/Family's Response to care:  Brother is Adult nurseappreciative of CSW support and assistance.  Patient/Family's Understanding of and Emotional Response to Diagnosis, Current Treatment, and Prognosis:  Patient is  only oriented to self at this time.  Brother, Terrance NeedleMichael is realistic regarding patient's dx, px and STR/SNF needs at time of discharge.  Emotional Assessment Appearance:  Appears stated age Attitude/Demeanor/Rapport:  Uncooperative Affect (typically observed):  Frustrated, Anxious Orientation:  Oriented to Self Alcohol / Substance use:  Not Applicable Psych involvement (Current and /or in the community):  No (Comment)  Discharge Needs  Concerns to be addressed:    Readmission within the last 30 days:  Yes ([facility] and [facility]) Current discharge risk:  Cognitively Impaired Barriers to Discharge:  No Barriers Identified   Rondel Batonngle, Tavoris Brisk C, LCSW 08/17/2014, 10:20 AM

## 2014-08-17 NOTE — Discharge Summary (Signed)
Physician Discharge Summary  Terrance Cook ZOX:[id number] DOB: [birth year] DOA: 08/14/2014  PCP: No PCP Per Patient  Admit date: 08/14/2014 Discharge date: 08/17/2014  Time spent: 65 minutes  Recommendations for Outpatient Follow-up:  1. Follow up with Dr Roda Shutters in 2 weeks. 2. Follow up with MD at SNF. Patient will need BMET and CBC in 1 week.  Discharge Diagnoses:  Principal Problem:   Femur fracture, left Active Problems:   Left hemiparesis   Diabetes mellitus type 2, controlled, without complications   Essential hypertension   Acute encephalopathy   Femur fracture   Postoperative anemia due to acute blood loss   Discharge Condition: Stable  Diet recommendation: Carb modified diet  There were no vitals filed for this visit.  History of present illness:  Terrance Cook is a 59 y.o. male with a past medical history of hypertension, diabetes mellitus, he was discharged from the medicine service on 08/13/2014, admitted on 08/09/2014 when he presented with acute encephalopathy and acute hypoxemic respiratory failure likely medication related, requiring intubation on admission. Patient had been on multiple psychotropic medications. Head CT was negative on admission. This was further worked up with an MRI of brain which did not show evidence of acute CVA. He was discharged in stable condition to [facility]. During my evaluation patient is sedated and cannot provide history. History was obtained from emergency room staff. Overnight he was found on the floor of his room. Patient stated he was attempting to ambulate then felt a "pop" in his leg, and causing him to fall to the floor. He couldnot bear weight after his fall for which EMS was called. X-ray showed oblique/spiral fracture of the mid left femoral shaft. Dr Roda Shutters of orthopedic surgery was consulted who requested patient is transferred to [facility] to receive further treatment. Dr Vanessa Barbara spoke with Dr Susie Cassette of the medicine  service who accepted patient in transfer to that facility.   Hospital Course:  #1 left periprosthetic femur fracture Secondary to mechanical fall. Patient was admitted and seen in consultation with orthopedics. Patient s/p IM nail and removal of deep implant. Patient was seen by physical therapy and recommended skilled nursing facility. Patient be discharged to a skilled nursing facility in stable condition and will follow-up with orthopedics as outpatient in 2 weeks.   #2 acute encephalopathy Patient recently treated for significant mental status changes that resulted in respiratory failure likely caused from drug overdose from multiple psychotropic medications. Patient with no sedation. Patient likely at baseline.  #3 status post fall PT/OT.  #4 diabetes mellitus type 2 Oral hypoglycemic agents were held during the hospitalization. Patient was maintained on a sliding scale insulin. Patient be discharged back on his home regimen of metformin.  #5 hypertension Stable. Continued on atenolol and Norvasc.  #6 hyperlipidemia Continued on home regimen of statin.  #7 acute blood loss anemia Secondary to surgery. Hemoglobin currently at 9.1.   Procedures:  Chest x-ray 08/14/2014  IM fixation of left femur fracture/removal of deep implant, cephallomedullary implant 08/15/2014 per Dr.Xu    Consultations:  Orthopedics: Dr.Xu 08/14/2014  Discharge Exam: Filed Vitals:   08/17/14 0510  BP: 102/67  Pulse: 65  Temp: 99 F (37.2 C)  Resp: 18    General: NAD Cardiovascular: RRR Respiratory: CTAB  Discharge Instructions   Discharge Instructions    Diet Carb Modified    Complete by:  As directed      Discharge instructions    Complete by:  As directed   Follow  up with Dr Roda Shutters, orthopedics in 2 weeks.     Non weight bearing    Complete by:  As directed            Current Discharge Medication List    START taking these medications   Details  bisacodyl (DULCOLAX) 5 MG EC tablet Take 1 tablet (5 mg total) by mouth daily as needed for moderate constipation. Qty: 30 tablet, Refills: 0    enoxaparin (LOVENOX) 40 MG/0.4ML injection Inject 0.4 mLs (40 mg total) into the skin daily. Qty: 14 Syringe, Refills: 0    oxyCODONE (OXY IR/ROXICODONE) 5 MG immediate release tablet Take 1-3 tablets (5-15 mg total) by mouth every 4 (four) hours as needed. Qty: 90 tablet, Refills: 0      CONTINUE these medications which have CHANGED   Details  diazepam (VALIUM) 5 MG tablet Take 1 tablet (5 mg total) by mouth every 8 (eight) hours as needed for anxiety. HOLD FOR LETHARGY OR SEDATION Qty: 20 tablet, Refills: 0      CONTINUE these medications which have NOT CHANGED   Details  amLODipine (NORVASC) 5 MG tablet Take 10 mg by mouth daily.    aspirin 81 MG chewable tablet Chew 81 mg by mouth daily.    atenolol (TENORMIN) 50 MG tablet Take 1 tablet (50 mg total) by mouth daily.    atorvastatin (LIPITOR) 20 MG tablet Take 20 mg by mouth daily.    DULoxetine (CYMBALTA) 60 MG capsule Take 60 mg by mouth daily.    febuxostat (ULORIC) 40 MG tablet Take 40 mg by mouth daily.    fenofibrate 160 MG tablet Take 160 mg by mouth daily.    gabapentin (NEURONTIN) 800 MG tablet Take 800 mg by mouth 2 (two) times daily.     lamoTRIgine (LAMICTAL) 25 MG tablet Take 25 mg by mouth daily.     levETIRAcetam (KEPPRA) 500 MG tablet Take 1,000 mg by mouth 2 (two) times daily.    lidocaine (LIDODERM) 5 % Place 1 patch onto the skin daily. Remove & Discard patch within 12 hours or as directed by MD    metFORMIN (GLUCOPHAGE) 1000 MG tablet Take 1,000 mg by mouth 2 (two) times daily with a meal.    nicotine (NICODERM CQ - DOSED IN MG/24 HOURS) 21 mg/24hr patch Place 21 mg onto the skin daily.    oxybutynin (DITROPAN-XL) 5 MG 24 hr tablet Take 5 mg by mouth at bedtime.     polyethylene glycol (MIRALAX / GLYCOLAX) packet Take 17 g by mouth daily.    QUEtiapine (SEROQUEL) 25 MG tablet Take 25 mg by mouth at bedtime.    nitroGLYCERIN (NITROSTAT) 0.4 MG SL tablet Place 0.4 mg under the tongue every 5 (five) minutes as needed for chest pain.      STOP taking these medications     buprenorphine (SUBUTEX) 2 MG SUBL SL tablet        No Known Allergies Follow-up Information    Follow up with Cheral Almas, MD In 2 weeks.   Specialty:  Orthopedic Surgery   Why:  For suture removal, For wound re-check   Contact information:   [address] [place] [address] [phone]       Schedule an appointment as soon as possible for a visit in 1 week to follow up.   Why:  f/u with MD at SNF.       The results of significant diagnostics from this hospitalization (including imaging, microbiology, ancillary and  laboratory) are listed below for reference.    Significant Diagnostic Studies: Ct Angio Head W/cm &/or Wo Cm  08/08/2014   EXAM: CT ANGIOGRAPHY HEAD AND NECK  TECHNIQUE: Multidetector CT imaging of the head and neck was performed using the standard protocol during bolus administration of intravenous contrast. Multiplanar CT image reconstructions and MIPs were obtained to evaluate the vascular anatomy. Carotid stenosis measurements (when applicable) are obtained utilizing NASCET criteria, using the distal internal carotid diameter as the denominator.  COMPARISON:  None.  FINDINGS: CTA NECK  Aortic arch: Visualize aortic arch of normal caliber. Scattered calcified atheromatous plaque present within the aortic arch in at the origin of the great vessels, particularly the left subclavian artery. No high-grade stenosis seen at the origin of the great vessels. Visualized subclavian arteries widely patent.  Right carotid system: Right common carotid artery well opacified from its origin to the carotid bifurcation. Calcified and noncalcified plaque present  about the right carotid bifurcation. There is associated short-segment stenosis of approximately 60% by NASCET criteria about the right carotid bifurcation. Distally, the right internal carotid artery is well opacified to the skullbase without stenosis, occlusion, or dissection.  Left carotid system: Left common carotid artery well opacified from its origin to the carotid bifurcation. There is calcified and noncalcified plaque about the left carotid bifurcation extending into the proximal left internal carotid artery. There is associated irregular short segment stenosis of approximately 50-60% by NASCET criteria within the proximal left ICA. Focal contrast filled outpouching extending from the posterior aspect of the proximal left ICA near the area of greatest stenosis likely reflects a small penetrating atheromatous plaque. This area of atheromatous stenosis and irregularity extends from the left carotid bifurcation, and measures approximately 15 mm in length. Distally, the left ICA is well opacified to the skullbase without stenosis, dissection, or occlusion.  Vertebral arteries:Both vertebral arteries arise from the subclavian arteries. The left vertebral artery is dominant. Vertebral arteries are well opacified along their entire course without evidence for dissection, occlusion, or stenosis. There is focal calcified plaque at the origin of the right vertebral artery. Mild atheromatous plaque present within the distal left vertebral artery as it courses into the cranial vault.  Skeleton: Vertebral bodies are normally aligned with preservation of the normal cervical lordosis. Mild degenerative disc disease present at C5-6. No acute osseous abnormality. No worrisome lytic or blastic osseous lesion.  Other neck: No acute soft tissue abnormality within the neck. No adenopathy. Visualized superior mediastinum within normal limits. Mild atelectatic changes seen dependently within the visualized lung bases.  Endotracheal and enteric tubes in place. Fluid density within the nasopharynx likely related intubation.  CTA HEAD  Anterior circulation: The petrous segments of the internal carotid arteries are widely patent bilaterally. Scattered mild multi focal atheromatous plaque present within the cavernous carotid arteries without significant stenosis. Supraclinoid segments widely patent. A1 segments and anterior communicating artery are widely patent. The anterior cerebral arteries are patent proximally, although the right A2 segment is diminutive, likely related to prior right ACA infarct.  M1 segments patent bilaterally without proximal branch occlusion or significant stenosis. MCA branches are fairly symmetric bilaterally. No proximal occlusion or embolus identified within the proximal M2 segments.  Posterior circulation: The left vertebral artery is dominant mild to moderate multi focal stenoses. The diminutive right vertebral artery widely patent. Vertebrobasilar junction normal. Posterior inferior cerebral arteries patent bilaterally. Atheromatous irregularity present within the proximal basilar artery without significant stenosis or occlusion. Superior cerebellar arteries are patent bilaterally. Posterior  cerebral arteries opacified bilaterally. No arterial branch occlusion upper significant stenosis identified within the intracranial circulation.  Venous sinuses: No acute abnormality identified within the venous sinuses.  Anatomic variants: No anatomic variant.  No aneurysm.  Delayed phase:  No abnormal enhancement on delayed sequences.  IMPRESSION: CTA NECK IMPRESSION:  1. No acute abnormality within the major arterial vasculature of the neck. 2. Multifocal atheromatous plaque about the carotid bifurcations/proximal internal carotid arteries bilaterally. There is associated short-segment stenosis of approximately 60% at the right carotid bifurcation, with short-segment stenosis of approximately 50-60% at the left  carotid bifurcation.  CTA HEAD IMPRESSION:  1. No proximal branch occlusion or hemodynamically significant stenosis identified within the intracranial circulation. 2. Diminutive right A2 segment, likely related to prior right ACA infarct. 3. Mild multi focal atheromatous plaque within the cavernous carotid arteries bilaterally without significant stenosis. 4. Mild multi focal atherosclerotic irregularity and stenosis within the distal left V4 segment.   Electronically Signed   By: Rise Mu M.D.   On: 08/08/2014 23:06   Dg Chest 1 View  08/14/2014   CLINICAL DATA:  Fall.  Shortness of breath.  EXAM: CHEST  1 VIEW  COMPARISON:  08/09/2014.  FINDINGS: Interim extubation and removal of NG tube. Mediastinum hilar structures normal. Cardiomegaly with normal pulmonary vascularity. Low lung volumes with mild subsegmental atelectasis. No pleural effusion or pneumothorax. Stable cardiomegaly with normal pulmonary vascularity. Old left posterior sixth rib fracture.  IMPRESSION: 1. Interval removal of endotracheal tube and NG tube. 2. Low lung volumes with mild basilar atelectasis. 3. Stable cardiomegaly.   Electronically Signed   By: Maisie Fus  Register   On: 08/14/2014 07:21   Ct Head Wo Contrast  08/08/2014   CLINICAL DATA:  Stroke.  Left-sided weakness.  EXAM: CT HEAD WITHOUT CONTRAST  TECHNIQUE: Contiguous axial images were obtained from the base of the skull through the vertex without intravenous contrast.  COMPARISON:  06/19/2014  FINDINGS: There is mild diffuse low-attenuation within the subcortical and periventricular white matter compatible with chronic microvascular disease. Large area of encephalomalacia in the distribution of the right ACA territory is again identified compatible with chronic infarct. There is no evidence for acute stroke, intracranial hemorrhage or mass. The paranasal sinuses are clear. The mastoid air cells are also clear. The calvarium is intact.  IMPRESSION: 1. No acute  intracranial abnormalities identified. 2. Small vessel ischemic change and brain atrophy. 3. 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   Electronically Signed   By: Signa Kell M.D.   On: 08/08/2014 19:38   Ct Angio Neck W/cm &/or Wo/cm  08/08/2014   EXAM: CT ANGIOGRAPHY HEAD AND NECK  TECHNIQUE: Multidetector CT imaging of the head and neck was performed using the standard protocol during bolus administration of intravenous contrast. Multiplanar CT image reconstructions and MIPs were obtained to evaluate the vascular anatomy. Carotid stenosis measurements (when applicable) are obtained utilizing NASCET criteria, using the distal internal carotid diameter as the denominator.  COMPARISON:  None.  FINDINGS: CTA NECK  Aortic arch: Visualize aortic arch of normal caliber. Scattered calcified atheromatous plaque present within the aortic arch in at the origin of the great vessels, particularly the left subclavian artery. No high-grade stenosis seen at the origin of the great vessels. Visualized subclavian arteries widely patent.  Right carotid system: Right common carotid artery well opacified from its origin to the carotid bifurcation. Calcified and noncalcified plaque present about the right carotid bifurcation. There is associated short-segment stenosis of approximately 60% by NASCET criteria about  the right carotid bifurcation. Distally, the right internal carotid artery is well opacified to the skullbase without stenosis, occlusion, or dissection.  Left carotid system: Left common carotid artery well opacified from its origin to the carotid bifurcation. There is calcified and noncalcified plaque about the left carotid bifurcation extending into the proximal left internal carotid artery. There is associated irregular short segment stenosis of approximately 50-60% by NASCET criteria within the proximal left ICA. Focal contrast filled outpouching extending from the posterior aspect of the proximal left ICA near the area  of greatest stenosis likely reflects a small penetrating atheromatous plaque. This area of atheromatous stenosis and irregularity extends from the left carotid bifurcation, and measures approximately 15 mm in length. Distally, the left ICA is well opacified to the skullbase without stenosis, dissection, or occlusion.  Vertebral arteries:Both vertebral arteries arise from the subclavian arteries. The left vertebral artery is dominant. Vertebral arteries are well opacified along their entire course without evidence for dissection, occlusion, or stenosis. There is focal calcified plaque at the origin of the right vertebral artery. Mild atheromatous plaque present within the distal left vertebral artery as it courses into the cranial vault.  Skeleton: Vertebral bodies are normally aligned with preservation of the normal cervical lordosis. Mild degenerative disc disease present at C5-6. No acute osseous abnormality. No worrisome lytic or blastic osseous lesion.  Other neck: No acute soft tissue abnormality within the neck. No adenopathy. Visualized superior mediastinum within normal limits. Mild atelectatic changes seen dependently within the visualized lung bases. Endotracheal and enteric tubes in place. Fluid density within the nasopharynx likely related intubation.  CTA HEAD  Anterior circulation: The petrous segments of the internal carotid arteries are widely patent bilaterally. Scattered mild multi focal atheromatous plaque present within the cavernous carotid arteries without significant stenosis. Supraclinoid segments widely patent. A1 segments and anterior communicating artery are widely patent. The anterior cerebral arteries are patent proximally, although the right A2 segment is diminutive, likely related to prior right ACA infarct.  M1 segments patent bilaterally without proximal branch occlusion or significant stenosis. MCA branches are fairly symmetric bilaterally. No proximal occlusion or embolus  identified within the proximal M2 segments.  Posterior circulation: The left vertebral artery is dominant mild to moderate multi focal stenoses. The diminutive right vertebral artery widely patent. Vertebrobasilar junction normal. Posterior inferior cerebral arteries patent bilaterally. Atheromatous irregularity present within the proximal basilar artery without significant stenosis or occlusion. Superior cerebellar arteries are patent bilaterally. Posterior cerebral arteries opacified bilaterally. No arterial branch occlusion upper significant stenosis identified within the intracranial circulation.  Venous sinuses: No acute abnormality identified within the venous sinuses.  Anatomic variants: No anatomic variant.  No aneurysm.  Delayed phase:  No abnormal enhancement on delayed sequences.  IMPRESSION: CTA NECK IMPRESSION:  1. No acute abnormality within the major arterial vasculature of the neck. 2. Multifocal atheromatous plaque about the carotid bifurcations/proximal internal carotid arteries bilaterally. There is associated short-segment stenosis of approximately 60% at the right carotid bifurcation, with short-segment stenosis of approximately 50-60% at the left carotid bifurcation.  CTA HEAD IMPRESSION:  1. No proximal branch occlusion or hemodynamically significant stenosis identified within the intracranial circulation. 2. Diminutive right A2 segment, likely related to prior right ACA infarct. 3. Mild multi focal atheromatous plaque within the cavernous carotid arteries bilaterally without significant stenosis. 4. Mild multi focal atherosclerotic irregularity and stenosis within the distal left V4 segment.   Electronically Signed   By: Rise MuBenjamin  McClintock M.D.   On: 08/08/2014 23:06  Mr Brain Wo Contrast  08/09/2014   CLINICAL DATA:  Stroke.  Altered mental status.  EXAM: MRI HEAD WITHOUT CONTRAST  TECHNIQUE: Multiplanar, multiecho pulse sequences of the brain and surrounding structures were obtained  without intravenous contrast.  COMPARISON:  CT head 08/08/2014  FINDINGS: Image quality degraded by motion  Negative for acute infarct.  Chronic right ACA infarct as noted on the prior CT. Mild chronic microvascular ischemic change in the white matter. Brainstem and cerebellum intact.  Ventricle size is normal.  Cerebral volume is normal for age.  Negative for hemorrhage or mass. No shift of the midline structures.  IMPRESSION: Negative for acute infarct  Chronic right ACA infarct.  Chronic microvascular ischemia.   Electronically Signed   By: Marlan Palau M.D.   On: 08/09/2014 17:41   Dg Chest Port 1 View  08/09/2014   CLINICAL DATA:  Hypoxia  EXAM: PORTABLE CHEST - 1 VIEW  COMPARISON:  August 08, 2014  FINDINGS: Endotracheal tube tip is 2.4 cm above the carina. Nasogastric tube tip and side port are below the diaphragm. No pneumothorax. There is atelectatic change in the left base. Lungs elsewhere clear. Heart is borderline enlarged with pulmonary vascularity within normal limits. No adenopathy.  IMPRESSION: Tube positions as described without pneumothorax. Atelectasis left base. Lungs otherwise clear. No change in cardiac silhouette.   Electronically Signed   By: Bretta Bang III M.D.   On: 08/09/2014 07:59   Dg Chest Portable 1 View  08/08/2014   CLINICAL DATA:  Hypoxia  EXAM: PORTABLE CHEST - 1 VIEW  COMPARISON:  None.  FINDINGS: Endotracheal tube tip is 3.7 cm above the carina. No pneumothorax. Lungs are clear. Heart size and pulmonary vascularity are normal. No adenopathy. No bone lesions.  IMPRESSION: Endotracheal tube as described without pneumothorax. No edema or consolidation.   Electronically Signed   By: Bretta Bang III M.D.   On: 08/08/2014 21:03   Dg Abd Portable 1v  08/09/2014   CLINICAL DATA:  Encounter for OG tube placement.  EXAM: PORTABLE ABDOMEN - 1 VIEW  COMPARISON:  None.  FINDINGS: Tip and side port of the enteric tube below the diaphragm in the stomach. Mild gaseous  gastric distention. No dilated bowel loops to suggest obstruction.  IMPRESSION: Tip and side port of the enteric tube below the diaphragm in the stomach.   Electronically Signed   By: Rubye Oaks M.D.   On: 08/09/2014 01:17   Dg C-arm 61-120 Min  08/15/2014   CLINICAL DATA:  59 year old male with a history of femur fracture repair  EXAM: DG C-ARM 61-120 MIN; LEFT FEMUR 2 VIEWS  COMPARISON:  Plain film 08/14/2014  FLUOROSCOPY TIME:  Radiation Exposure Index (as provided by the fluoroscopic device): Op note  If the device does not provide the exposure index:  Fluoroscopy Time:  1 minutes 25 seconds  Number of Acquired Images:  6  FINDINGS: Intraoperative fluoroscopic spot images of ORIF.  Antegrade intra medullary nail with parallel screw fixation at the femoral neck and parallel screw fixation at the distal left femur via a lateral approach. Anatomic alignment maintained across the fracture site, with no complicating features identified.  IMPRESSION: Fluoroscopic images of ORIF for fixation of left femoral fracture with antegrade intra medullary rod and proximal and distal fixation as above. Anatomic alignment is maintained with no complicating features.  Please refer to the dictated operative report for full details of intraoperative findings and procedure.  Signed,  Yvone Neu. Loreta Ave, DO  [facility]  [facility]   Electronically Signed   By: Gilmer Mor D.O.   On: 08/15/2014 20:05   Dg Hip Unilat With Pelvis 2-3 Views Left  08/14/2014   CLINICAL DATA:  Fall.  Hip pain  EXAM: LEFT HIP (WITH PELVIS) 2-3 VIEWS  COMPARISON:  None.  FINDINGS: Spiral fracture mid left femur below the prosthesis.  Chronic intertrochanteric fracture on the left is healed. Compression screw and rod in the proximal femur in good position. Left hip joint is normal.  Right hip joint is normal.  No other fracture.  IMPRESSION: Spiral fracture mid left femur.  Left femur is reported  separately.   Electronically Signed   By: Marlan Palau M.D.   On: 08/14/2014 07:21   Dg Femur Min 2 Views Left  08/15/2014   CLINICAL DATA:  59 year old male with a history of femur fracture repair  EXAM: DG C-ARM 61-120 MIN; LEFT FEMUR 2 VIEWS  COMPARISON:  Plain film 08/14/2014  FLUOROSCOPY TIME:  Radiation Exposure Index (as provided by the fluoroscopic device): Op note  If the device does not provide the exposure index:  Fluoroscopy Time:  1 minutes 25 seconds  Number of Acquired Images:  6  FINDINGS: Intraoperative fluoroscopic spot images of ORIF.  Antegrade intra medullary nail with parallel screw fixation at the femoral neck and parallel screw fixation at the distal left femur via a lateral approach. Anatomic alignment maintained across the fracture site, with no complicating features identified.  IMPRESSION: Fluoroscopic images of ORIF for fixation of left femoral fracture with antegrade intra medullary rod and proximal and distal fixation as above. Anatomic alignment is maintained with no complicating features.  Please refer to the dictated operative report for full details of intraoperative findings and procedure.  Signed,  Yvone Neu. Loreta Ave, DO  [facility]  [facility]   Electronically Signed   By: Gilmer Mor D.O.   On: 08/15/2014 20:05   Dg Femur Min 2 Views Left  08/14/2014   CLINICAL DATA:  Fall, severe leg pain and foreshortening.  EXAM: LEFT FEMUR 2 VIEWS  COMPARISON:  Contemporaneously pelvis/hip radiographs  FINDINGS: Spiral fracture through the mid femoral shaft 2.5 cm below the level of the intra medullary rod tip. Medial and posterior displacement of the distal component. Underlying osteopenia.  IMPRESSION: Oblique/spiral fracture of the mid left femoral shaft as above.   Electronically Signed   By: Jearld Lesch M.D.   On: 08/14/2014 07:22    Microbiology: Recent Results (from the past 240 hour(s))  Culture, respiratory  (NON-Expectorated)     Status: None   Collection Time: 08/09/14  6:55 PM  Result Value Ref Range Status   Specimen Description TRACHEAL ASPIRATE  Final   Special Requests Normal  Final   Gram Stain   Final    MODERATE WBC PRESENT,BOTH PMN AND MONONUCLEAR MODERATE SQUAMOUS EPITHELIAL CELLS PRESENT MODERATE GRAM POSITIVE COCCI IN CLUSTERS IN CHAINS IN PAIRS FEW GRAM POSITIVE RODS RARE GRAM NEGATIVE RODS    Culture   Final    FEW STREPTOCOCCUS,BETA HEMOLYTIC NOT GROUP A Performed at [facility]    Report Status 08/12/2014 FINAL  Final  Culture, blood (routine x 2)     Status: None   Collection Time: 08/09/14  7:00 PM  Result Value Ref Range Status   Specimen Description BLOOD LEFT HAND  Final   Special Requests BOTTLES DRAWN AEROBIC ONLY 1CC  Final   Culture   Final  NO GROWTH 5 DAYS Note: Culture results may be compromised due to an inadequate volume of blood received in culture bottles. Performed at [facility]    Report Status 08/16/2014 FINAL  Final  Culture, blood (routine x 2)     Status: None   Collection Time: 08/09/14  7:10 PM  Result Value Ref Range Status   Specimen Description BLOOD RIGHT HAND  Final   Special Requests BOTTLES DRAWN AEROBIC ONLY 1CC  Final   Culture   Final    NO GROWTH 5 DAYS Note: Culture results may be compromised due to an inadequate volume of blood received in culture bottles. Performed at [facility]    Report Status 08/16/2014 FINAL  Final  Urine culture     Status: None   Collection Time: 08/10/14  9:48 AM  Result Value Ref Range Status   Specimen Description URINE, CATHETERIZED  Final   Special Requests NONE  Final   Colony Count NO GROWTH Performed at [facility]   Final   Culture NO GROWTH Performed at [facility]   Final   Report Status 08/11/2014 FINAL  Final  Surgical pcr screen     Status: None   Collection Time: 08/14/14 10:14 PM  Result Value Ref Range Status   MRSA, PCR  NEGATIVE NEGATIVE Final   Staphylococcus aureus NEGATIVE NEGATIVE Final    Comment:        The Xpert SA Assay (FDA approved for NASAL specimens in patients over 20 years of age), is one component of a comprehensive surveillance program.  Test performance has been validated by [facility] for patients greater than or equal to 60 year old. It is not intended to diagnose infection nor to guide or monitor treatment.      Labs: Basic Metabolic Panel:  Recent Labs Lab 08/12/14 0220 08/13/14 0806 08/14/14 0701 08/15/14 0605 08/16/14 0802 08/17/14 0632  NA 138 141 139 139 141 141  K 3.8 4.1 4.5 3.7 4.3 3.9  CL 101 105 104 105 105 106  CO2 28 27 26 25 24 26   GLUCOSE 116* 105* 128* 99 123* 95  BUN <5* 6 11 8 12 13   CREATININE 0.71 0.69 0.73 0.62 0.81 0.70  CALCIUM 8.4 9.1 9.2 8.8 8.5 8.5  MG 1.3*  --   --   --   --   --   PHOS 3.6  --   --   --   --   --    Liver Function Tests:  Recent Labs Lab 08/13/14 0806 08/14/14 0701  AST 45* 52*  ALT 36 40  ALKPHOS 20* 20*  BILITOT 0.7 0.6  PROT 6.2 7.3  ALBUMIN 3.0* 3.6   No results for input(s): LIPASE, AMYLASE in the last 168 hours. No results for input(s): AMMONIA in the last 168 hours. CBC:  Recent Labs Lab 08/12/14 0220 08/14/14 0701 08/15/14 0605 08/16/14 0802 08/17/14 0632  WBC 3.6* 5.2 4.8 6.1 6.2  NEUTROABS  --  3.3  --   --   --   HGB 11.9* 12.1* 11.0* 10.0* 9.1*  HCT 36.4* 37.3* 34.0* 30.2* 27.9*  MCV 86.3 87.4 86.1 85.3 86.1  PLT 147* PLATELET CLUMPS NOTED ON SMEAR, UNABLE TO ESTIMATE 207 225 238   Cardiac Enzymes: No results for input(s): CKTOTAL, CKMB, CKMBINDEX, TROPONINI in the last 168 hours. BNP: BNP (last 3 results) No results for input(s): BNP in the last 8760 hours.  ProBNP (last 3 results) No results for  input(s): PROBNP in the last 8760 hours.  CBG:  Recent Labs Lab 08/16/14 0852 08/16/14 1601 08/16/14 2034 08/17/14 08/17/14 0359  GLUCAP 162* 138* 128* 162* 123*        Signed:  Develle Sievers MD [facility] 08/17/2014, 11:06 AM

## 2014-08-17 NOTE — Discharge Planning (Signed)
Patient will discharge today per MD order. Patient will discharge to (return) [facility] RN to call report prior to transportation to [phone] Transportation: [facility]  CSW sent discharge summary to SNF for review.  Packet is complete.  RN, patient and brother aware of discharge plans.  Vickii PennaGina Jozelyn Kuwahara, [name] [phone]  Psychiatric & Orthopedics (5N 1-16) Clinical Social Worker

## 2014-08-17 NOTE — Progress Notes (Signed)
   Subjective:  Patient reports pain as mild.  No events.  Objective:   VITALS:   Filed Vitals:   08/16/14 2029 08/17/14 0000 08/17/14 0400 08/17/14 0510  BP: 119/67   102/67  Pulse: 66   65  Temp: 97.7 F (36.5 C)   99 F (37.2 C)  TempSrc: Oral     Resp: 18 18 18 18   SpO2: 93% 93% 93% 95%    Pleasantly confused Dressings c/d/i Foot wwp NVI   Lab Results  Component Value Date   WBC 6.2 08/17/2014   HGB 9.1* 08/17/2014   HCT 27.9* 08/17/2014   MCV 86.1 08/17/2014   PLT 238 08/17/2014     Assessment/Plan:  2 Days Post-Op   - stable from ortho standpoint - SNF pending  Cheral AlmasXu, Kijuana Ruppel Michael 08/17/2014, 9:57 AM [phone]

## 2014-08-17 NOTE — Care Management Note (Signed)
CARE MANAGEMENT NOTE 08/17/2014  Patient:  Jacinto HalimHOLLAR,Joven   Account Number:  [id number]  Date Initiated:  08/16/2014  Documentation initiated by:  Vance PeperBRADY,Danai Gotto  Subjective/Objective Assessment:   59 yr old male admitted after a fall with a left femur fracture. Patient underwent left femur IM Nailing.     Action/Plan:   [facility]. Social worker has arranged. DME will be provided at next venue.   Anticipated DC Date:  08/17/2014   Anticipated DC Plan:  SKILLED NURSING FACILITY  In-house referral  Clinical Social Worker      DC Planning Services  CM consult      Choice offered to / List presented to:     DME arranged  NA        HH arranged  NA      Status of service:  Completed, signed off Medicare Important Message given?   (If response is "NO", the following Medicare IM given date fields will be blank) Date Medicare IM given:   Medicare IM given by:   Date Additional Medicare IM given:   Additional Medicare IM given by:    Discharge Disposition:  SKILLED NURSING FACILITY  Per UR Regulation:  Reviewed for med. necessity/level of care/duration of stay  If discussed at Long Length of Stay Meetings, dates discussed:

## 2014-08-17 NOTE — OR Nursing (Signed)
Late entry on 08-17-2014.  Patient left OR room at 1957.  Or chart officially verified on 4-22 as well by Gerlene FeeValerie Pieter Fooks, RN.

## 2014-08-17 NOTE — Progress Notes (Signed)
[facility].

## 2014-08-20 ENCOUNTER — Encounter (HOSPITAL_COMMUNITY): Payer: Self-pay | Admitting: Orthopaedic Surgery

## 2014-09-25 ENCOUNTER — Telehealth (HOSPITAL_BASED_OUTPATIENT_CLINIC_OR_DEPARTMENT_OTHER): Payer: Self-pay | Admitting: Emergency Medicine

## 2015-01-04 ENCOUNTER — Emergency Department (HOSPITAL_COMMUNITY): Payer: Medicare Other

## 2015-01-04 ENCOUNTER — Encounter (HOSPITAL_COMMUNITY): Payer: Self-pay | Admitting: Emergency Medicine

## 2015-01-04 ENCOUNTER — Inpatient Hospital Stay (HOSPITAL_COMMUNITY)
Admission: EM | Admit: 2015-01-04 | Discharge: 2015-01-06 | DRG: 303 | Disposition: A | Discharge status: Home / Self Care | Payer: Medicare Other | Attending: Internal Medicine | Admitting: Internal Medicine

## 2015-01-04 DIAGNOSIS — I69354 Hemiplegia and hemiparesis following cerebral infarction affecting left non-dominant side: Secondary | ICD-10-CM

## 2015-01-04 DIAGNOSIS — K219 Gastro-esophageal reflux disease without esophagitis: Secondary | ICD-10-CM | POA: Diagnosis present

## 2015-01-04 DIAGNOSIS — Z79891 Long term (current) use of opiate analgesic: Secondary | ICD-10-CM

## 2015-01-04 DIAGNOSIS — I209 Angina pectoris, unspecified: Secondary | ICD-10-CM | POA: Diagnosis present

## 2015-01-04 DIAGNOSIS — Z993 Dependence on wheelchair: Secondary | ICD-10-CM

## 2015-01-04 DIAGNOSIS — Z79899 Other long term (current) drug therapy: Secondary | ICD-10-CM

## 2015-01-04 DIAGNOSIS — F419 Anxiety disorder, unspecified: Secondary | ICD-10-CM | POA: Diagnosis present

## 2015-01-04 DIAGNOSIS — F259 Schizoaffective disorder, unspecified: Secondary | ICD-10-CM | POA: Diagnosis present

## 2015-01-04 DIAGNOSIS — E785 Hyperlipidemia, unspecified: Secondary | ICD-10-CM | POA: Diagnosis present

## 2015-01-04 DIAGNOSIS — Z8249 Family history of ischemic heart disease and other diseases of the circulatory system: Secondary | ICD-10-CM | POA: Diagnosis not present

## 2015-01-04 DIAGNOSIS — I25119 Atherosclerotic heart disease of native coronary artery with unspecified angina pectoris: Secondary | ICD-10-CM | POA: Diagnosis not present

## 2015-01-04 DIAGNOSIS — F320 Major depressive disorder, single episode, mild: Secondary | ICD-10-CM | POA: Diagnosis not present

## 2015-01-04 DIAGNOSIS — R079 Chest pain, unspecified: Secondary | ICD-10-CM | POA: Diagnosis not present

## 2015-01-04 DIAGNOSIS — Z7982 Long term (current) use of aspirin: Secondary | ICD-10-CM

## 2015-01-04 DIAGNOSIS — E119 Type 2 diabetes mellitus without complications: Secondary | ICD-10-CM

## 2015-01-04 DIAGNOSIS — R0602 Shortness of breath: Secondary | ICD-10-CM | POA: Diagnosis present

## 2015-01-04 DIAGNOSIS — I25118 Atherosclerotic heart disease of native coronary artery with other forms of angina pectoris: Secondary | ICD-10-CM | POA: Diagnosis not present

## 2015-01-04 DIAGNOSIS — G8194 Hemiplegia, unspecified affecting left nondominant side: Secondary | ICD-10-CM

## 2015-01-04 DIAGNOSIS — I2510 Atherosclerotic heart disease of native coronary artery without angina pectoris: Secondary | ICD-10-CM | POA: Diagnosis present

## 2015-01-04 DIAGNOSIS — G8929 Other chronic pain: Secondary | ICD-10-CM | POA: Diagnosis present

## 2015-01-04 DIAGNOSIS — G40909 Epilepsy, unspecified, not intractable, without status epilepticus: Secondary | ICD-10-CM | POA: Diagnosis present

## 2015-01-04 DIAGNOSIS — G9340 Encephalopathy, unspecified: Secondary | ICD-10-CM

## 2015-01-04 DIAGNOSIS — F329 Major depressive disorder, single episode, unspecified: Secondary | ICD-10-CM | POA: Diagnosis present

## 2015-01-04 DIAGNOSIS — I208 Other forms of angina pectoris: Secondary | ICD-10-CM | POA: Diagnosis present

## 2015-01-04 DIAGNOSIS — I2089 Other forms of angina pectoris: Secondary | ICD-10-CM | POA: Diagnosis present

## 2015-01-04 DIAGNOSIS — Z66 Do not resuscitate: Secondary | ICD-10-CM | POA: Diagnosis present

## 2015-01-04 DIAGNOSIS — I10 Essential (primary) hypertension: Secondary | ICD-10-CM | POA: Diagnosis present

## 2015-01-04 DIAGNOSIS — Z8673 Personal history of transient ischemic attack (TIA), and cerebral infarction without residual deficits: Secondary | ICD-10-CM

## 2015-01-04 DIAGNOSIS — F17210 Nicotine dependence, cigarettes, uncomplicated: Secondary | ICD-10-CM | POA: Diagnosis present

## 2015-01-04 DIAGNOSIS — Z720 Tobacco use: Secondary | ICD-10-CM | POA: Diagnosis present

## 2015-01-04 HISTORY — DX: Tobacco use: Z72.0

## 2015-01-04 HISTORY — DX: Epilepsy, unspecified, not intractable, without status epilepticus: G40.909

## 2015-01-04 HISTORY — DX: Schizoaffective disorder, unspecified: F25.9

## 2015-01-04 HISTORY — DX: Other chronic pain: G89.29

## 2015-01-04 HISTORY — DX: Anxiety disorder, unspecified: F41.9

## 2015-01-04 HISTORY — DX: Major depressive disorder, single episode, unspecified: F32.9

## 2015-01-04 HISTORY — DX: Personal history of transient ischemic attack (TIA), and cerebral infarction without residual deficits: Z86.73

## 2015-01-04 HISTORY — DX: Hyperlipidemia, unspecified: E78.5

## 2015-01-04 HISTORY — DX: Atherosclerotic heart disease of native coronary artery without angina pectoris: I25.10

## 2015-01-04 LAB — MAGNESIUM: Magnesium: 1.8 mg/dL (ref 1.7–2.4)

## 2015-01-04 LAB — CBC
HCT: 41.6 % (ref 39.0–52.0)
HEMOGLOBIN: 13.5 g/dL (ref 13.0–17.0)
MCH: 26.7 pg (ref 26.0–34.0)
MCHC: 32.5 g/dL (ref 30.0–36.0)
MCV: 82.4 fL (ref 78.0–100.0)
Platelets: 213 10*3/uL (ref 150–400)
RBC: 5.05 MIL/uL (ref 4.22–5.81)
RDW: 17.6 % — AB (ref 11.5–15.5)
WBC: 7.1 10*3/uL (ref 4.0–10.5)

## 2015-01-04 LAB — GLUCOSE, CAPILLARY: Glucose-Capillary: 86 mg/dL (ref 65–99)

## 2015-01-04 LAB — COMPREHENSIVE METABOLIC PANEL
ALBUMIN: 3.8 g/dL (ref 3.5–5.0)
ALT: 47 U/L (ref 17–63)
AST: 50 U/L — AB (ref 15–41)
Alkaline Phosphatase: 26 U/L — ABNORMAL LOW (ref 38–126)
Anion gap: 9 (ref 5–15)
BUN: 8 mg/dL (ref 6–20)
CO2: 25 mmol/L (ref 22–32)
Calcium: 9.1 mg/dL (ref 8.9–10.3)
Chloride: 104 mmol/L (ref 101–111)
Creatinine, Ser: 0.8 mg/dL (ref 0.61–1.24)
GFR calc Af Amer: >60 mL/min (ref >60)
GFR calc non Af Amer: >60 mL/min (ref >60)
GLUCOSE: 79 mg/dL (ref 65–99)
POTASSIUM: 4.1 mmol/L (ref 3.5–5.1)
SODIUM: 138 mmol/L (ref 135–145)
Total Bilirubin: 0.2 mg/dL — ABNORMAL LOW (ref 0.3–1.2)
Total Protein: 6.5 g/dL (ref 6.5–8.1)

## 2015-01-04 LAB — CBG MONITORING, ED: Glucose-Capillary: 74 mg/dL (ref 65–99)

## 2015-01-04 LAB — D-DIMER, QUANTITATIVE (NOT AT ARMC): D DIMER QUANT: 0.3 ug{FEU}/mL (ref 0.00–0.48)

## 2015-01-04 LAB — I-STAT TROPONIN, ED: Troponin i, poc: 0 ng/mL (ref 0.00–0.08)

## 2015-01-04 LAB — TSH: TSH: 0.814 u[IU]/mL (ref 0.350–4.500)

## 2015-01-04 LAB — HEPARIN LEVEL (UNFRACTIONATED): Heparin Unfractionated: 0.13 IU/mL — ABNORMAL LOW (ref 0.30–0.70)

## 2015-01-04 LAB — TROPONIN I: Troponin I: <0.03 ng/mL (ref <0.031)

## 2015-01-04 MED ORDER — FEBUXOSTAT 40 MG PO TABS
40.0000 mg | ORAL_TABLET | Freq: Every day | ORAL | Status: DC
Start: 2015-01-05 — End: 2015-01-06
  Administered 2015-01-05 – 2015-01-06 (×2): 40 mg via ORAL
  Filled 2015-01-04 (×2): qty 1

## 2015-01-04 MED ORDER — CARVEDILOL 3.125 MG PO TABS
3.1250 mg | ORAL_TABLET | Freq: Two times a day (BID) | ORAL | Status: DC
  Filled 2015-01-04 (×5): qty 1

## 2015-01-04 MED ORDER — ACETAMINOPHEN 325 MG PO TABS
650.0000 mg | ORAL_TABLET | ORAL | Status: DC | PRN
  Filled 2015-01-04: qty 2

## 2015-01-04 MED ORDER — BISACODYL 5 MG PO TBEC: 5.0000 mg | DELAYED_RELEASE_TABLET | Freq: Every day | ORAL | Status: DC | PRN

## 2015-01-04 MED ORDER — OXYCODONE-ACETAMINOPHEN 7.5-325 MG PO TABS
1.0000 | ORAL_TABLET | Freq: Three times a day (TID) | ORAL | Status: DC | PRN
  Administered 2015-01-04 – 2015-01-06 (×5): 1 via ORAL
  Filled 2015-01-04 (×6): qty 1

## 2015-01-04 MED ORDER — ASPIRIN 300 MG RE SUPP: 300.0000 mg | RECTAL | Status: AC

## 2015-01-04 MED ORDER — GABAPENTIN 400 MG PO CAPS
800.0000 mg | ORAL_CAPSULE | Freq: Two times a day (BID) | ORAL | Status: DC
  Administered 2015-01-04 – 2015-01-06 (×4): 800 mg via ORAL
  Filled 2015-01-04 (×6): qty 2

## 2015-01-04 MED ORDER — AMLODIPINE BESYLATE 10 MG PO TABS
10.0000 mg | ORAL_TABLET | Freq: Every day | ORAL | Status: DC
  Administered 2015-01-05 – 2015-01-06 (×2): 10 mg via ORAL
  Filled 2015-01-04 (×3): qty 1

## 2015-01-04 MED ORDER — HEPARIN BOLUS VIA INFUSION
2000.0000 [IU] | Freq: Once | INTRAVENOUS | Status: AC
  Administered 2015-01-04: 2000 [IU] via INTRAVENOUS
  Filled 2015-01-04: qty 2000

## 2015-01-04 MED ORDER — HEPARIN BOLUS VIA INFUSION
4000.0000 [IU] | Freq: Once | INTRAVENOUS | Status: AC
  Administered 2015-01-04: 4000 [IU] via INTRAVENOUS
  Filled 2015-01-04: qty 4000

## 2015-01-04 MED ORDER — SODIUM CHLORIDE 0.9 % IJ SOLN
3.0000 mL | Freq: Two times a day (BID) | INTRAMUSCULAR | Status: DC
Start: 2015-01-04 — End: 2015-01-06
  Administered 2015-01-04 – 2015-01-06 (×2): 3 mL via INTRAVENOUS

## 2015-01-04 MED ORDER — SODIUM CHLORIDE 0.9 % IV SOLN: 250.0000 mL | INTRAVENOUS | Status: DC | PRN

## 2015-01-04 MED ORDER — NICOTINE 21 MG/24HR TD PT24
21.0000 mg | MEDICATED_PATCH | Freq: Every day | TRANSDERMAL | Status: DC
  Administered 2015-01-04 – 2015-01-06 (×3): 21 mg via TRANSDERMAL
  Filled 2015-01-04 (×3): qty 1

## 2015-01-04 MED ORDER — ACETAMINOPHEN 325 MG PO TABS: 650.0000 mg | ORAL_TABLET | ORAL | Status: DC | PRN

## 2015-01-04 MED ORDER — NYSTATIN 100000 UNIT/GM EX CREA
1.0000 | TOPICAL_CREAM | Freq: Two times a day (BID) | CUTANEOUS | Status: DC | PRN
Start: 2015-01-04 — End: 2015-01-06
  Administered 2015-01-04 – 2015-01-05 (×2): 1 via TOPICAL
  Filled 2015-01-04 (×2): qty 15

## 2015-01-04 MED ORDER — OXYBUTYNIN CHLORIDE ER 5 MG PO TB24
5.0000 mg | ORAL_TABLET | Freq: Every day | ORAL | Status: DC
  Administered 2015-01-04 – 2015-01-05 (×2): 5 mg via ORAL
  Filled 2015-01-04 (×4): qty 1

## 2015-01-04 MED ORDER — ASPIRIN 81 MG PO CHEW
324.0000 mg | CHEWABLE_TABLET | ORAL | Status: AC
  Administered 2015-01-04: 324 mg via ORAL
  Filled 2015-01-04: qty 4

## 2015-01-04 MED ORDER — GABAPENTIN 800 MG PO TABS
800.0000 mg | ORAL_TABLET | Freq: Two times a day (BID) | ORAL | Status: DC
  Filled 2015-01-04: qty 1

## 2015-01-04 MED ORDER — FENOFIBRATE 160 MG PO TABS
160.0000 mg | ORAL_TABLET | Freq: Every day | ORAL | Status: DC
  Administered 2015-01-05 – 2015-01-06 (×2): 160 mg via ORAL
  Filled 2015-01-04 (×2): qty 1

## 2015-01-04 MED ORDER — ATORVASTATIN CALCIUM 20 MG PO TABS
20.0000 mg | ORAL_TABLET | Freq: Every day | ORAL | Status: DC
  Administered 2015-01-04: 20 mg via ORAL
  Filled 2015-01-04 (×3): qty 1

## 2015-01-04 MED ORDER — SODIUM CHLORIDE 0.9 % IJ SOLN: 3.0000 mL | INTRAMUSCULAR | Status: DC | PRN

## 2015-01-04 MED ORDER — LISINOPRIL 10 MG PO TABS
10.0000 mg | ORAL_TABLET | Freq: Every day | ORAL | Status: DC
  Administered 2015-01-05 – 2015-01-06 (×2): 10 mg via ORAL
  Filled 2015-01-04 (×2): qty 1

## 2015-01-04 MED ORDER — LAMOTRIGINE 25 MG PO TABS
25.0000 mg | ORAL_TABLET | Freq: Every day | ORAL | Status: DC
Start: 2015-01-05 — End: 2015-01-06
  Administered 2015-01-05 – 2015-01-06 (×2): 25 mg via ORAL
  Filled 2015-01-04 (×2): qty 1

## 2015-01-04 MED ORDER — PANTOPRAZOLE SODIUM 40 MG PO TBEC
40.0000 mg | DELAYED_RELEASE_TABLET | Freq: Every day | ORAL | Status: DC
  Administered 2015-01-05 – 2015-01-06 (×2): 40 mg via ORAL
  Filled 2015-01-04 (×2): qty 1

## 2015-01-04 MED ORDER — DICLOFENAC SODIUM 1 % TD GEL
2.0000 g | Freq: Two times a day (BID) | TRANSDERMAL | Status: DC | PRN
Start: 2015-01-04 — End: 2015-01-06
  Filled 2015-01-04: qty 100

## 2015-01-04 MED ORDER — DIAZEPAM 5 MG PO TABS
5.0000 mg | ORAL_TABLET | Freq: Three times a day (TID) | ORAL | Status: DC | PRN
  Administered 2015-01-04 – 2015-01-06 (×6): 5 mg via ORAL
  Filled 2015-01-04 (×5): qty 1

## 2015-01-04 MED ORDER — DULOXETINE HCL 60 MG PO CPEP
60.0000 mg | ORAL_CAPSULE | Freq: Every day | ORAL | Status: DC
  Administered 2015-01-05: 60 mg via ORAL
  Filled 2015-01-04 (×3): qty 1

## 2015-01-04 MED ORDER — ONDANSETRON HCL 4 MG/2ML IJ SOLN: 4.0000 mg | Freq: Four times a day (QID) | INTRAMUSCULAR | Status: DC | PRN

## 2015-01-04 MED ORDER — MORPHINE SULFATE (PF) 2 MG/ML IV SOLN
2.0000 mg | Freq: Once | INTRAVENOUS | Status: AC
  Administered 2015-01-04: 2 mg via INTRAVENOUS
  Filled 2015-01-04: qty 1

## 2015-01-04 MED ORDER — LEVETIRACETAM 500 MG PO TABS
1000.0000 mg | ORAL_TABLET | Freq: Two times a day (BID) | ORAL | Status: DC
  Administered 2015-01-04 – 2015-01-06 (×4): 1000 mg via ORAL
  Filled 2015-01-04 (×6): qty 2

## 2015-01-04 MED ORDER — ASPIRIN 81 MG PO CHEW
81.0000 mg | CHEWABLE_TABLET | Freq: Every day | ORAL | Status: DC
  Administered 2015-01-05 – 2015-01-06 (×2): 81 mg via ORAL

## 2015-01-04 MED ORDER — NITROGLYCERIN 0.4 MG SL SUBL
0.4000 mg | SUBLINGUAL_TABLET | SUBLINGUAL | Status: DC | PRN
  Administered 2015-01-04 (×2): 0.4 mg via SUBLINGUAL

## 2015-01-04 MED ORDER — QUETIAPINE FUMARATE 25 MG PO TABS
25.0000 mg | ORAL_TABLET | Freq: Every day | ORAL | Status: DC
  Administered 2015-01-04 – 2015-01-05 (×2): 25 mg via ORAL
  Filled 2015-01-04 (×4): qty 1

## 2015-01-04 MED ORDER — HEPARIN (PORCINE) IN NACL 100-0.45 UNIT/ML-% IJ SOLN
1350.0000 [IU]/h | INTRAMUSCULAR | Status: DC
  Administered 2015-01-04: 1100 [IU]/h via INTRAVENOUS
  Administered 2015-01-05 – 2015-01-06 (×2): 1350 [IU]/h via INTRAVENOUS
  Filled 2015-01-04 (×5): qty 250

## 2015-01-04 MED ORDER — INSULIN ASPART 100 UNIT/ML ~~LOC~~ SOLN: 0.0000 [IU] | Freq: Three times a day (TID) | SUBCUTANEOUS | Status: DC

## 2015-01-04 NOTE — ED Provider Notes (Signed)
CSN: [id number]     Arrival date & time 01/04/15  1102 History   First MD Initiated Contact with Patient 01/04/15 1117     Chief Complaint  Patient presents with  . Chest Pain    HPI  Terrance Cook is a 59 year old man with history of coronary artery disease (last cath in Winter 2015 was normal, per the patient, however there are no records here at [facility]), ischemic stroke, type 2 diabetes, tobacco use, left femur fracture, presenting with chest pain that started last night after the [employer] lost to the [employer]. He describes the chest pain as "cinderblocks" on his chest, radiating down his left arm, with associated diaphoresis and dyspnea. Nitroglycerin did not relieve his symptoms in the ambulance on his way over. He denies nausea, abdominal pain, pronounced shortness of breath, tearing pain to the back, or lower extremity edema.  Past Medical History  Diagnosis Date  . Stroke   . Coronary artery disease   . History of cardiac cath   . Diabetes mellitus without complication   . Hypertension    Past Surgical History  Procedure Laterality Date  . Cardiac surgery    . Femur fracture surgery    . Femur im nail Left 08/15/2014    Procedure: Operative Fixation left periprosthetic femur fracture;  Surgeon: Tarry Kos, MD;  Location: [facility] OR;  Service: Orthopedics;  Laterality: Left;  . Hardware removal Left 08/15/2014    Procedure: HARDWARE REMOVAL;  Surgeon: Tarry Kos, MD;  Location: [facility] OR;  Service: Orthopedics;  Laterality: Left;   No family history on file. Social History  Substance Use Topics  . Smoking status: Current Every Day Smoker -- 0.50 packs/day for 45 years    Types: Cigarettes  . Smokeless tobacco: Never Used  . Alcohol Use: No    Review of Systems  Constitutional: Positive for diaphoresis. Negative for fever and chills.  Respiratory: Positive for shortness of breath. Negative for wheezing.   Cardiovascular: Positive for chest pain. Negative for palpitations and leg  swelling.  Gastrointestinal: Negative for nausea, vomiting and abdominal pain.  Skin: Negative for rash.  Neurological: Positive for weakness. Negative for dizziness, syncope, light-headedness and headaches.   Allergies  Review of patient's allergies indicates no known allergies.  Home Medications   Prior to Admission medications   Medication Sig Start Date End Date Taking? Authorizing Provider  amLODipine (NORVASC) 5 MG tablet Take 10 mg by mouth daily.    Historical Provider, MD  aspirin 81 MG chewable tablet Chew 81 mg by mouth daily.    Historical Provider, MD  atenolol (TENORMIN) 50 MG tablet Take 1 tablet (50 mg total) by mouth daily. 08/13/14   Shanker Levora Dredge, MD  atorvastatin (LIPITOR) 20 MG tablet Take 20 mg by mouth daily.    Historical Provider, MD  bisacodyl (DULCOLAX) 5 MG EC tablet Take 1 tablet (5 mg total) by mouth daily as needed for moderate constipation. 08/17/14   Rodolph Bong, MD  diazepam (VALIUM) 5 MG tablet Take 1 tablet (5 mg total) by mouth every 8 (eight) hours as needed for anxiety. HOLD FOR LETHARGY OR SEDATION 08/17/14   Rodolph Bong, MD  DULoxetine (CYMBALTA) 60 MG capsule Take 60 mg by mouth daily.    Historical Provider, MD  enoxaparin (LOVENOX) 40 MG/0.4ML injection Inject 0.4 mLs (40 mg total) into the skin daily. 08/15/14   Tarry Kos, MD  febuxostat (ULORIC) 40 MG tablet Take 40 mg by mouth  daily.    Historical Provider, MD  fenofibrate 160 MG tablet Take 160 mg by mouth daily.    Historical Provider, MD  gabapentin (NEURONTIN) 800 MG tablet Take 800 mg by mouth 2 (two) times daily.     Historical Provider, MD  lamoTRIgine (LAMICTAL) 25 MG tablet Take 25 mg by mouth daily.     Historical Provider, MD  levETIRAcetam (KEPPRA) 500 MG tablet Take 1,000 mg by mouth 2 (two) times daily.    Historical Provider, MD  lidocaine (LIDODERM) 5 % Place 1 patch onto the skin daily. Remove & Discard patch within 12 hours or as directed by MD    Historical  Provider, MD  metFORMIN (GLUCOPHAGE) 1000 MG tablet Take 1,000 mg by mouth 2 (two) times daily with a meal.    Historical Provider, MD  nicotine (NICODERM CQ - DOSED IN MG/24 HOURS) 21 mg/24hr patch Place 21 mg onto the skin daily.    Historical Provider, MD  nitroGLYCERIN (NITROSTAT) 0.4 MG SL tablet Place 0.4 mg under the tongue every 5 (five) minutes as needed for chest pain.    Historical Provider, MD  oxybutynin (DITROPAN-XL) 5 MG 24 hr tablet Take 5 mg by mouth at bedtime.    Historical Provider, MD  oxyCODONE (OXY IR/ROXICODONE) 5 MG immediate release tablet Take 1-3 tablets (5-15 mg total) by mouth every 4 (four) hours as needed. 08/15/14   Naiping Donnelly Stager, MD  polyethylene glycol (MIRALAX / GLYCOLAX) packet Take 17 g by mouth daily.    Historical Provider, MD  QUEtiapine (SEROQUEL) 25 MG tablet Take 25 mg by mouth at bedtime.    Historical Provider, MD   BP 114/70 mmHg  Pulse 53  Temp(Src) 98.1 F (36.7 C) (Oral)  Resp 16  Ht  (1.727 m)  Wt 240 lb (108.863 kg)  BMI 36.50 kg/m2  SpO2 100%   Physical Exam  Constitutional: He appears well-developed and well-nourished. He appears distressed.  HENT:  Head: Normocephalic and atraumatic.  Eyes: Conjunctivae and EOM are normal.  Neck: Normal range of motion. Neck supple. No JVD present.  Cardiovascular: Regular rhythm, S1 normal and S2 normal.  Bradycardia present.  PMI is not displaced.  Exam reveals distant heart sounds. Exam reveals no S3.   No murmur heard. Pulmonary/Chest: Effort normal and breath sounds normal. No respiratory distress. He has no wheezes. He has no rales. He exhibits tenderness.  Abdominal: Soft. Bowel sounds are normal. He exhibits no distension. There is no tenderness.  Musculoskeletal: Normal range of motion.  Skin: Skin is warm. No rash noted. He is diaphoretic.   ED Course  Procedures (including critical care time)  Labs Review Labs Reviewed  CBC - Abnormal; Notable for the following:    RDW 17.6 (*)     All other components within normal limits  COMPREHENSIVE METABOLIC PANEL - Abnormal; Notable for the following:    AST 50 (*)    Alkaline Phosphatase 26 (*)    Total Bilirubin 0.2 (*)    All other components within normal limits  I-STAT TROPOININ, ED    Imaging Review Dg Chest 2 View  01/04/2015   CLINICAL DATA:  Chest pain with sob since last night  EXAM: CHEST  2 VIEW  COMPARISON:  08/14/2014  FINDINGS: Stable mild to moderate cardiac enlargement. Vascular pattern normal. Lungs clear. No pleural effusions. Moderate compression deformity mid thoracic spine age uncertain.  IMPRESSION: No acute cardiopulmonary process. Moderate thoracic vertebral body compression deformity of uncertain age.  Electronically Signed   By: Esperanza Heir M.D.   On: 01/04/2015 11:44     I have personally reviewed and evaluated these images and lab results as part of my medical decision-making.   EKG Interpretation   Date/Time:  Friday January 04 2015 11:07:10 EDT Ventricular Rate:  49 PR Interval:  147 QRS Duration: 95 QT Interval:  424 QTC Calculation: 383 R Axis:   62 Text Interpretation:  Sinus bradycardia Abnormal R-wave progression, early  transition Borderline ST elevation, anterolateral leads since last tracing  no significant change Abnormal ekg Confirmed by Hyacinth Meeker  MD, BRIAN ([id number])  on 01/04/2015 12:09:57 PM      MDM   Final diagnoses:  Typical angina    Terrance Cook is a 59 year old man with numerous risk factors for acute coronary syndrome including ischemic stroke, hypertension, type 2 diabetes, and tobacco use, presenting with typical angina with non-ischemic EKG notable for sinus bradycardia and a normal troponin. There are no ST changes in the inferior leads suggestive of involvement of the RCA. However, given his numerous risk factors and history of typical angina, we will admit to medicine to trend troponins and undergo further cardiac evaluation.   Selina Cooley, MD 01/04/15  1343  Eber Hong, MD 01/04/15 [phone]

## 2015-01-04 NOTE — ED Notes (Signed)
Patient has returned from being out of the department; placed back on monitor, continuous pulse oximetry and blood pressure cuff 

## 2015-01-04 NOTE — Consult Note (Signed)
CARDIOLOGY CONSULT NOTE   Patient ID: Terrance Cook MRN: [id number] DOB/AGE: 1956-03-03 59 y.o.  Admit date: 01/04/2015  Primary Physician   No PCP Per Patient Primary Cardiologist   New  Reason for Consultation   Chest pain  HPI: Terrance Cook is a 59 y.o. 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  He is a poor historian. He states that he had normal cath Winter of  last year however note sure where it was. States "I lived in [place] all of my years, might be at [facility] hospital". Since yesterday he has been having intermittent at Left upper chest to substernal area. Relieved by it self. This morning the chest pain is more severe that was associated with SOB. Describes as "cinder blocks" on his chest and its constant. Felt like similar to previous cardiac pain. He has received ASA 324mg  and 3 nitro PTA with no relief by EMS.  He denies nausea, vomiting, orthopnea, PND or LE edema.   In ED, He was given IV morphine and started on IV heparin. SL nitro x 2. Still having 9/10 chest pain. EKG showed borderline ST elevation in inferior lateral leads. POC trop is negative. Tele showed sinus rhythm at rate of 40-60s.   Echo 08/10/14 showed LV EF of 55-60%, No WM abnormality, grade 1 DD. CXR showed no acute cardiopulmonary process. Moderate thoracic vertebral body compression deformity of uncertain age.  Past Medical History  Diagnosis Date  . Stroke   . Coronary artery disease   . History of cardiac cath   . Diabetes mellitus without complication   . Hypertension   . Hyperlipidemia 01/04/2015  . Tobacco use 01/04/2015  . MDD (major depressive disorder) 01/04/2015  . Anxiety 01/04/2015  . 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 Schizoaffective disorder  01/04/2015  . H/O: CVA (cerebrovascular accident) 01/04/2015  . Chronic pain 01/04/2015  . CAD (coronary artery disease) 01/04/2015     Past Surgical History  Procedure Laterality Date  . Cardiac surgery    . Femur fracture surgery    . Femur im nail Left 08/15/2014    Procedure: Operative Fixation left periprosthetic femur fracture;  Surgeon: Tarry Kos, MD;  Location: [facility] OR;  Service: Orthopedics;  Laterality: Left;  . Hardware removal Left 08/15/2014    Procedure: HARDWARE REMOVAL;  Surgeon: Tarry Kos, MD;  Location: [facility] OR;  Service: Orthopedics;  Laterality: Left;    No Known Allergies  I have reviewed the patient's current medications . heparin  4,000 Units Intravenous Once   . heparin     acetaminophen, nitroGLYCERIN  Prior to Admission medications   Medication Sig Start Date End Date Taking? Authorizing Provider  amLODipine (NORVASC) 5 MG tablet Take 10 mg by mouth daily.   Yes Historical Provider, MD  aspirin 81 MG chewable tablet Chew 81 mg by mouth daily.   Yes Historical Provider, MD  atenolol (TENORMIN) 50 MG tablet Take 1 tablet (50 mg total) by mouth daily. 08/13/14  Yes Shanker Levora Dredge, MD  atorvastatin (LIPITOR) 20 MG tablet Take 20 mg by mouth daily.   Yes Historical Provider, MD  bisacodyl (DULCOLAX) 5 MG EC tablet Take 1 tablet (5 mg total) by mouth daily as needed for moderate constipation. 08/17/14  Yes Reuel Boom  Ninfa Linden, MD  diazepam (VALIUM) 5 MG tablet Take 1 tablet (5 mg total) by mouth every 8 (eight) hours as needed for anxiety. HOLD FOR LETHARGY OR SEDATION 08/17/14  Yes Rodolph Bong, MD  diclofenac sodium (VOLTAREN) 1 % GEL Apply 2 g topically 2 (two) times daily as needed (pain). Apply to lower back, left hip, and left knee   Yes Historical Provider, MD  DULoxetine (CYMBALTA) 60 MG capsule Take 60 mg by mouth daily.   Yes Historical Provider, MD  febuxostat (ULORIC) 40 MG tablet Take 40 mg by mouth daily.   Yes Historical Provider, MD  fenofibrate 160  MG tablet Take 160 mg by mouth daily.   Yes Historical Provider, MD  gabapentin (NEURONTIN) 800 MG tablet Take 800 mg by mouth 2 (two) times daily.    Yes Historical Provider, MD  lamoTRIgine (LAMICTAL) 25 MG tablet Take 25 mg by mouth daily.    Yes Historical Provider, MD  levETIRAcetam (KEPPRA) 500 MG tablet Take 1,000 mg by mouth 2 (two) times daily.   Yes Historical Provider, MD  lisinopril (PRINIVIL,ZESTRIL) 10 MG tablet Take 10 mg by mouth daily.   Yes Historical Provider, MD  metFORMIN (GLUCOPHAGE) 1000 MG tablet Take 1,000 mg by mouth 2 (two) times daily with a meal.   Yes Historical Provider, MD  nitroGLYCERIN (NITROSTAT) 0.4 MG SL tablet Place 0.4 mg under the tongue every 5 (five) minutes as needed for chest pain.   Yes Historical Provider, MD  nystatin cream (MYCOSTATIN) Apply 1 application topically 2 (two) times daily as needed (groin redness/itching).   Yes Historical Provider, MD  oxybutynin (DITROPAN-XL) 5 MG 24 hr tablet Take 5 mg by mouth at bedtime.   Yes Historical Provider, MD  oxyCODONE-acetaminophen (PERCOCET) 7.5-325 MG per tablet Take 1 tablet by mouth 3 (three) times daily as needed for moderate pain or severe pain.   Yes Historical Provider, MD  QUEtiapine (SEROQUEL) 25 MG tablet Take 25 mg by mouth at bedtime.   Yes Historical Provider, MD  enoxaparin (LOVENOX) 40 MG/0.4ML injection Inject 0.4 mLs (40 mg total) into the skin daily. Patient not taking: Reported on 01/04/2015 08/15/14   Tarry Kos, MD     Social History   Social History  . Marital Status: Divorced    Spouse Name: N/A  . Number of Children: N/A  . Years of Education: N/A   Occupational History  . Not on file.   Social History Main Topics  . Smoking status: Current Every Day Smoker -- 0.50 packs/day for 45 years    Types: Cigarettes  . Smokeless tobacco: Never Used  . Alcohol Use: No  . Drug Use: No  . Sexual Activity: No   Other Topics Concern  . Not on file   Social History Narrative      Family Status  Relation Status Death Age  . Mother Deceased   . Father Deceased    Family History  Problem Relation Age of Onset  . Heart attack Mother      ROS:  Full 14 point review of systems complete and found to be negative unless listed above.  Physical Exam: Blood pressure 102/71, pulse 49, temperature 98.1 F (36.7 C), temperature source Oral, resp. rate 13, height  (1.727 m), weight 240 lb (108.863 kg), SpO2 94 %.  General: Well developed, well nourished, male in no acute distress Head: Eyes PERRLA, No xanthomas. Normocephalic and atraumatic, oropharynx without edema or exudate. Mild left facial droop.  Lungs: Resp regular and unlabored, CTA. Heart: RRR no s3, s4, or murmurs..   Neck: No carotid bruits. No lymphadenopathy. No JVD. Abdomen: Bowel sounds present, abdomen soft and non-tender without masses or hernias noted. Msk:  No spine or cva tenderness. No weakness, no joint deformities or effusions. Extremities: No clubbing, cyanosis or edema. DP/PT/Radials 2+ and equal bilaterally. Neuro: Alert and oriented X 3. Left arm and leg weakness.  Psych:  Good affect, responds appropriately Skin: No rashes or lesions noted.  Labs:   Lab Results  Component Value Date   WBC 7.1 01/04/2015   HGB 13.5 01/04/2015   HCT 41.6 01/04/2015   MCV 82.4 01/04/2015   PLT 213 01/04/2015   No results for input(s): INR in the last 72 hours.  Recent Labs Lab 01/04/15 1138  NA 138  K 4.1  CL 104  CO2 25  BUN 8  CREATININE 0.80  CALCIUM 9.1  PROT 6.5  BILITOT 0.2*  ALKPHOS 26*  ALT 47  AST 50*  GLUCOSE 79  ALBUMIN 3.8    No results found for: PROBNP Lab Results  Component Value Date   CHOL 135 08/09/2014   HDL 21* 08/09/2014   LDLCALC 38 08/09/2014   TRIG 380* 08/09/2014    Echo: 4/45/16 LV EF: 55% -  60%  ------------------------------------------------------------------- Indications:   CVA  436.  ------------------------------------------------------------------- History:  PMH: Left hemiparesis. Acute respiratory failure. Altered mental status. Risk factors: Hypertension. Diabetes mellitus.  ------------------------------------------------------------------- Study Conclusions  - Left ventricle: The cavity size was normal. Systolic function was normal. The estimated ejection fraction was in the range of 55% to 60%. Wall motion was normal; there were no regional wall motion abnormalities. Doppler parameters are consistent with abnormal left ventricular relaxation (grade 1 diastolic dysfunction). Acoustic contrast opacification revealed no evidence ofthrombus.  ECG:  Vent. rate 49 BPM PR interval 147 ms QRS duration 95 ms QT/QTc 424/383 ms P-R-T axes 58 62 46  Radiology:  Dg Chest 2 View  01/04/2015   CLINICAL DATA:  Chest pain with sob since last night  EXAM: CHEST  2 VIEW  COMPARISON:  08/14/2014  FINDINGS: Stable mild to moderate cardiac enlargement. Vascular pattern normal. Lungs clear. No pleural effusions. Moderate compression deformity mid thoracic spine age uncertain.  IMPRESSION: No acute cardiopulmonary process. Moderate thoracic vertebral body compression deformity of uncertain age.   Electronically Signed   By: Esperanza Heir M.D.   On: 01/04/2015 11:44    ASSESSMENT AND PLAN:     1. Anginal pain - Worsening L upper/substernal chest pain. Felt like "cinderblocks" and similar to previous cardiac pain. His symptoms is concerning for ACS. EKG with minimal ST elevation in anterolateral lead at rate of 49 bpm, could be early repolarization. POC trop is negative. Tele with sinus bradycardia at rate of 40-60s.  - He has received  ASA 324mg  and 3 nitro PTA with no relief by EMS. 2 X SL nitro, morphine in ER. Still having 9/10 chest pain.  - Recommended step down admission cycle troponin and tele observation.  - Continue ASA and lisinopril, reduce  atenolol to 25mg . - Get 2 D echo. Depends on enzyme and echo result. Will decide further plan.  2. HTN - Stable - Continue home regimen  3. HL  - Continue statin. Will get lipid panel  4 Tobacco abuse - Advice cessation. Education given  5. MDD (major depressive disorder) - per primary 6. Anxiety - per primary 7. 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 Schizoaffective disorder  - per primary 9. DM - per primary. Hold metformin.   SignedManson Passey, PA 01/04/2015, 2:56 PM Pager [phone]  Co-Sign MD   Agree with note by Chelsea Aus PA-C  + CRF. Mult prior CVAs. Hemiparetic. Lives in SNF.. Admitted with CP. Ongoing. On IV Hep. EKG shows early repol changes. Agree with admit step down, cycle enz. Has had cath before at [facility], says he has had 'blockages'. Pain does not sound ischemic. Exam benign. Decide cath vs Lexi in AM after other data back.    Runell Gess, M.D., FACP, [facility], [facility] [address] [place], [place]  [address]  [phone] 01/04/2015 3:57 PM

## 2015-01-04 NOTE — ED Notes (Signed)
Patient undressed, in gown, on monitor, continuous pulse oximetry and blood pressure cuff 

## 2015-01-04 NOTE — H&P (Signed)
[facility] History and Physical  Terrance Cook ZOX:[id number] DOB: May 22, 1955 DOA: 01/04/2015  Referring physician: Dr. Hyacinth Meeker PCP: No PCP Per Patient   Chief Complaint: Chest pain  HPI: Terrance Cook is a 59 y.o. male  With history of coronary artery disease, CVA 3 per patient with residual left lower extremity paresis, diabetes mellitus, hypertension, hyperlipidemia, ongoing tobacco use, history of seizure disorder secondary to CVA, wheelchair bound, resident at [facility] who presents to the ED with 1 day history of worsening substernal left-sided chest pain radiating to the left upper extremity and described as a cinder block was placed on his chest. Patient with some associated shortness of breath, diaphoresis. Patient stated the chest pain had been constant. Patient denies any palpitations, no fever, no chills, no nausea, no vomiting, no cough, no melanotic, no hematemesis, no hematochezia, no dysuria, no diet, no constipation, no abdominal pain, no nausea, no vomiting. Patient was given nitroglycerin via EMS with no significant improvement with his symptoms. Patient was seen in the ED and given IV morphine, sublingual nitroglycerin with no significant relief in his chest pain. EKG showed some borderline ST elevation in the inferior leads. Point-of-care troponin was negative. Patient was also noted to be bradycardic. [facility] were called to admit the patient for further evaluation and management.   Review of Systems: As per history of present illness otherwise negative. Constitutional:  No weight loss, night sweats, Fevers, chills, fatigue.  HEENT:  No headaches, Difficulty swallowing,Tooth/dental problems,Sore throat,  No sneezing, itching, ear ache, nasal congestion, post nasal drip,  Cardio-vascular:  No chest pain, Orthopnea, PND, swelling in lower extremities, anasarca, dizziness, palpitations  GI:  No heartburn, indigestion, abdominal pain, nausea, vomiting,  diarrhea, change in bowel habits, loss of appetite  Resp:  No shortness of breath with exertion or at rest. No excess mucus, no productive cough, No non-productive cough, No coughing up of blood.No change in color of mucus.No wheezing.No chest wall deformity  Skin:  no rash or lesions.  GU:  no dysuria, change in color of urine, no urgency or frequency. No flank pain.  Musculoskeletal:  No joint pain or swelling. No decreased range of motion. No back pain.  Psych:  No change in mood or affect. No depression or anxiety. No memory loss.   Past Medical History  Diagnosis Date  . Stroke   . Coronary artery disease   . History of cardiac cath   . Diabetes mellitus without complication   . Hypertension   . Hyperlipidemia 01/04/2015  . Tobacco use 01/04/2015  . MDD (major depressive disorder) 01/04/2015  . Anxiety 01/04/2015  . 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 Schizoaffective disorder 01/04/2015  . H/O: CVA (cerebrovascular accident) 01/04/2015  . Chronic pain 01/04/2015  . CAD (coronary artery disease) 01/04/2015   Past Surgical History  Procedure Laterality Date  . Cardiac surgery    . Femur fracture surgery    . Femur im nail Left 08/15/2014    Procedure: Operative Fixation left periprosthetic femur fracture;  Surgeon: Tarry Kos, MD;  Location: [facility] OR;  Service: Orthopedics;  Laterality: Left;  . Hardware removal Left 08/15/2014    Procedure: HARDWARE REMOVAL;  Surgeon: Tarry Kos, MD;  Location: [facility] OR;  Service: Orthopedics;  Laterality: Left;   Social History:  reports that he has been smoking Cigarettes.  He has a 22.5 pack-year smoking history. He has never used smokeless tobacco. He reports that he does not drink alcohol or use illicit  drugs.  No Known Allergies  Family History  Problem Relation Age of Onset  . Heart attack Mother    mother deceased from an acute MI at age 63. Father deceased at age 65 from MVA. Patient is wheelchair bound at a nursing facility.  Prior  to Admission medications   Medication Sig Start Date End Date Taking? Authorizing Provider  amLODipine (NORVASC) 5 MG tablet Take 10 mg by mouth daily.   Yes Historical Provider, MD  aspirin 81 MG chewable tablet Chew 81 mg by mouth daily.   Yes Historical Provider, MD  atenolol (TENORMIN) 50 MG tablet Take 1 tablet (50 mg total) by mouth daily. 08/13/14  Yes Shanker Levora Dredge, MD  atorvastatin (LIPITOR) 20 MG tablet Take 20 mg by mouth daily.   Yes Historical Provider, MD  bisacodyl (DULCOLAX) 5 MG EC tablet Take 1 tablet (5 mg total) by mouth daily as needed for moderate constipation. 08/17/14  Yes Rodolph Bong, MD  diazepam (VALIUM) 5 MG tablet Take 1 tablet (5 mg total) by mouth every 8 (eight) hours as needed for anxiety. HOLD FOR LETHARGY OR SEDATION 08/17/14  Yes Rodolph Bong, MD  diclofenac sodium (VOLTAREN) 1 % GEL Apply 2 g topically 2 (two) times daily as needed (pain). Apply to lower back, left hip, and left knee   Yes Historical Provider, MD  DULoxetine (CYMBALTA) 60 MG capsule Take 60 mg by mouth daily.   Yes Historical Provider, MD  febuxostat (ULORIC) 40 MG tablet Take 40 mg by mouth daily.   Yes Historical Provider, MD  fenofibrate 160 MG tablet Take 160 mg by mouth daily.   Yes Historical Provider, MD  gabapentin (NEURONTIN) 800 MG tablet Take 800 mg by mouth 2 (two) times daily.    Yes Historical Provider, MD  lamoTRIgine (LAMICTAL) 25 MG tablet Take 25 mg by mouth daily.    Yes Historical Provider, MD  levETIRAcetam (KEPPRA) 500 MG tablet Take 1,000 mg by mouth 2 (two) times daily.   Yes Historical Provider, MD  lisinopril (PRINIVIL,ZESTRIL) 10 MG tablet Take 10 mg by mouth daily.   Yes Historical Provider, MD  metFORMIN (GLUCOPHAGE) 1000 MG tablet Take 1,000 mg by mouth 2 (two) times daily with a meal.   Yes Historical Provider, MD  nitroGLYCERIN (NITROSTAT) 0.4 MG SL tablet Place 0.4 mg under the tongue every 5 (five) minutes as needed for chest pain.   Yes Historical  Provider, MD  nystatin cream (MYCOSTATIN) Apply 1 application topically 2 (two) times daily as needed (groin redness/itching).   Yes Historical Provider, MD  oxybutynin (DITROPAN-XL) 5 MG 24 hr tablet Take 5 mg by mouth at bedtime.   Yes Historical Provider, MD  oxyCODONE-acetaminophen (PERCOCET) 7.5-325 MG per tablet Take 1 tablet by mouth 3 (three) times daily as needed for moderate pain or severe pain.   Yes Historical Provider, MD  QUEtiapine (SEROQUEL) 25 MG tablet Take 25 mg by mouth at bedtime.   Yes Historical Provider, MD  enoxaparin (LOVENOX) 40 MG/0.4ML injection Inject 0.4 mLs (40 mg total) into the skin daily. Patient not taking: Reported on 01/04/2015 08/15/14   Tarry Kos, MD   Physical Exam: Filed Vitals:   01/04/15 1300 01/04/15 1315 01/04/15 1330 01/04/15 1345  BP: 109/74 127/76 115/79 102/71  Pulse: 51 59 54 49  Temp:      TempSrc:      Resp: 10 14 14 13   Height:      Weight:  SpO2: 92% 99% 94% 94%    Wt Readings from Last 3 Encounters:  01/04/15 108.863 kg (240 lb)  08/12/14 112.4 kg (247 lb 12.8 oz)  06/19/14 113.399 kg (250 lb)    General:  Well-developed well-nourished laying on gurney in no acute cardiopulmonary distress. Speaking in full sentences.  Eyes: PERRLA, EOMI, normal lids, irises & conjunctiva ENT: grossly normal hearing, lips & tongue Neck: no LAD, masses or thyromegaly Cardiovascular: RRR, no m/r/g. No LE edema. Respiratory: CTA bilaterally, no w/r/r. Normal respiratory effort. Abdomen: soft, ntnd, positive bowel sounds, no rebound, no guarding Skin: no rash or induration seen on limited exam Musculoskeletal: 5/5 bilateral upper extremity strength. 5/5 right lower extremity strength. 2-3/5 left lower extremity strength.  Psychiatric: grossly normal mood and affect, speech fluent and appropriate Neurologic: Alert and oriented 3. Cranial nerves II through XII are grossly intact. No focal deficits.           Labs on Admission:  Basic  Metabolic Panel:  Recent Labs Lab 01/04/15 1138  NA 138  K 4.1  CL 104  CO2 25  GLUCOSE 79  BUN 8  CREATININE 0.80  CALCIUM 9.1   Liver Function Tests:  Recent Labs Lab 01/04/15 1138  AST 50*  ALT 47  ALKPHOS 26*  BILITOT 0.2*  PROT 6.5  ALBUMIN 3.8   No results for input(s): LIPASE, AMYLASE in the last 168 hours. No results for input(s): AMMONIA in the last 168 hours. CBC:  Recent Labs Lab 01/04/15 1138  WBC 7.1  HGB 13.5  HCT 41.6  MCV 82.4  PLT 213   Cardiac Enzymes: No results for input(s): CKTOTAL, CKMB, CKMBINDEX, TROPONINI in the last 168 hours.  BNP (last 3 results) No results for input(s): BNP in the last 8760 hours.  ProBNP (last 3 results) No results for input(s): PROBNP in the last 8760 hours.  CBG: No results for input(s): GLUCAP in the last 168 hours.  Radiological Exams on Admission: Dg Chest 2 View  01/04/2015   CLINICAL DATA:  Chest pain with sob since last night  EXAM: CHEST  2 VIEW  COMPARISON:  08/14/2014  FINDINGS: Stable mild to moderate cardiac enlargement. Vascular pattern normal. Lungs clear. No pleural effusions. Moderate compression deformity mid thoracic spine age uncertain.  IMPRESSION: No acute cardiopulmonary process. Moderate thoracic vertebral body compression deformity of uncertain age.   Electronically Signed   By: Esperanza Heir M.D.   On: 01/04/2015 11:44    EKG: Independently reviewed. Borderline ST elevation in leads 1, 2, 3, V3 through V6. Bradycardic.  Assessment/Plan Principal Problem:   Typical angina Active Problems:   Left hemiparesis   Diabetes mellitus type 2, controlled, without complications   Essential hypertension   Hyperlipidemia   Tobacco use   MDD (major depressive disorder)   Anxiety   Seizure disorder: Secondary to CVA   Schizoaffective disorder   H/O: CVA (cerebrovascular accident)   Chronic pain   CAD (coronary artery disease)   Angina at rest  #1 chest pain/typical angina Patient is  presenting with complaints of chest pain radiating to the left upper extremity described as a cinderblock sitting on his chest ongoing for the past 24 hours with some slight EKG changes. One of care troponin was negative. Patient with multiple risk factors of diabetes, hypertension, hyperlipidemia, ongoing tobacco use, coronary artery disease, prior history of CVA 3. Will admit patient to telemetry. Cycle cardiac enzymes every 6 hours 3. Check a TSH. Check d-dimer as patient is wheelchair-bound.  Check a fasting lipid panel. Will place patient on IV heparin. Check a 2-D echo. Will place patient on low-dose Coreg at 3.125 mg twice a day, continue aspirin, continue ACE inhibitor, Consult with cardiology for further evaluation and management.   #2 hypertension Will place on low-dose Coreg. Continue lisinopril, Norvasc.  #3 hyperlipidemia Check a fasting lipid panel. Continue statin. Continue fenofibrate.  #4 seizure disorder secondary to CVA Stable. Continue Keppra for seizure prophylaxis.  #5 tobacco abuse Tobacco cessation. Placed on a nicotine patch.  #6 schizoaffective disorder Stable. Continue Lamictal, Seroquel.  #7 chronic pain Continue home pain regimen.  #8 history of CVA Continue aspirin for secondary stroke prevention.  #9 major depressive disorder/anxiety Continue Cymbalta and Valium.  #10 diabetes mellitus Check a hemoglobin A1c. Place on sliding scale insulin.  #11 prophylaxis Heparin for DVT prophylaxis.  Code Status: DO NOT RESUSCITATE DVT Prophylaxis: IV heparin Family Communication: Updated patient. No family present. Disposition Plan: Admit to telemetry.  Time spent: 65 minutes  Kasiya Burck M.D. [facility] Pager [phone]

## 2015-01-04 NOTE — ED Notes (Signed)
t here via EMS from [facility] for eval of CP starting yesterday occuring intermittently, getting worse this morning. Pt received 324 ASA and 3 nitro PTA with no relief. Pt ahd a stroke in April with left leg paralysis.

## 2015-01-04 NOTE — Progress Notes (Signed)
Requested medical records from [facility]. Look for records in cath labs.  Lyann Hagstrom, PAC

## 2015-01-04 NOTE — ED Provider Notes (Signed)
The patient is a 59 year old male, he reports a history of a large stroke in the past in 2008, had another stroke in April of this year, he currently is bound to a wheelchair because of his left leg weakness, he reports having no history of heart attack but has had chest pain ever since last night when he was watching the football game. This was initially intermittent but this morning he states it has been there most of the day, associated with shortness of breath and some radiation to the left shoulder. On exam the patient has clear heart and lung sounds, soft abdomen, mild left facial droop, mild left arm weakness, significant left leg dense weakness. Right side is normal, speech is normal, no atrial fibrillation. The patient has an abnormal EKG, he has a prior abnormal EKG, he is slightly bradycardic, he has mild ST elevation but this is persistent throughout time. He reports having a normal heart catheterization and thinks that it was at this hospital however there are no records of this. He came from [place] at [facility] in the past, he is unsure exactly when that occurred.  We'll obtain labs, chest x-ray, anticipate admission for rule out as the patient is high risk with diabetes hypertension high cholesterol and prior significant vascular disease.   EKG Interpretation  Date/Time:  Friday January 04 2015 11:07:10 EDT Ventricular Rate:  49 PR Interval:  147 QRS Duration: 95 QT Interval:  424 QTC Calculation: 383 R Axis:   62 Text Interpretation:  Sinus bradycardia Abnormal R-wave progression, early transition Borderline ST elevation, anterolateral leads since last tracing no significant change Abnormal ekg Confirmed by Hyacinth Meeker  MD, Honestie Kulik ([id number]) on 01/04/2015 12:09:57 PM      I saw and evaluated the patient, reviewed the resident's note and I agree with the findings and plan.    Final diagnoses:  Typical angina        Eber Hong, MD 01/04/15 1534

## 2015-01-04 NOTE — Progress Notes (Addendum)
ANTICOAGULATION CONSULT NOTE - Initial Consult  Pharmacy Consult for heparin Indication: chest pain/ACS  No Known Allergies  Patient Measurements: Height:  (172.7 cm) Weight: 240 lb (108.863 kg) IBW/kg (Calculated) : 68.4 Heparin Dosing Weight: 92kg  Vital Signs: Temp: 98.1 F (36.7 C) (09/09 1109) Temp Source: Oral (09/09 1109) BP: 102/71 mmHg (09/09 1345) Pulse Rate: 49 (09/09 1345)  Labs:  Recent Labs  01/04/15 1138  HGB 13.5  HCT 41.6  PLT 213  CREATININE 0.80    Estimated Creatinine Clearance: 121.9 mL/min (by C-G formula based on Cr of 0.8).   Medical History: Past Medical History  Diagnosis Date  . Stroke   . Coronary artery disease   . History of cardiac cath   . Diabetes mellitus without complication   . Hypertension   . Hyperlipidemia 01/04/2015  . Tobacco use 01/04/2015  . MDD (major depressive disorder) 01/04/2015  . Anxiety 01/04/2015  . 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 Schizoaffective disorder 01/04/2015  . H/O: CVA (cerebrovascular accident) 01/04/2015  . Chronic pain 01/04/2015  . CAD (coronary artery disease) 01/04/2015    Medications:  Infusions:  . heparin      Assessment: 35 yof presented to the ED with CP. To start IV heparin for anticoagulation. Baseline CBC is WNL and he is not on anticoagulation PTA.  Goal of Therapy:  Heparin level 0.3-0.7 units/ml Monitor platelets by anticoagulation protocol: Yes   Plan:  - Heparin bolus 4000 units IV x 1 - Heparin gtt 1100 units/hr - Check a 6 hour heparin level - Daily HL and CBC  Rumbarger, Drake Leach 01/04/2015,2:40 PM   ADDENDUM:  Initial HL low at 0.13 on 1100 units/h. Will bolus 2000 units and increase heparin drip to 1350 units/h and check 6h HL. CBC wnl. No bleed/IV line issues per RN  Plan: Re-bolus heparin 2000 units Increase heparin IV to 1350 units/h 6h HL Daily HL/CBC Mon s/sx bleeding  Babs Bertin, PharmD Clinical Pharmacist Pager [phone] 01/04/2015 9:33  PM

## 2015-01-04 NOTE — ED Notes (Signed)
Attempted report 

## 2015-01-05 ENCOUNTER — Inpatient Hospital Stay (HOSPITAL_COMMUNITY): Payer: Medicare Other

## 2015-01-05 DIAGNOSIS — R079 Chest pain, unspecified: Secondary | ICD-10-CM

## 2015-01-05 DIAGNOSIS — G40909 Epilepsy, unspecified, not intractable, without status epilepticus: Secondary | ICD-10-CM

## 2015-01-05 DIAGNOSIS — I10 Essential (primary) hypertension: Secondary | ICD-10-CM

## 2015-01-05 DIAGNOSIS — I208 Other forms of angina pectoris: Secondary | ICD-10-CM

## 2015-01-05 DIAGNOSIS — Z720 Tobacco use: Secondary | ICD-10-CM

## 2015-01-05 DIAGNOSIS — E119 Type 2 diabetes mellitus without complications: Secondary | ICD-10-CM

## 2015-01-05 DIAGNOSIS — Z8673 Personal history of transient ischemic attack (TIA), and cerebral infarction without residual deficits: Secondary | ICD-10-CM

## 2015-01-05 DIAGNOSIS — E785 Hyperlipidemia, unspecified: Secondary | ICD-10-CM

## 2015-01-05 DIAGNOSIS — F251 Schizoaffective disorder, depressive type: Secondary | ICD-10-CM

## 2015-01-05 DIAGNOSIS — I25118 Atherosclerotic heart disease of native coronary artery with other forms of angina pectoris: Secondary | ICD-10-CM

## 2015-01-05 DIAGNOSIS — F419 Anxiety disorder, unspecified: Secondary | ICD-10-CM

## 2015-01-05 LAB — CBC
HCT: 38.7 % — ABNORMAL LOW (ref 39.0–52.0)
Hemoglobin: 12.8 g/dL — ABNORMAL LOW (ref 13.0–17.0)
MCH: 27.1 pg (ref 26.0–34.0)
MCHC: 33.1 g/dL (ref 30.0–36.0)
MCV: 82 fL (ref 78.0–100.0)
PLATELETS: 161 10*3/uL (ref 150–400)
RBC: 4.72 MIL/uL (ref 4.22–5.81)
RDW: 17.6 % — AB (ref 11.5–15.5)
WBC: 5.5 10*3/uL (ref 4.0–10.5)

## 2015-01-05 LAB — PROTIME-INR
INR: 1.34 (ref 0.00–1.49)
Prothrombin Time: 16.7 seconds — ABNORMAL HIGH (ref 11.6–15.2)

## 2015-01-05 LAB — TROPONIN I

## 2015-01-05 LAB — BASIC METABOLIC PANEL
Anion gap: 7 (ref 5–15)
BUN: 7 mg/dL (ref 6–20)
CHLORIDE: 106 mmol/L (ref 101–111)
CO2: 27 mmol/L (ref 22–32)
CREATININE: 0.63 mg/dL (ref 0.61–1.24)
Calcium: 8.8 mg/dL — ABNORMAL LOW (ref 8.9–10.3)
GFR calc Af Amer: >60 mL/min (ref >60)
Glucose, Bld: 87 mg/dL (ref 65–99)
Potassium: 3.5 mmol/L (ref 3.5–5.1)
SODIUM: 140 mmol/L (ref 135–145)

## 2015-01-05 LAB — LIPID PANEL
Cholesterol: 122 mg/dL (ref 0–200)
HDL: 31 mg/dL — AB (ref >40)
LDL Cholesterol: 62 mg/dL (ref 0–99)
Total CHOL/HDL Ratio: 3.9 RATIO
Triglycerides: 147 mg/dL (ref <150)
VLDL: 29 mg/dL (ref 0–40)

## 2015-01-05 LAB — GLUCOSE, CAPILLARY
GLUCOSE-CAPILLARY: 108 mg/dL — AB (ref 65–99)
GLUCOSE-CAPILLARY: 114 mg/dL — AB (ref 65–99)
Glucose-Capillary: 95 mg/dL (ref 65–99)

## 2015-01-05 LAB — HEPARIN LEVEL (UNFRACTIONATED)
HEPARIN UNFRACTIONATED: 0.42 [IU]/mL (ref 0.30–0.70)
HEPARIN UNFRACTIONATED: 0.4 [IU]/mL (ref 0.30–0.70)

## 2015-01-05 LAB — HEMOGLOBIN A1C
HEMOGLOBIN A1C: 6.2 % — AB (ref 4.8–5.6)
MEAN PLASMA GLUCOSE: 131 mg/dL

## 2015-01-05 NOTE — Progress Notes (Signed)
ANTICOAGULATION CONSULT NOTE - Follow Up Consult  Pharmacy Consult for heparin Indication: angina  Labs:  Recent Labs  01/04/15 1138 01/04/15 1443 01/04/15 2040 01/05/15 0300  HGB 13.5  --   --  12.8*  HCT 41.6  --   --  38.7*  PLT 213  --   --  161  HEPARINUNFRC  --   --  0.13* 0.42  CREATININE 0.80  --   --   --   TROPONINI  --  <0.03 <0.03  --      Assessment/Plan:  59yo male therapeutic on heparin after rate increase. Will continue gtt at current rate and confirm stable with additional level.   Vernard Gambles, PharmD, BCPS  01/05/2015,3:52 AM

## 2015-01-05 NOTE — Progress Notes (Signed)
ANTICOAGULATION CONSULT NOTE - Follow Up Consult  Pharmacy Consult for Heparin Indication: chest pain/ACS  No Known Allergies  Patient Measurements: Height:  (172.7 cm) Weight: 240 lb (108.863 kg) IBW/kg (Calculated) : 68.4 Heparin Dosing Weight: 92kg  Vital Signs: Temp: 97.9 F (36.6 C) (09/10 0404) Temp Source: Oral (09/10 0404) BP: 134/70 mmHg (09/10 0404) Pulse Rate: 50 (09/10 0404)  Labs:  Recent Labs  01/04/15 1138 01/04/15 1443 01/04/15 2040 01/05/15 0300 01/05/15 1000  HGB 13.5  --   --  12.8*  --   HCT 41.6  --   --  38.7*  --   PLT 213  --   --  161  --   LABPROT  --   --   --  16.7*  --   INR  --   --   --  1.34  --   HEPARINUNFRC  --   --  0.13* 0.42 0.40  CREATININE 0.80  --   --  0.63  --   TROPONINI  --  <0.03 <0.03 <0.03  --     Estimated Creatinine Clearance: 121.9 mL/min (by C-G formula based on Cr of 0.63).   Medications:  Heparin @ 1350 units/hr  Assessment: 59yom continues on heparin for chest pain with negative troponins. ECHO pending. Confirmatory heparin level is therapeutic at 0.40. CBC is stable. No bleeding reported.  Goal of Therapy:  Heparin level 0.3-0.7 units/ml Monitor platelets by anticoagulation protocol: Yes   Plan:  1) Continue heparin at 1350 units/hr 2) Follow up heparin level, CBC in AM   Fredrik Rigger 01/05/2015,10:59 AM
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
## 2015-01-05 NOTE — Progress Notes (Signed)
  Echocardiogram 2D Echocardiogram has been performed.  Terrance Cook 01/05/2015, 10:19 AM

## 2015-01-05 NOTE — Progress Notes (Signed)
[facility] PROGRESS NOTE  Terrance Cook WUJ:[id number] DOB: 20-May-1955 DOA: 01/04/2015 PCP: No PCP Per Patient  Assessment/Plan: #1 chest pain/typical angina Patient is presenting with complaints of chest pain radiating to the left upper extremity described as a cinderblock sitting on his chest ongoing for the past 24 hours with some slight EKG changes.  -troponin neg X 3 -patient has Echo: but results pending -per cardiology will pursuit Myoview in am -continue ASA, b-blocker, ACE and statins  #2 hypertension -Will continue current antihypertensive regimen -BP fair control  #3 hyperlipidemia -Continue statin and fenofibrate.  #4 seizure disorder secondary to CVA -Stable and no seizure activity appreciated -Continue Keppra for seizure prophylaxis.  #5 tobacco abuse -Tobacco cessation counseling provided.  -Placed on a nicotine patch.  #6 schizoaffective disorder -Stable. No SI or hallucinations -Continue Lamictal, Seroquel.  #7 chronic pain -Continue home pain regimen.  #8 history of CVA -Continue aspirin for secondary stroke prevention.  #9 major depressive disorder/anxiety -Continue Cymbalta and Valium.  #10 diabetes mellitus -Will follow A1C and continue SSI -Holding metformin while inpatient   #11 prophylaxis -On Heparin for DVT prophylaxis.  Code Status: DNR Family Communication: no family at bedside Disposition Plan: back to SNF when medically clear.   Consultants:  Cardiology   Procedures:  2-D echo: pending  Myoview: planned for 01/06/15  Antibiotics:  None   HPI/Subjective: No CP and no SOB. Patient reports feeling good now. Afebrile.  Objective: Filed Vitals:   01/05/15 1530  BP: 162/78  Pulse: 66  Temp: 98 F (36.7 C)  Resp: 17    Intake/Output Summary (Last 24 hours) at 01/05/15 1540 Last data filed at 01/04/15 2320  Gross per 24 hour  Intake      0 ml  Output    300 ml  Net   -300 ml   Filed Weights   01/04/15  1109 01/05/15 1500  Weight: 108.863 kg (240 lb) 98.7 kg (217 lb 9.5 oz)    Exam:   General:  Afebrile, no CP, no SOB  Cardiovascular: S1 and S2, no rubs or gallops; positive soft SEM  Respiratory: good air movement, no wheezing  Abdomen: soft, NT, ND, positive BS  Musculoskeletal: no edema or cyanosis   Data Reviewed: Basic Metabolic Panel:  Recent Labs Lab 01/04/15 1138 01/04/15 2040 01/05/15 0300  NA 138  --  140  K 4.1  --  3.5  CL 104  --  106  CO2 25  --  27  GLUCOSE 79  --  87  BUN 8  --  7  CREATININE 0.80  --  0.63  CALCIUM 9.1  --  8.8*  MG  --  1.8  --    Liver Function Tests:  Recent Labs Lab 01/04/15 1138  AST 50*  ALT 47  ALKPHOS 26*  BILITOT 0.2*  PROT 6.5  ALBUMIN 3.8   CBC:  Recent Labs Lab 01/04/15 1138 01/05/15 0300  WBC 7.1 5.5  HGB 13.5 12.8*  HCT 41.6 38.7*  MCV 82.4 82.0  PLT 213 161   Cardiac Enzymes:  Recent Labs Lab 01/04/15 1443 01/04/15 2040 01/05/15 0300  TROPONINI <0.03 <0.03 <0.03   CBG:  Recent Labs Lab 01/04/15 1412 01/04/15 2122 01/05/15 1113  GLUCAP 74 86 95   Studies: Dg Chest 2 View  01/04/2015   CLINICAL DATA:  Chest pain with sob since last night  EXAM: CHEST  2 VIEW  COMPARISON:  08/14/2014  FINDINGS: Stable mild to moderate cardiac enlargement. Vascular  pattern normal. Lungs clear. No pleural effusions. Moderate compression deformity mid thoracic spine age uncertain.  IMPRESSION: No acute cardiopulmonary process. Moderate thoracic vertebral body compression deformity of uncertain age.   Electronically Signed   By: Esperanza Heir M.D.   On: 01/04/2015 11:44    Scheduled Meds: . amLODipine  10 mg Oral Daily  . aspirin  81 mg Oral Daily  . atorvastatin  20 mg Oral q1800  . carvedilol  3.125 mg Oral BID WC  . DULoxetine  60 mg Oral Daily  . febuxostat  40 mg Oral Daily  . fenofibrate  160 mg Oral Daily  . gabapentin  800 mg Oral BID  . insulin aspart  0-15 Units Subcutaneous TID WC  .  lamoTRIgine  25 mg Oral Daily  . levETIRAcetam  1,000 mg Oral BID  . lisinopril  10 mg Oral Daily  . nicotine  21 mg Transdermal Daily  . oxybutynin  5 mg Oral QHS  . pantoprazole  40 mg Oral Q0600  . QUEtiapine  25 mg Oral QHS  . sodium chloride  3 mL Intravenous Q12H   Continuous Infusions: . 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 If 7PM-7AM, please contact night-coverage at [email or web address], password [facility] 01/05/2015, 3:40 PM  LOS: 1 day

## 2015-01-06 ENCOUNTER — Inpatient Hospital Stay (HOSPITAL_COMMUNITY): Payer: Medicare Other

## 2015-01-06 DIAGNOSIS — R079 Chest pain, unspecified: Secondary | ICD-10-CM

## 2015-01-06 DIAGNOSIS — G8929 Other chronic pain: Secondary | ICD-10-CM

## 2015-01-06 LAB — CBC
HEMATOCRIT: 40.8 % (ref 39.0–52.0)
HEMOGLOBIN: 13.3 g/dL (ref 13.0–17.0)
MCH: 26.8 pg (ref 26.0–34.0)
MCHC: 32.6 g/dL (ref 30.0–36.0)
MCV: 82.3 fL (ref 78.0–100.0)
Platelets: 161 10*3/uL (ref 150–400)
RBC: 4.96 MIL/uL (ref 4.22–5.81)
RDW: 17.7 % — ABNORMAL HIGH (ref 11.5–15.5)
WBC: 5.2 10*3/uL (ref 4.0–10.5)

## 2015-01-06 LAB — GLUCOSE, CAPILLARY
GLUCOSE-CAPILLARY: 100 mg/dL — AB (ref 65–99)
Glucose-Capillary: 150 mg/dL — ABNORMAL HIGH (ref 65–99)

## 2015-01-06 LAB — HEPARIN LEVEL (UNFRACTIONATED): HEPARIN UNFRACTIONATED: 0.34 [IU]/mL (ref 0.30–0.70)

## 2015-01-06 MED ORDER — TECHNETIUM TC 99M SESTAMIBI - CARDIOLITE
30.0000 | Freq: Once | INTRAVENOUS | Status: AC | PRN
  Administered 2015-01-06: 30 via INTRAVENOUS

## 2015-01-06 MED ORDER — REGADENOSON 0.4 MG/5ML IV SOLN
0.4000 mg | Freq: Once | INTRAVENOUS | Status: AC
  Administered 2015-01-06: 0.4 mg via INTRAVENOUS

## 2015-01-06 MED ORDER — PANTOPRAZOLE SODIUM 40 MG PO TBEC: 40.0000 mg | DELAYED_RELEASE_TABLET | Freq: Every day | ORAL | Status: AC

## 2015-01-06 MED ORDER — REGADENOSON 0.4 MG/5ML IV SOLN
INTRAVENOUS | Status: AC
  Filled 2015-01-06: qty 5

## 2015-01-06 MED ORDER — CARVEDILOL 3.125 MG PO TABS: 3.1250 mg | ORAL_TABLET | Freq: Two times a day (BID) | ORAL | Status: AC

## 2015-01-06 MED ORDER — NICOTINE 21 MG/24HR TD PT24: 21.0000 mg | MEDICATED_PATCH | Freq: Every day | TRANSDERMAL | Status: AC

## 2015-01-06 MED ORDER — TECHNETIUM TC 99M SESTAMIBI GENERIC - CARDIOLITE
10.8000 | Freq: Once | INTRAVENOUS | Status: AC | PRN
  Administered 2015-01-06: 11 via INTRAVENOUS

## 2015-01-06 MED ORDER — HEPARIN SODIUM (PORCINE) 5000 UNIT/ML IJ SOLN
5000.0000 [IU] | Freq: Three times a day (TID) | INTRAMUSCULAR | Status: DC
Start: 2015-01-06 — End: 2015-01-06

## 2015-01-06 NOTE — Progress Notes (Signed)
Discharged to home with family office visits in place teaching done  

## 2015-01-06 NOTE — Discharge Summary (Signed)
Physician Discharge Summary  Terrance Cook BJY:[id number] DOB: [birth year] DOA: 01/04/2015  PCP: No PCP Per Patient  Admit date: 01/04/2015 Discharge date: 01/06/2015  Time spent: >30  minutes  Recommendations for Outpatient Follow-up:  1. Repeat BMET to follow electrolytes and renal function 2. 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 Will discharge back to ALF for further care and treatment. Instructed to follow with PCP in 10 days.  Diet recommendation: low carbohydrates and heart healthy diet  Filed Weights   01/04/15 1109 01/05/15 1500 01/06/15 0517  Weight: 108.863 kg (240 lb) 98.7 kg (217 lb 9.5 oz) 95.7 kg (210 lb 15.7 oz)    History of present illness:  59 y.o. male With history of coronary artery disease, CVA 3 per patient with residual left lower extremity paresis, diabetes mellitus, hypertension, hyperlipidemia, ongoing tobacco use, history of seizure disorder secondary to CVA, wheelchair bound, resident at [facility] who presents to the ED with 1 day history of worsening substernal left-sided chest pain radiating to the left upper extremity and described as a cinder block was placed on his chest. Patient with some associated shortness of breath, diaphoresis. Patient stated the chest pain had been constant. Patient denies any palpitations, no fever, no chills, no nausea, no vomiting, no cough, no melanotic, no hematemesis, no hematochezia, no dysuria, no diet, no constipation, no  abdominal pain, no nausea, no vomiting. Patient was given nitroglycerin via EMS with no significant improvement with his symptoms. Patient was seen in the ED and given IV morphine, sublingual nitroglycerin with no significant relief in his chest pain. EKG showed some borderline ST elevation in the inferior leads. Point-of-care troponin was negative. Patient was also noted to be bradycardic.  Hospital Course:  #1 chest pain/typical angina Patient is CP free and denies SOB -troponin neg X 3 -patient has Echo: with preserved EF, no WMA -Myoview intermediate risk; but ok per cardiology rec's to discharge home. No further inpatient work up recommended -continue ASA, b-blocker (coreg now), ACE and statins  #2 hypertension -Will continue current antihypertensive regimen -BP fairly well controlled  #3 hyperlipidemia -Continue statin and fenofibrate.  #4 seizure disorder secondary to CVA -Stable and no seizure activity appreciated -Continue Keppra for seizure prophylaxis.  #5 tobacco abuse -Tobacco cessation counseling provided.  -Placed on a nicotine patch.  #6 schizoaffective disorder -Stable. No SI or hallucinations -Continue Lamictal, Seroquel.  #7 chronic pain -Continue home pain regimen. -patient will follow up with orthopedic service for further evaluation and potential intervention on his back  #8 history of CVA -Continue aspirin for secondary stroke prevention.  #9 major depressive disorder/anxiety -Continue Cymbalta and Valium.  #10 diabetes mellitus -Will continue home hypoglycemic regimen and low carbohydrates diet -A1C 6.2   #11 GERD -will discharge on PPI  #12 tobacco abuse -cessation counseling provided -discharge on nicoderm  Procedures:  2-D echo: - Left ventricle: The cavity size was normal. Wall thickness was normal. Systolic function was normal. The estimated ejection fraction was in  the range of 60% to 65%. Left ventricular diastolic  function parameters were normal.   Myoview:  1. No reversible ischemia or infarction. 2. Mild hypokinesis involving the septum an anterior lateral wall. 3. Left ventricular ejection fraction 49% 4. Intermediate-risk stress test findings*.  Consultations:  Cardiology   Discharge Exam: Filed Vitals:   01/06/15 1345  BP: 130/74  Pulse: 61  Temp: 98 F (36.7 C)  Resp: 18    General: Afebrile, no CP, no SOB  Cardiovascular: S1 and S2, no rubs or gallops; positive soft SEM  Respiratory: good air movement, no wheezing  Abdomen: soft, NT, ND, positive BS  Musculoskeletal: no edema or cyanosis  Discharge Instructions   Discharge Instructions    Diet - low sodium heart healthy    Complete by:  As directed      Discharge instructions    Complete by:  As directed   Take medications as prescribed Follow low sodium diet Arrange follow up with PCP in 10 days          Current Discharge Medication List    START taking these medications   Details  carvedilol (COREG) 3.125 MG tablet Take 1 tablet (3.125 mg total) by mouth 2 (two) times daily with a meal. Qty: 60 tablet, Refills: 1    nicotine (NICODERM CQ - DOSED IN MG/24 HOURS) 21 mg/24hr patch Place 1 patch (21 mg total) onto the skin daily. Qty: 28 patch, Refills: 0    pantoprazole (PROTONIX) 40 MG tablet Take 1 tablet (40 mg total) by mouth daily at 6 (six) AM. Qty: 30 tablet, Refills: 1      CONTINUE these medications which have NOT CHANGED   Details  amLODipine (NORVASC) 5 MG tablet Take 10 mg by mouth daily.    aspirin 81 MG chewable tablet Chew 81 mg by mouth daily.    atorvastatin (LIPITOR) 20 MG tablet Take 20 mg by mouth daily.    bisacodyl (DULCOLAX) 5 MG EC tablet Take 1 tablet (5 mg total) by mouth daily as needed for moderate constipation. Qty: 30 tablet, Refills: 0    diazepam (VALIUM) 5 MG tablet Take 1 tablet (5 mg total) by mouth every 8 (eight) hours as needed for anxiety. HOLD FOR LETHARGY  OR SEDATION Qty: 20 tablet, Refills: 0    diclofenac sodium (VOLTAREN) 1 % GEL Apply 2 g topically 2 (two) times daily as needed (pain). Apply to lower back, left hip, and left knee    DULoxetine (CYMBALTA) 60 MG capsule Take 60 mg by mouth daily.    febuxostat (ULORIC) 40 MG tablet Take 40 mg by mouth daily.    fenofibrate 160 MG tablet Take 160 mg by mouth daily.    gabapentin (NEURONTIN) 800 MG tablet Take 800 mg by mouth 2 (two) times daily.     lamoTRIgine (LAMICTAL) 25 MG tablet Take 25 mg by mouth daily.     levETIRAcetam (KEPPRA) 500 MG tablet Take 1,000 mg by mouth 2 (two) times daily.    lisinopril (PRINIVIL,ZESTRIL) 10 MG tablet Take 10 mg by mouth daily.    metFORMIN (GLUCOPHAGE) 1000 MG tablet Take 1,000 mg by mouth 2 (two) times daily with a meal.    nitroGLYCERIN (NITROSTAT) 0.4 MG SL tablet Place 0.4 mg under the tongue every 5 (five) minutes as needed for chest pain.    nystatin cream (MYCOSTATIN) Apply 1 application topically 2 (two) times daily as needed (groin redness/itching).    oxybutynin (DITROPAN-XL) 5 MG 24 hr tablet  Take 5 mg by mouth at bedtime.    oxyCODONE-acetaminophen (PERCOCET) 7.5-325 MG per tablet Take 1 tablet by mouth 3 (three) times daily as needed for moderate pain or severe pain.    QUEtiapine (SEROQUEL) 25 MG tablet Take 25 mg by mouth at bedtime.      STOP taking these medications     atenolol (TENORMIN) 50 MG tablet      enoxaparin (LOVENOX) 40 MG/0.4ML injection        No Known Allergies   The results of significant diagnostics from this hospitalization (including imaging, microbiology, ancillary and laboratory) are listed below for reference.    Significant Diagnostic Studies: Dg Chest 2 View  01/04/2015   CLINICAL DATA:  Chest pain with sob since last night  EXAM: CHEST  2 VIEW  COMPARISON:  08/14/2014  FINDINGS: Stable mild to moderate cardiac enlargement. Vascular pattern normal. Lungs clear. No pleural effusions. Moderate  compression deformity mid thoracic spine age uncertain.  IMPRESSION: No acute cardiopulmonary process. Moderate thoracic vertebral body compression deformity of uncertain age.   Electronically Signed   By: Esperanza Heir M.D.   On: 01/04/2015 11:44   Nm Myocar Multi W/spect W/wall Motion / Ef  01/06/2015   CLINICAL DATA:  Chest pain. Cardiac catheterization. History of stroke. Smoker. Diabetes. Hypertension. Coronary artery disease.  EXAM: MYOCARDIAL IMAGING WITH SPECT (REST AND PHARMACOLOGIC-STRESS)  GATED LEFT VENTRICULAR WALL MOTION STUDY  LEFT VENTRICULAR EJECTION FRACTION  TECHNIQUE: Standard myocardial SPECT imaging was performed after resting intravenous injection of 10 mCi Tc-50m sestamibi. Subsequently, intravenous infusion of Lexiscan was performed under the supervision of the Cardiology staff. At peak effect of the drug, 30 mCi Tc-36m sestamibi was injected intravenously and standard myocardial SPECT imaging was performed. Quantitative gated imaging was also performed to evaluate left ventricular wall motion, and estimate left ventricular ejection fraction.  COMPARISON:  None.  FINDINGS: Perfusion: No decreased activity in the left ventricle on stress imaging to suggest reversible ischemia or infarction.  Wall Motion: There is mild hypokinesis involving the mid anterior and inferior septum and distal septum as well as the proximal and mid anterior lateral wall.  Left Ventricular Ejection Fraction: 49 %  End diastolic volume 109 ml  End systolic volume 56 ml  IMPRESSION: 1. No reversible ischemia or infarction.  2. Mild hypokinesis involving the septum an anterior lateral wall.  3. Left ventricular ejection fraction 49%  4. Intermediate-risk stress test findings*.  *2012 Appropriate Use Criteria for Coronary Revascularization Focused Update: J Am Coll Cardiol. 2012;59(9):857-881. http://content.dementiazones.com.aspx?articleid=1201161   Electronically Signed   By: Signa Kell M.D.   On:  01/06/2015 13:01    Labs: Basic Metabolic Panel:  Recent Labs Lab 01/04/15 1138 01/04/15 2040 01/05/15 0300  NA 138  --  140  K 4.1  --  3.5  CL 104  --  106  CO2 25  --  27  GLUCOSE 79  --  87  BUN 8  --  7  CREATININE 0.80  --  0.63  CALCIUM 9.1  --  8.8*  MG  --  1.8  --    Liver Function Tests:  Recent Labs Lab 01/04/15 1138  AST 50*  ALT 47  ALKPHOS 26*  BILITOT 0.2*  PROT 6.5  ALBUMIN 3.8   CBC:  Recent Labs Lab 01/04/15 1138 01/05/15 0300 01/06/15 0405  WBC 7.1 5.5 5.2  HGB 13.5 12.8* 13.3  HCT 41.6 38.7* 40.8  MCV 82.4 82.0 82.3  PLT 213 161 161  Cardiac Enzymes:  Recent Labs Lab 01/04/15 1443 01/04/15 2040 01/05/15 0300  TROPONINI <0.03 <0.03 <0.03   CBG:  Recent Labs Lab 01/05/15 1113 01/05/15 1629 01/05/15 2056 01/06/15 0640 01/06/15 1227  GLUCAP 95 108* 114* 100* 150*    Signed:  Vassie Loll  [facility] 01/06/2015, 3:41 PM

## 2015-01-06 NOTE — Progress Notes (Signed)
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 Still with back pain. Seen in nuc lab for myoview. Tolerated the procedure well.   CURRENT MEDS . amLODipine  10 mg Oral Daily  . aspirin  81 mg Oral Daily  . atorvastatin  20 mg Oral q1800  . carvedilol  3.125 mg Oral BID WC  . DULoxetine  60 mg Oral Daily  . febuxostat  40 mg Oral Daily  . fenofibrate  160 mg Oral Daily  . gabapentin  800 mg Oral BID  . insulin aspart  0-15 Units Subcutaneous TID WC  . lamoTRIgine  25 mg Oral Daily  . levETIRAcetam  1,000 mg Oral BID  . lisinopril  10 mg Oral Daily  . nicotine  21 mg Transdermal Daily  . oxybutynin  5 mg Oral QHS  . pantoprazole  40 mg Oral Q0600  . QUEtiapine  25 mg Oral QHS  . regadenoson      . sodium chloride  3 mL Intravenous Q12H    OBJECTIVE  Filed Vitals:   01/05/15 1500 01/05/15 1530 01/05/15 2035 01/06/15 0517  BP:  162/78 147/90 130/78  Pulse:  66 55 65  Temp:  98 F (36.7 C) 98.2 F (36.8 C) 98.2 F (36.8 C)  TempSrc:  Oral Oral Oral  Resp:  17 18 16   Height:      Weight: 217 lb 9.5 oz (98.7 kg)   210 lb 15.7 oz (95.7 kg)  SpO2:  100% 98% 95%    Intake/Output Summary (Last 24 hours) at 01/06/15 1105 Last data filed at 01/06/15 0645  Gross per 24 hour  Intake    240 ml  Output   2375 ml  Net  -2135 ml   Filed Weights   01/04/15 1109 01/05/15 1500 01/06/15 0517  Weight: 240 lb (108.863 kg) 217 lb 9.5 oz (98.7 kg) 210 lb 15.7 oz (95.7 kg)    PHYSICAL EXAM  General: Pleasant,  NAD. Neuro: Alert and oriented X 3. Moves all extremities spontaneously. Psych: Normal affect. HEENT:  Normal  Neck: Supple without bruits or JVD. Lungs:  Resp regular and unlabored, CTA. Heart: RRR no s3, s4, or murmurs. Abdomen: Soft, non-tender, non-distended, BS + x 4.  Extremities: No clubbing, cyanosis or edema. DP/PT/Radials 2+ and equal bilaterally.  Accessory Clinical Findings  CBC  Recent Labs  01/05/15 0300 01/06/15 0405  WBC 5.5 5.2  HGB 12.8* 13.3  HCT 38.7* 40.8  MCV 82.0 82.3  PLT 161 161   Basic Metabolic Panel  Recent Labs  01/04/15 1138 01/04/15 2040 01/05/15 0300  NA 138  --  140  K 4.1  --  3.5  CL 104  --  106  CO2 25  --  27  GLUCOSE 79  --  87  BUN 8  --  7  CREATININE 0.80  --  0.63  CALCIUM 9.1  --  8.8*  MG  --  1.8  --    Liver  Function Tests  Recent Labs  01/04/15 1138  AST 50*  ALT 47  ALKPHOS 26*  BILITOT 0.2*  PROT 6.5  ALBUMIN 3.8   No results for input(s): LIPASE, AMYLASE in the last 72 hours. Cardiac Enzymes  Recent Labs  01/04/15 1443 01/04/15 2040 01/05/15 0300  TROPONINI <0.03 <0.03 <0.03   BNP Invalid input(s): POCBNP D-Dimer  Recent Labs  01/04/15 1443  DDIMER 0.30   Hemoglobin A1C  Recent Labs  01/04/15 2040  HGBA1C 6.2*   Fasting Lipid Panel  Recent Labs  01/05/15 0300  CHOL 122  HDL 31*  LDLCALC 62  TRIG 865  CHOLHDL 3.9   Thyroid Function Tests  Recent Labs  01/04/15 1443  TSH 0.814    TELE  NSR   Radiology/Studies  Dg Chest 2 View  01/04/2015   CLINICAL DATA:  Chest pain with sob since last night  EXAM: CHEST  2 VIEW  COMPARISON:  08/14/2014  FINDINGS: Stable mild to moderate cardiac enlargement. Vascular pattern normal. Lungs clear. No pleural effusions. Moderate compression deformity mid thoracic spine age uncertain.  IMPRESSION: No acute cardiopulmonary process. Moderate thoracic vertebral body compression deformity of uncertain age.   Electronically Signed   By:  Esperanza Heir M.D.   On: 01/04/2015 11:44   Study Date: 01/05/2015 LV EF: 60% -   65% Study Conclusions - Left ventricle: The cavity size was normal. Wall thickness was normal. Systolic function was normal. The estimated ejection fraction was in the range of 60% to 65%. Left ventricular diastolic function parameters were normal.   ASSESSMENT AND PLAN  1.Chest pain undetermined etiology. He has ruled out by enzymes for acute myocardial infarction.  -- 2D ECHO with normal LVEF and diastolic parameters.  -- He underwent Lexiscan Myoview this morning. Await images.  2. HTN - Stable - Continue home regimen  3. HLD  - Continue statin. Will get lipid panel  4 Tobacco abuse - Advice cessation. Education given  5. MDD (major depressive disorder) - per primary 6. Anxiety - per primary 7. 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 Schizoaffective disorder - per primary 9. DM - per primary. Hold metformin.    Billy Fischer PA-C  Pager [phone] Agree with assessment as noted above.  The patient has completed his Lexi scan Myoview stress test earlier today.  The results are pending.  If there is no evidence of significant reversible ischemia, he will be able to be discharged soon.  He denies any chest pain and he is not experiencing any dyspnea.  His rhythm is normal sinus rhythm.  His lungs are clear and his heart reveals no gallop.

## 2015-01-06 NOTE — Progress Notes (Signed)
CSW (Clinical Child psychotherapist) received call from pt nurse notifying CSW that pt is ready for dc back to facility. CSW unfamiliar with pt but it was confirmed that pt is from [facility]. CSW spoke with Tish at facility and she confirmed pt can return. CSW will prepare FL2 and get MD signature as required by facility.  Terrance Cook Lajean Saver Weekend CSW [phone]

## 2015-01-06 NOTE — Progress Notes (Signed)
ANTICOAGULATION CONSULT NOTE - Follow Up Consult  Pharmacy Consult for Heparin Indication: chest pain/ACS  No Known Allergies  Patient Measurements: Height:  (172.7 cm) Weight: 210 lb 15.7 oz (95.7 kg) IBW/kg (Calculated) : 68.4 Heparin Dosing Weight: 92kg  Vital Signs: Temp: 98.2 F (36.8 C) (09/11 0517) Temp Source: Oral (09/11 0517) BP: 130/78 mmHg (09/11 0517) Pulse Rate: 65 (09/11 0517)  Labs:  Recent Labs  01/04/15 1138 01/04/15 1443  01/04/15 2040 01/05/15 0300 01/05/15 1000 01/06/15 0405  HGB 13.5  --   --   --  12.8*  --  13.3  HCT 41.6  --   --   --  38.7*  --  40.8  PLT 213  --   --   --  161  --  161  LABPROT  --   --   --   --  16.7*  --   --   INR  --   --   --   --  1.34  --   --   HEPARINUNFRC  --   --   < > 0.13* 0.42 0.40 0.34  CREATININE 0.80  --   --   --  0.63  --   --   TROPONINI  --  <0.03  --  <0.03 <0.03  --   --   < > = values in this interval not displayed.  Estimated Creatinine Clearance: 114.3 mL/min (by C-G formula based on Cr of 0.63).   Medications:  Heparin @ 1350 units/hr  Assessment: 59yom continues on heparin for chest pain with negative troponins. ECHO showed EF 60-65% with no WMAs. Heparin level is therapeutic at 0.34. CBC is stable. No bleeding reported. Plan for stress test today.  Goal of Therapy:  Heparin level 0.3-0.7 units/ml Monitor platelets by anticoagulation protocol: Yes   Plan:  1) Continue heparin at 1350 units/hr 2) Follow up heparin level, CBC in AM 3) Follow up after stress test   Fredrik Rigger 01/06/2015,10:34 AM

## 2015-01-07 NOTE — Progress Notes (Signed)
Utilization review completed- completed post discharge 

## 2015-01-21 ENCOUNTER — Encounter (HOSPITAL_COMMUNITY): Payer: Self-pay | Admitting: *Deleted

## 2015-01-21 ENCOUNTER — Emergency Department (HOSPITAL_COMMUNITY)
Admission: EM | Admit: 2015-01-21 | Discharge: 2015-01-21 | Disposition: A | Discharge status: Home / Self Care | Payer: Medicare Other | Attending: Emergency Medicine | Admitting: Emergency Medicine

## 2015-01-21 DIAGNOSIS — Z720 Tobacco use: Secondary | ICD-10-CM | POA: Insufficient documentation

## 2015-01-21 DIAGNOSIS — E785 Hyperlipidemia, unspecified: Secondary | ICD-10-CM | POA: Diagnosis not present

## 2015-01-21 DIAGNOSIS — Z8673 Personal history of transient ischemic attack (TIA), and cerebral infarction without residual deficits: Secondary | ICD-10-CM | POA: Diagnosis not present

## 2015-01-21 DIAGNOSIS — F259 Schizoaffective disorder, unspecified: Secondary | ICD-10-CM | POA: Insufficient documentation

## 2015-01-21 DIAGNOSIS — I2510 Atherosclerotic heart disease of native coronary artery without angina pectoris: Secondary | ICD-10-CM | POA: Diagnosis not present

## 2015-01-21 DIAGNOSIS — Z79899 Other long term (current) drug therapy: Secondary | ICD-10-CM | POA: Insufficient documentation

## 2015-01-21 DIAGNOSIS — F329 Major depressive disorder, single episode, unspecified: Secondary | ICD-10-CM | POA: Diagnosis not present

## 2015-01-21 DIAGNOSIS — R21 Rash and other nonspecific skin eruption: Secondary | ICD-10-CM | POA: Diagnosis present

## 2015-01-21 DIAGNOSIS — L259 Unspecified contact dermatitis, unspecified cause: Secondary | ICD-10-CM | POA: Diagnosis not present

## 2015-01-21 DIAGNOSIS — I10 Essential (primary) hypertension: Secondary | ICD-10-CM | POA: Diagnosis not present

## 2015-01-21 DIAGNOSIS — G40909 Epilepsy, unspecified, not intractable, without status epilepticus: Secondary | ICD-10-CM | POA: Insufficient documentation

## 2015-01-21 DIAGNOSIS — Z7952 Long term (current) use of systemic steroids: Secondary | ICD-10-CM | POA: Insufficient documentation

## 2015-01-21 DIAGNOSIS — F419 Anxiety disorder, unspecified: Secondary | ICD-10-CM | POA: Diagnosis not present

## 2015-01-21 DIAGNOSIS — Z9889 Other specified postprocedural states: Secondary | ICD-10-CM | POA: Insufficient documentation

## 2015-01-21 DIAGNOSIS — G8929 Other chronic pain: Secondary | ICD-10-CM | POA: Diagnosis not present

## 2015-01-21 DIAGNOSIS — Z7982 Long term (current) use of aspirin: Secondary | ICD-10-CM | POA: Insufficient documentation

## 2015-01-21 DIAGNOSIS — E119 Type 2 diabetes mellitus without complications: Secondary | ICD-10-CM | POA: Diagnosis not present

## 2015-01-21 DIAGNOSIS — N508 Other specified disorders of male genital organs: Secondary | ICD-10-CM | POA: Diagnosis not present

## 2015-01-21 DIAGNOSIS — L309 Dermatitis, unspecified: Secondary | ICD-10-CM

## 2015-01-21 DIAGNOSIS — N5089 Other specified disorders of the male genital organs: Secondary | ICD-10-CM

## 2015-01-21 MED ORDER — PREDNISONE 20 MG PO TABS
60.0000 mg | ORAL_TABLET | Freq: Once | ORAL | Status: AC
Start: 2015-01-21 — End: 2015-01-21
  Administered 2015-01-21: 60 mg via ORAL
  Filled 2015-01-21: qty 3

## 2015-01-21 MED ORDER — PREDNISONE 10 MG (21) PO TBPK: 10.0000 mg | ORAL_TABLET | Freq: Every day | ORAL | Status: DC

## 2015-01-21 MED ORDER — DIPHENHYDRAMINE HCL 25 MG PO CAPS
50.0000 mg | ORAL_CAPSULE | Freq: Once | ORAL | Status: AC
  Administered 2015-01-21: 50 mg via ORAL
  Filled 2015-01-21: qty 2

## 2015-01-21 MED ORDER — DIPHENHYDRAMINE HCL 25 MG PO TABS: 25.0000 mg | ORAL_TABLET | Freq: Four times a day (QID) | ORAL | Status: DC

## 2015-01-21 NOTE — ED Notes (Signed)
PTAR at bedside 

## 2015-01-21 NOTE — ED Notes (Signed)
Pt to ED from [facility] assisted living via [facility] c/o rash to groin area. EMS VS 144/100 HR 66 Resp 18

## 2015-01-21 NOTE — ED Provider Notes (Signed)
CSN: [id number]     Arrival date & time 01/21/15  0330 History   First MD Initiated Contact with Patient 01/21/15 0404     Chief Complaint  Patient presents with  . Rash     HPI Patient reports burning sensation to his bilateral groin and scrotal region of the past week.  He is currently at assisted living center is being managed by the team of providers there.  They're currently treating him for possible candidal infection.  He is also using calmosine cream without improvement.  He denies fevers and chills.  He states the itching sensation is kept him up and not allowed him to sleep.  He reports "it's driving me crazy".  No other complaints.  He is a diabetic. Past Medical History  Diagnosis Date  . Stroke   . Coronary artery disease   . History of cardiac cath   . Diabetes mellitus without complication   . Hypertension   . Hyperlipidemia 01/04/2015  . Tobacco use 01/04/2015  . MDD (major depressive disorder) 01/04/2015  . Anxiety 01/04/2015  . 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 Schizoaffective disorder 01/04/2015  . H/O: CVA (cerebrovascular accident) 01/04/2015  . Chronic pain 01/04/2015  . CAD (coronary artery disease) 01/04/2015   Past Surgical History  Procedure Laterality Date  . Cardiac surgery    . Femur fracture surgery    . Femur im nail Left 08/15/2014    Procedure: Operative Fixation left periprosthetic femur fracture;  Surgeon: Tarry Kos, MD;  Location: [facility] OR;  Service: Orthopedics;  Laterality: Left;  . Hardware removal Left 08/15/2014    Procedure: HARDWARE REMOVAL;  Surgeon: Tarry Kos, MD;  Location: [facility] OR;  Service: Orthopedics;  Laterality: Left;   Family History  Problem Relation Age of Onset  . Heart attack Mother    Social History  Substance Use Topics  . Smoking status: Current Every Day Smoker -- 0.50 packs/day for 45 years    Types: Cigarettes  . Smokeless tobacco: Never Used  . Alcohol Use: No    Review of Systems  All other systems reviewed  and are negative.     Allergies  Review of patient's allergies indicates no known allergies.  Home Medications   Prior to Admission medications   Medication Sig Start Date End Date Taking? Authorizing Provider  amLODipine (NORVASC) 5 MG tablet Take 10 mg by mouth daily.    Historical Provider, MD  aspirin 81 MG chewable tablet Chew 81 mg by mouth daily.    Historical Provider, MD  atorvastatin (LIPITOR) 20 MG tablet Take 20 mg by mouth daily.    Historical Provider, MD  bisacodyl (DULCOLAX) 5 MG EC tablet Take 1 tablet (5 mg total) by mouth daily as needed for moderate constipation. 08/17/14   Rodolph Bong, MD  carvedilol (COREG) 3.125 MG tablet Take 1 tablet (3.125 mg total) by mouth 2 (two) times daily with a meal. 01/06/15   Vassie Loll, MD  diazepam (VALIUM) 5 MG tablet Take 1 tablet (5 mg total) by mouth every 8 (eight) hours as needed for anxiety. HOLD FOR LETHARGY OR SEDATION 08/17/14   Rodolph Bong, MD  diclofenac sodium (VOLTAREN) 1 % GEL Apply 2 g topically 2 (two) times daily as needed (pain). Apply to lower back, left hip, and left knee    Historical Provider, MD  diphenhydrAMINE (BENADRYL) 25 MG tablet Take 1 tablet (25 mg total) by mouth every 6 (six) hours. 01/21/15   Caryn Bee  Campos, MD  DULoxetine (CYMBALTA) 60 MG capsule Take 60 mg by mouth daily.    Historical Provider, MD  febuxostat (ULORIC) 40 MG tablet Take 40 mg by mouth daily.    Historical Provider, MD  fenofibrate 160 MG tablet Take 160 mg by mouth daily.    Historical Provider, MD  gabapentin (NEURONTIN) 800 MG tablet Take 800 mg by mouth 2 (two) times daily.     Historical Provider, MD  lamoTRIgine (LAMICTAL) 25 MG tablet Take 25 mg by mouth daily.     Historical Provider, MD  levETIRAcetam (KEPPRA) 500 MG tablet Take 1,000 mg by mouth 2 (two) times daily.    Historical Provider, MD  lisinopril (PRINIVIL,ZESTRIL) 10 MG tablet Take 10 mg by mouth daily.    Historical Provider, MD  metFORMIN (GLUCOPHAGE)  1000 MG tablet Take 1,000 mg by mouth 2 (two) times daily with a meal.    Historical Provider, MD  nicotine (NICODERM CQ - DOSED IN MG/24 HOURS) 21 mg/24hr patch Place 1 patch (21 mg total) onto the skin daily. 01/06/15   Vassie Loll, MD  nitroGLYCERIN (NITROSTAT) 0.4 MG SL tablet Place 0.4 mg under the tongue every 5 (five) minutes as needed for chest pain.    Historical Provider, MD  nystatin cream (MYCOSTATIN) Apply 1 application topically 2 (two) times daily as needed (groin redness/itching).    Historical Provider, MD  oxybutynin (DITROPAN-XL) 5 MG 24 hr tablet Take 5 mg by mouth at bedtime.    Historical Provider, MD  oxyCODONE-acetaminophen (PERCOCET) 7.5-325 MG per tablet Take 1 tablet by mouth 3 (three) times daily as needed for moderate pain or severe pain.    Historical Provider, MD  pantoprazole (PROTONIX) 40 MG tablet Take 1 tablet (40 mg total) by mouth daily at 6 (six) AM. 01/06/15   Vassie Loll, MD  predniSONE (STERAPRED UNI-PAK 21 TAB) 10 MG (21) TBPK tablet Take 1 tablet (10 mg total) by mouth daily. Take 6 tabs by mouth daily  for 2 days, then 5 tabs for 2 days, then 4 tabs for 2 days, then 3 tabs for 2 days, 2 tabs for 2 days, then 1 tab by mouth daily for 2 days 01/21/15   Azalia Bilis, MD  QUEtiapine (SEROQUEL) 25 MG tablet Take 25 mg by mouth at bedtime.    Historical Provider, MD   BP 156/87 mmHg  Pulse 53  Temp(Src) 98.9 F (37.2 C) (Oral)  Resp 16  Ht  (1.702 m)  Wt 207 lb (93.895 kg)  BMI 32.41 kg/m2  SpO2 96% Physical Exam  Constitutional: He is oriented to person, place, and time. He appears well-developed and well-nourished.  HENT:  Head: Normocephalic.  Eyes: EOM are normal.  Neck: Normal range of motion.  Pulmonary/Chest: Effort normal.  Abdominal: He exhibits no distension.  Genitourinary:  Papular rash with multiple confluent areas of his bilateral groins and scrotum.  No warmth or erythema.  No peroneal crepitus.  No abscess present.  No drainage.   Musculoskeletal: Normal range of motion.  Neurological: He is alert and oriented to person, place, and time.  Psychiatric: He has a normal mood and affect.  Nursing note and vitals reviewed.   ED Course  Procedures (including critical care time) Labs Review Labs Reviewed - No data to display  Imaging Review No results found. I have personally reviewed and evaluated these images and lab results as part of my medical decision-making.   EKG Interpretation None      MDM  Final diagnoses:  Scrotal irritation  Dermatitis    His rash appears more consistent with a dermatitis.  Nothing to suggest cellulitis or Fournier's gangrene.   Patient will be started on a steroid taper as well as Benadryl.  He does understand that as a diabetic prednisone likely will elevate his blood sugar.  He tells me he will keep a close eye on his blood sugar the next 10 days.  I've asked that he continue to follow-up with the primary medical team at his facility.  He understands to return to the ER for new or worsening symptoms    Azalia Bilis, MD 01/21/15 0500

## 2015-01-21 NOTE — ED Notes (Signed)
[facility] called for transport.  

## 2015-01-21 NOTE — ED Notes (Signed)
Pt c/o rash to bilateral groin and scrotum x 1 week. Reports use calmosine and nysatin without relief. Reports burning sensation to area.

## 2015-02-03 ENCOUNTER — Emergency Department (HOSPITAL_COMMUNITY)
Admission: EM | Admit: 2015-02-03 | Discharge: 2015-02-03 | Disposition: A | Discharge status: Home / Self Care | Payer: Medicare Other | Attending: Emergency Medicine | Admitting: Emergency Medicine

## 2015-02-03 ENCOUNTER — Encounter (HOSPITAL_COMMUNITY): Payer: Self-pay

## 2015-02-03 DIAGNOSIS — Z79899 Other long term (current) drug therapy: Secondary | ICD-10-CM | POA: Diagnosis not present

## 2015-02-03 DIAGNOSIS — E785 Hyperlipidemia, unspecified: Secondary | ICD-10-CM | POA: Diagnosis not present

## 2015-02-03 DIAGNOSIS — Y939 Activity, unspecified: Secondary | ICD-10-CM | POA: Diagnosis not present

## 2015-02-03 DIAGNOSIS — Y92009 Unspecified place in unspecified non-institutional (private) residence as the place of occurrence of the external cause: Secondary | ICD-10-CM | POA: Insufficient documentation

## 2015-02-03 DIAGNOSIS — T63331A Toxic effect of venom of brown recluse spider, accidental (unintentional), initial encounter: Secondary | ICD-10-CM | POA: Diagnosis not present

## 2015-02-03 DIAGNOSIS — Z7982 Long term (current) use of aspirin: Secondary | ICD-10-CM | POA: Diagnosis not present

## 2015-02-03 DIAGNOSIS — T63301A Toxic effect of unspecified spider venom, accidental (unintentional), initial encounter: Secondary | ICD-10-CM

## 2015-02-03 DIAGNOSIS — F259 Schizoaffective disorder, unspecified: Secondary | ICD-10-CM | POA: Insufficient documentation

## 2015-02-03 DIAGNOSIS — Z7952 Long term (current) use of systemic steroids: Secondary | ICD-10-CM | POA: Diagnosis not present

## 2015-02-03 DIAGNOSIS — G40909 Epilepsy, unspecified, not intractable, without status epilepticus: Secondary | ICD-10-CM | POA: Diagnosis not present

## 2015-02-03 DIAGNOSIS — F419 Anxiety disorder, unspecified: Secondary | ICD-10-CM | POA: Insufficient documentation

## 2015-02-03 DIAGNOSIS — Y999 Unspecified external cause status: Secondary | ICD-10-CM | POA: Diagnosis not present

## 2015-02-03 DIAGNOSIS — I2510 Atherosclerotic heart disease of native coronary artery without angina pectoris: Secondary | ICD-10-CM | POA: Insufficient documentation

## 2015-02-03 DIAGNOSIS — E119 Type 2 diabetes mellitus without complications: Secondary | ICD-10-CM | POA: Insufficient documentation

## 2015-02-03 DIAGNOSIS — Z8673 Personal history of transient ischemic attack (TIA), and cerebral infarction without residual deficits: Secondary | ICD-10-CM | POA: Diagnosis not present

## 2015-02-03 DIAGNOSIS — I10 Essential (primary) hypertension: Secondary | ICD-10-CM | POA: Insufficient documentation

## 2015-02-03 DIAGNOSIS — Z23 Encounter for immunization: Secondary | ICD-10-CM | POA: Diagnosis not present

## 2015-02-03 DIAGNOSIS — Y929 Unspecified place or not applicable: Secondary | ICD-10-CM | POA: Insufficient documentation

## 2015-02-03 DIAGNOSIS — G8929 Other chronic pain: Secondary | ICD-10-CM | POA: Diagnosis not present

## 2015-02-03 DIAGNOSIS — L0291 Cutaneous abscess, unspecified: Secondary | ICD-10-CM

## 2015-02-03 DIAGNOSIS — Z9889 Other specified postprocedural states: Secondary | ICD-10-CM | POA: Insufficient documentation

## 2015-02-03 DIAGNOSIS — L02413 Cutaneous abscess of right upper limb: Secondary | ICD-10-CM | POA: Diagnosis not present

## 2015-02-03 DIAGNOSIS — F329 Major depressive disorder, single episode, unspecified: Secondary | ICD-10-CM | POA: Insufficient documentation

## 2015-02-03 MED ORDER — LIDOCAINE-EPINEPHRINE (PF) 2 %-1:200000 IJ SOLN
10.0000 mL | Freq: Once | INTRAMUSCULAR | Status: AC
  Administered 2015-02-03: 10 mL
  Filled 2015-02-03: qty 20

## 2015-02-03 MED ORDER — OXYCODONE-ACETAMINOPHEN 5-325 MG PO TABS: 1.0000 | ORAL_TABLET | Freq: Four times a day (QID) | ORAL | Status: DC | PRN

## 2015-02-03 MED ORDER — OXYCODONE-ACETAMINOPHEN 5-325 MG PO TABS
1.0000 | ORAL_TABLET | Freq: Once | ORAL | Status: AC
  Administered 2015-02-03: 1 via ORAL
  Filled 2015-02-03: qty 1

## 2015-02-03 MED ORDER — TETANUS-DIPHTH-ACELL PERTUSSIS 5-2.5-18.5 LF-MCG/0.5 IM SUSP
0.5000 mL | Freq: Once | INTRAMUSCULAR | Status: AC
  Administered 2015-02-03: 0.5 mL via INTRAMUSCULAR
  Filled 2015-02-03: qty 0.5

## 2015-02-03 NOTE — ED Notes (Signed)
Called for [facility] and [facility] Ida verbalized understanding.

## 2015-02-03 NOTE — Discharge Instructions (Signed)
Keep wound clean and dry. Apply warm compresses to affected area throughout the day. Take your home doxycycline until it is finished. Take percocet as directed, as needed for pain but do not drive or operate machinery with pain medication use. Followup with [facility]/Primary Care doctor/assisted living facility provider in 2 days for wound recheck. Monitor area for signs of infection to include, but not limited to: increasing pain, spreading redness, drainage/pus, worsening swelling, or fevers. Return to emergency department for emergent changing or worsening symptoms.   Abscess An abscess (boil or furuncle) is an infected area on or under the skin. This area is filled with yellowish-white fluid (pus) and other material (debris). HOME CARE   Only take medicines as told by your doctor.  If you were given antibiotic medicine, take it as directed. Finish the medicine even if you start to feel better.  If gauze is used, follow your doctor's directions for changing the gauze.  To avoid spreading the infection:  Keep your abscess covered with a bandage.  Wash your hands well.  Do not share personal care items, towels, or whirlpools with others.  Avoid skin contact with others.  Keep your skin and clothes clean around the abscess.  Keep all doctor visits as told. GET HELP RIGHT AWAY IF:   You have more pain, puffiness (swelling), or redness in the wound site.  You have more fluid or blood coming from the wound site.  You have muscle aches, chills, or you feel sick.  You have a fever. MAKE SURE YOU:   Understand these instructions.  Will watch your condition.  Will get help right away if you are not doing well or get worse.   This information is not intended to replace advice given to you by your health care provider. Make sure you discuss any questions you have with your health care provider.   Document Released: 09/30/2007 Document Revised: 10/13/2011 Document  Reviewed: 06/27/2011 Elsevier Interactive Patient Education 2016 [employer].  Spider Bite Spider bites are not common. Most spider bites do not cause serious problems. There are only a few types of spider bites that can cause serious health problems. HOME CARE Medicine  Take or apply over-the-counter and prescription medicines only as told by your doctor.  If you were given an antibiotic medicine, take or apply it as told by your doctor. Do not stop using the antibiotic even if your condition improves. General Instructions  Do not scratch the bite area.  Keep the bite area clean and dry. Wash the bite area with soap and water every day as told by your doctor.  If directed, apply ice to the bite area.  Put ice in a plastic bag.  Place a towel between your skin and the bag.  Leave the ice on for 20 minutes, 2-3 times per day.  Raise (elevate) the affected area above the level of your heart while you are sitting or lying down, if this is possible.  Keep all follow-up visits as told by your doctor. This is important. GET HELP IF:  Your bite does not get better after 3 days.  Your bite turns black or purple.  Near the bite, you have:  Redness.  Swelling (inflammation).  Pain that is getting worse. GET HELP RIGHT AWAY IF:  You get shortness of breath or chest pain.  You have fluid, blood, or pus coming from the bite area.  You have muscle cramps or painful muscle spasms.  You have stomach (  abdominal) pain.  You feel sick to your stomach (nauseous) or you throw up (vomit).  You feel more tired or sleepy than you normally do.   This information is not intended to replace advice given to you by your health care provider. Make sure you discuss any questions you have with your health care provider.   Document Released: 05/16/2010 Document Revised: 01/02/2015 Document Reviewed: 08/29/2014 Elsevier Interactive Patient Education [employer]

## 2015-02-03 NOTE — ED Provider Notes (Signed)
CSN: [id number]     Arrival date & time 02/03/15  1433 History   First MD Initiated Contact with Patient 02/03/15 1443     Chief Complaint  Patient presents with  . Insect Bite     (Consider location/radiation/quality/duration/timing/severity/associated sxs/prior Treatment) HPI Comments: Terrance Cook is a 59 y.o. male with a PMHx of CVA, CAD, DM2, HTN, HLD, tobacco abuse, MDD, anxiety, seizure d/o secondary to CVA, chronic pain, and schizoaffective disorder who lives in an assisted living facility, who presents to the ED with complaints of spider bite to his right wrist that occurred on 01/23/15. He reports he gradually developed what looked to be like a pimple and worsened to the point of becoming a large abscess in this area. He was started on doxycycline yesterday but was sent here today from his assisted living facility for further management of this abscess. He describes the pain to this area as being 10/10 constant throbbing nonradiating worse with palpation and unrelieved with Tylenol. Associated symptoms include erythema, warmth, and gradually worsening swelling of the abscess. He denies any red streaking, drainage, fevers, chills, chest pain, shortness breath, abdominal pain, nausea, vomiting, diarrhea, constipation, dysuria, hematuria, or new numbness tingling or weakness (has residual numbness and weakness in L side from his prior strokes). He is unsure of his last tetanus shot but believes that it was more than 10 years ago. He states that his blood sugars have been running in the low 100s recently. He no longer takes percocet at home for pain, he recently got off of this, and he can't take ibuprofen.   Patient is a 59 y.o. male presenting with animal bite. The history is provided by the patient and medical records. No language interpreter was used.  Animal Bite Contact animal:  Insect Location:  Shoulder/arm Shoulder/arm injury location:  R wrist Time since incident:  2 weeks Pain  details:    Quality: throbbing.   Severity:  Severe   Timing:  Constant   Progression:  Worsening Incident location:  Home Provoked: unprovoked   Notifications:  None Animal in possession: yes (killed spider and taped it to a piece of paper)   Tetanus status:  Unknown Relieved by:  Nothing Exacerbated by: pressure to area. Ineffective treatments:  OTC medications (tylenol) Associated symptoms: swelling   Associated symptoms: no fever and no numbness (nothing new)     Past Medical History  Diagnosis Date  . Stroke (HCC)   . Coronary artery disease   . History of cardiac cath   . Diabetes mellitus without complication (HCC)   . Hypertension   . Hyperlipidemia 01/04/2015  . Tobacco use 01/04/2015  . MDD (major depressive disorder) (HCC) 01/04/2015  . Anxiety 01/04/2015  . 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 Schizoaffective disorder (HCC) 01/04/2015  . H/O: CVA (cerebrovascular accident) 01/04/2015  . Chronic pain 01/04/2015  . CAD (coronary artery disease) 01/04/2015   Past Surgical History  Procedure Laterality Date  . Cardiac surgery    . Femur fracture surgery    . Femur im nail Left 08/15/2014    Procedure: Operative Fixation left periprosthetic femur fracture;  Surgeon: Tarry Kos, MD;  Location: [facility] OR;  Service: Orthopedics;  Laterality: Left;  . Hardware removal Left 08/15/2014    Procedure: HARDWARE REMOVAL;  Surgeon: Tarry Kos, MD;  Location: [facility] OR;  Service: Orthopedics;  Laterality: Left;   Family History  Problem Relation Age of Onset  . Heart attack Mother    Social  History  Substance Use Topics  . Smoking status: Current Every Day Smoker -- 0.50 packs/day for 45 years    Types: Cigarettes  . Smokeless tobacco: Never Used  . Alcohol Use: No    Review of Systems  Constitutional: Negative for fever and chills.  Respiratory: Negative for shortness of breath.   Cardiovascular: Negative for chest pain.  Gastrointestinal: Negative for nausea, vomiting,  abdominal pain, diarrhea and constipation.  Genitourinary: Negative for dysuria and hematuria.  Musculoskeletal: Positive for myalgias (R wrist at site of abscess ). Negative for joint swelling and arthralgias.  Skin: Positive for color change and wound (abscess).  Allergic/Immunologic: Positive for immunocompromised state (diabetic).  Neurological: Negative for weakness (nothing new) and numbness (nothing new).  Psychiatric/Behavioral: Negative for confusion.   10 Systems reviewed and are negative for acute change except as noted in the HPI.    Allergies  Review of patient's allergies indicates no known allergies.  Home Medications   Prior to Admission medications   Medication Sig Start Date End Date Taking? Authorizing Provider  amLODipine (NORVASC) 5 MG tablet Take 10 mg by mouth daily.    Historical Provider, MD  aspirin 81 MG chewable tablet Chew 81 mg by mouth daily.    Historical Provider, MD  atorvastatin (LIPITOR) 20 MG tablet Take 20 mg by mouth daily.    Historical Provider, MD  bisacodyl (DULCOLAX) 5 MG EC tablet Take 1 tablet (5 mg total) by mouth daily as needed for moderate constipation. 08/17/14   Rodolph Bong, MD  carvedilol (COREG) 3.125 MG tablet Take 1 tablet (3.125 mg total) by mouth 2 (two) times daily with a meal. 01/06/15   Vassie Loll, MD  diazepam (VALIUM) 5 MG tablet Take 1 tablet (5 mg total) by mouth every 8 (eight) hours as needed for anxiety. HOLD FOR LETHARGY OR SEDATION 08/17/14   Rodolph Bong, MD  diclofenac sodium (VOLTAREN) 1 % GEL Apply 2 g topically 2 (two) times daily as needed (pain). Apply to lower back, left hip, and left knee    Historical Provider, MD  diphenhydrAMINE (BENADRYL) 25 MG tablet Take 1 tablet (25 mg total) by mouth every 6 (six) hours. 01/21/15   Azalia Bilis, MD  DULoxetine (CYMBALTA) 60 MG capsule Take 60 mg by mouth daily.    Historical Provider, MD  febuxostat (ULORIC) 40 MG tablet Take 40 mg by mouth daily.    Historical  Provider, MD  fenofibrate 160 MG tablet Take 160 mg by mouth daily.    Historical Provider, MD  gabapentin (NEURONTIN) 800 MG tablet Take 800 mg by mouth 2 (two) times daily.     Historical Provider, MD  lamoTRIgine (LAMICTAL) 25 MG tablet Take 25 mg by mouth daily.     Historical Provider, MD  levETIRAcetam (KEPPRA) 500 MG tablet Take 1,000 mg by mouth 2 (two) times daily.    Historical Provider, MD  lisinopril (PRINIVIL,ZESTRIL) 10 MG tablet Take 10 mg by mouth daily.    Historical Provider, MD  metFORMIN (GLUCOPHAGE) 1000 MG tablet Take 1,000 mg by mouth 2 (two) times daily with a meal.    Historical Provider, MD  nicotine (NICODERM CQ - DOSED IN MG/24 HOURS) 21 mg/24hr patch Place 1 patch (21 mg total) onto the skin daily. 01/06/15   Vassie Loll, MD  nitroGLYCERIN (NITROSTAT) 0.4 MG SL tablet Place 0.4 mg under the tongue every 5 (five) minutes as needed for chest pain.    Historical Provider, MD  nystatin cream (MYCOSTATIN) Apply  1 application topically 2 (two) times daily as needed (groin redness/itching).    Historical Provider, MD  oxybutynin (DITROPAN-XL) 5 MG 24 hr tablet Take 5 mg by mouth at bedtime.    Historical Provider, MD  oxyCODONE-acetaminophen (PERCOCET) 7.5-325 MG per tablet Take 1 tablet by mouth 3 (three) times daily as needed for moderate pain or severe pain.    Historical Provider, MD  pantoprazole (PROTONIX) 40 MG tablet Take 1 tablet (40 mg total) by mouth daily at 6 (six) AM. 01/06/15   Vassie Loll, MD  predniSONE (STERAPRED UNI-PAK 21 TAB) 10 MG (21) TBPK tablet Take 1 tablet (10 mg total) by mouth daily. Take 6 tabs by mouth daily  for 2 days, then 5 tabs for 2 days, then 4 tabs for 2 days, then 3 tabs for 2 days, 2 tabs for 2 days, then 1 tab by mouth daily for 2 days 01/21/15   Azalia Bilis, MD  QUEtiapine (SEROQUEL) 25 MG tablet Take 25 mg by mouth at bedtime.    Historical Provider, MD   BP 152/83 mmHg  Pulse 73  Temp(Src) 98.8 F (37.1 C) (Oral)  Resp 18  SpO2  95% Physical Exam  Constitutional: He is oriented to person, place, and time. Vital signs are normal. He appears well-developed and well-nourished.  Non-toxic appearance. No distress.  Afebrile, nontoxic, NAD  HENT:  Head: Normocephalic and atraumatic.  Mouth/Throat: Oropharynx is clear and moist and mucous membranes are normal.  Eyes: Conjunctivae and EOM are normal. Right eye exhibits no discharge. Left eye exhibits no discharge.  Neck: Normal range of motion. Neck supple.  Cardiovascular: Normal rate, regular rhythm, normal heart sounds and intact distal pulses.  Exam reveals no gallop and no friction rub.   No murmur heard. Pulmonary/Chest: Effort normal and breath sounds normal. No respiratory distress. He has no decreased breath sounds. He has no wheezes. He has no rhonchi. He has no rales.  Abdominal: Soft. Normal appearance and bowel sounds are normal. He exhibits no distension. There is no tenderness. There is no rigidity, no rebound and no guarding.  Musculoskeletal: Normal range of motion.       Right wrist: He exhibits tenderness and swelling (abscess). He exhibits normal range of motion and no bony tenderness.       Arms: R wrist with FROM intact, no focal bony TTP but with TTP over a ~2cm fluctuant abscess to the ulnar aspect of the volar side of the wrist, minimal surrounding erythema with warmth, no red streaking, strength and sensation grossly intact, distal pulses intact, soft compartments.  Neurological: He is alert and oriented to person, place, and time. He has normal strength. No sensory deficit.  Skin: Skin is warm, dry and intact. No rash noted. There is erythema.  Abscess and erythema to R wrist as discussed above  Psychiatric: He has a normal mood and affect.  Nursing note and vitals reviewed.   ED Course  .Marland KitchenIncision and Drainage Date/Time: 02/03/2015 3:58 PM Performed by: Marjean Donna Korin Hartwell Authorized by: Allen Derry Consent: Verbal consent  obtained. Risks and benefits: risks, benefits and alternatives were discussed Consent given by: patient Patient understanding: patient states understanding of the procedure being performed Patient consent: the patient's understanding of the procedure matches consent given Patient identity confirmed: verbally with patient Type: abscess Body area: upper extremity Location details: right wrist Anesthesia: local infiltration Local anesthetic: lidocaine 2% with epinephrine Anesthetic total: 2 ml Patient sedated: no Scalpel size: 11 Needle gauge: 25. Incision type: single straight  Incision depth: subcutaneous Complexity: complex (blunt dissection) Drainage: purulent Drainage amount: moderate Wound treatment: wound left open Packing material: none Patient tolerance: Patient tolerated the procedure well with no immediate complications   (including critical care time) Labs Review Labs Reviewed - No data to display  Imaging Review No results found. I have personally reviewed and evaluated these images and lab results as part of my medical decision-making.   EKG Interpretation None      MDM   Final diagnoses:  Abscess  Spider bite, accidental or unintentional, initial encounter    59 y.o. male here with R wrist abscess and early cellulitis, ongoing x1.5wks, gradually worsening. Started doxycycline yesterday. On exam, mild erythema surrounding a fluctuant abscess approx 2cm in diameter, warm to touch, no red streaking. NVI with soft compartments. Will update tetanus and I&D. Will give pain meds then reassess.  3:58 PM I&D performed with purulent drainage noted. Wound left open, discussed warm compresses and wound check in 2 days by PCP or the provider at assisted living center. Will give pain meds for home. Discussed continuation of doxycycline. I explained the diagnosis and have given explicit precautions to return to the ER including for any other new or worsening symptoms. The  patient understands and accepts the medical plan as it's been dictated and I have answered their questions. Discharge instructions concerning home care and prescriptions have been given. The patient is STABLE and is discharged to home in good condition.  BP 129/87 mmHg  Pulse 71  Temp(Src) 98.8 F (37.1 C) (Oral)  Resp 18  SpO2 98%  Meds ordered this encounter  Medications  . Tdap (BOOSTRIX) injection 0.5 mL    Sig:   . lidocaine-EPINEPHrine (XYLOCAINE W/EPI) 2 %-1:200000 (PF) injection 10 mL    Sig:   . oxyCODONE-acetaminophen (PERCOCET/ROXICET) 5-325 MG per tablet 1 tablet    Sig:   . oxyCODONE-acetaminophen (PERCOCET) 5-325 MG tablet    Sig: Take 1 tablet by mouth every 6 (six) hours as needed for severe pain.    Dispense:  10 tablet    Refill:  0    Order Specific Question:  Supervising Provider    Answer:  Eber Hong [[id number]]     Celenia Hruska Camprubi-Soms, PA-C 02/03/15 1605  Marily Memos, MD 02/06/15 2201

## 2015-02-03 NOTE — ED Notes (Signed)
To room via EMS.  Onset 01-23-15 pt was bit by brown recluse spider.  He lives at [facility] assisted living.  Was started on Doxycycline yesterday.  EMS BP 176/102 JHR 80 CBG 95

## 2015-06-03 ENCOUNTER — Emergency Department (HOSPITAL_COMMUNITY)
Admission: EM | Admit: 2015-06-03 | Discharge: 2015-06-04 | Disposition: A | Discharge status: Home / Self Care | Payer: Medicare Other | Attending: Emergency Medicine | Admitting: Emergency Medicine

## 2015-06-03 ENCOUNTER — Emergency Department (HOSPITAL_COMMUNITY): Payer: Medicare Other

## 2015-06-03 ENCOUNTER — Encounter (HOSPITAL_COMMUNITY): Payer: Self-pay | Admitting: Emergency Medicine

## 2015-06-03 DIAGNOSIS — Z791 Long term (current) use of non-steroidal anti-inflammatories (NSAID): Secondary | ICD-10-CM | POA: Insufficient documentation

## 2015-06-03 DIAGNOSIS — I10 Essential (primary) hypertension: Secondary | ICD-10-CM | POA: Diagnosis not present

## 2015-06-03 DIAGNOSIS — Z7982 Long term (current) use of aspirin: Secondary | ICD-10-CM | POA: Insufficient documentation

## 2015-06-03 DIAGNOSIS — I2510 Atherosclerotic heart disease of native coronary artery without angina pectoris: Secondary | ICD-10-CM | POA: Diagnosis not present

## 2015-06-03 DIAGNOSIS — Z79899 Other long term (current) drug therapy: Secondary | ICD-10-CM | POA: Diagnosis not present

## 2015-06-03 DIAGNOSIS — F419 Anxiety disorder, unspecified: Secondary | ICD-10-CM | POA: Diagnosis not present

## 2015-06-03 DIAGNOSIS — Z7984 Long term (current) use of oral hypoglycemic drugs: Secondary | ICD-10-CM | POA: Insufficient documentation

## 2015-06-03 DIAGNOSIS — E119 Type 2 diabetes mellitus without complications: Secondary | ICD-10-CM | POA: Diagnosis not present

## 2015-06-03 DIAGNOSIS — G8929 Other chronic pain: Secondary | ICD-10-CM | POA: Insufficient documentation

## 2015-06-03 DIAGNOSIS — F17210 Nicotine dependence, cigarettes, uncomplicated: Secondary | ICD-10-CM | POA: Diagnosis not present

## 2015-06-03 DIAGNOSIS — E785 Hyperlipidemia, unspecified: Secondary | ICD-10-CM | POA: Diagnosis not present

## 2015-06-03 DIAGNOSIS — Z8673 Personal history of transient ischemic attack (TIA), and cerebral infarction without residual deficits: Secondary | ICD-10-CM | POA: Insufficient documentation

## 2015-06-03 DIAGNOSIS — R109 Unspecified abdominal pain: Secondary | ICD-10-CM | POA: Diagnosis present

## 2015-06-03 DIAGNOSIS — M25512 Pain in left shoulder: Secondary | ICD-10-CM | POA: Insufficient documentation

## 2015-06-03 DIAGNOSIS — M898X1 Other specified disorders of bone, shoulder: Secondary | ICD-10-CM

## 2015-06-03 LAB — URINALYSIS, ROUTINE W REFLEX MICROSCOPIC
BILIRUBIN URINE: NEGATIVE
Glucose, UA: NEGATIVE mg/dL
Hgb urine dipstick: NEGATIVE
KETONES UR: NEGATIVE mg/dL
LEUKOCYTES UA: NEGATIVE
NITRITE: NEGATIVE
PH: 7 (ref 5.0–8.0)
PROTEIN: NEGATIVE mg/dL
Specific Gravity, Urine: 1.014 (ref 1.005–1.030)

## 2015-06-03 LAB — COMPREHENSIVE METABOLIC PANEL
ALBUMIN: 3.6 g/dL (ref 3.5–5.0)
ALT: 16 U/L — ABNORMAL LOW (ref 17–63)
ANION GAP: 14 (ref 5–15)
AST: 27 U/L (ref 15–41)
Alkaline Phosphatase: 27 U/L — ABNORMAL LOW (ref 38–126)
BILIRUBIN TOTAL: 0.5 mg/dL (ref 0.3–1.2)
BUN: 7 mg/dL (ref 6–20)
CO2: 23 mmol/L (ref 22–32)
Calcium: 9 mg/dL (ref 8.9–10.3)
Chloride: 99 mmol/L — ABNORMAL LOW (ref 101–111)
Creatinine, Ser: 0.75 mg/dL (ref 0.61–1.24)
GFR calc Af Amer: >60 mL/min (ref >60)
Glucose, Bld: 85 mg/dL (ref 65–99)
POTASSIUM: 3.4 mmol/L — AB (ref 3.5–5.1)
Sodium: 136 mmol/L (ref 135–145)
TOTAL PROTEIN: 6.1 g/dL — AB (ref 6.5–8.1)

## 2015-06-03 LAB — I-STAT TROPONIN, ED
TROPONIN I, POC: 0.01 ng/mL (ref 0.00–0.08)
Troponin i, poc: 0 ng/mL (ref 0.00–0.08)

## 2015-06-03 LAB — CBC WITH DIFFERENTIAL/PLATELET
BASOS ABS: 0 10*3/uL (ref 0.0–0.1)
Basophils Relative: 1 %
EOS ABS: 0.1 10*3/uL (ref 0.0–0.7)
EOS PCT: 2 %
HCT: 38.7 % — ABNORMAL LOW (ref 39.0–52.0)
HEMOGLOBIN: 12.8 g/dL — AB (ref 13.0–17.0)
LYMPHS ABS: 3 10*3/uL (ref 0.7–4.0)
Lymphocytes Relative: 46 %
MCH: 28.1 pg (ref 26.0–34.0)
MCHC: 33.1 g/dL (ref 30.0–36.0)
MCV: 85.1 fL (ref 78.0–100.0)
Monocytes Absolute: 0.5 10*3/uL (ref 0.1–1.0)
Monocytes Relative: 7 %
NEUTROS PCT: 44 %
Neutro Abs: 2.8 10*3/uL (ref 1.7–7.7)
PLATELETS: 195 10*3/uL (ref 150–400)
RBC: 4.55 MIL/uL (ref 4.22–5.81)
RDW: 13.8 % (ref 11.5–15.5)
WBC: 6.4 10*3/uL (ref 4.0–10.5)

## 2015-06-03 LAB — LIPASE, BLOOD: LIPASE: 28 U/L (ref 11–51)

## 2015-06-03 LAB — D-DIMER, QUANTITATIVE (NOT AT ARMC)

## 2015-06-03 LAB — I-STAT CG4 LACTIC ACID, ED
LACTIC ACID, VENOUS: 2.49 mmol/L — AB (ref 0.5–2.0)
LACTIC ACID, VENOUS: 0.94 mmol/L (ref 0.5–2.0)

## 2015-06-03 MED ORDER — KETOROLAC TROMETHAMINE 30 MG/ML IJ SOLN
30.0000 mg | Freq: Once | INTRAMUSCULAR | Status: AC
  Administered 2015-06-03: 30 mg via INTRAVENOUS
  Filled 2015-06-03: qty 1

## 2015-06-03 MED ORDER — ASPIRIN 81 MG PO CHEW
324.0000 mg | CHEWABLE_TABLET | Freq: Once | ORAL | Status: AC
  Administered 2015-06-03: 324 mg via ORAL
  Filled 2015-06-03: qty 4

## 2015-06-03 MED ORDER — SODIUM CHLORIDE 0.9 % IV BOLUS (SEPSIS)
1000.0000 mL | Freq: Once | INTRAVENOUS | Status: AC
  Administered 2015-06-03: 1000 mL via INTRAVENOUS

## 2015-06-03 NOTE — ED Provider Notes (Addendum)
CSN: [id number]     Arrival date & time 06/03/15  1600 History   First MD Initiated Contact with Patient 06/03/15 1617     Chief Complaint  Patient presents with  . Abdominal Pain     (Consider location/radiation/quality/duration/timing/severity/associated sxs/prior Treatment) HPI Comments: 60yo M w/ extensive PMH including CVA, CAD, T2DM, HTN, hLD who p/w L side pain. Patient states that 2-3 days ago, he began to have stabbing pains in his left side which he states is located under his left shoulder blade but is "deep," not superficial in his back. He was awakened by this pain at 3:15 AM and it has been constant and severe since that time. He denies any anterior chest pain, shortness of breath, nausea, or diaphoresis. This pain is very different from the pain he had prior to cardiac stenting. He states that he is having a hard time laying on his left side as it makes the pain worse. He denies any cough/cold symptoms, fevers, nausea, vomiting, or diarrhea. No abdominal pain.  Patient is a 60 y.o. male presenting with abdominal pain. The history is provided by the patient.  Abdominal Pain   Past Medical History  Diagnosis Date  . Stroke (HCC)   . Coronary artery disease   . History of cardiac cath   . Diabetes mellitus without complication (HCC)   . Hypertension   . Hyperlipidemia 01/04/2015  . Tobacco use 01/04/2015  . MDD (major depressive disorder) (HCC) 01/04/2015  . Anxiety 01/04/2015  . 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 Schizoaffective disorder (HCC) 01/04/2015  . H/O: CVA (cerebrovascular accident) 01/04/2015  . Chronic pain 01/04/2015  . CAD (coronary artery disease) 01/04/2015   Past Surgical History  Procedure Laterality Date  . Cardiac surgery    . Femur fracture surgery    . Femur im nail Left 08/15/2014    Procedure: Operative Fixation left periprosthetic femur fracture;  Surgeon: Tarry Kos, MD;  Location: [facility] OR;  Service: Orthopedics;  Laterality: Left;  . Hardware  removal Left 08/15/2014    Procedure: HARDWARE REMOVAL;  Surgeon: Tarry Kos, MD;  Location: [facility] OR;  Service: Orthopedics;  Laterality: Left;   Family History  Problem Relation Age of Onset  . Heart attack Mother    Social History  Substance Use Topics  . Smoking status: Current Every Day Smoker -- 0.50 packs/day for 45 years    Types: Cigarettes  . Smokeless tobacco: Never Used  . Alcohol Use: No    Review of Systems  Gastrointestinal: Positive for abdominal pain.   10 Systems reviewed and are negative for acute change except as noted in the HPI.    Allergies  Review of patient's allergies indicates no known allergies.  Home Medications   Prior to Admission medications   Medication Sig Start Date End Date Taking? Authorizing Provider  acetaminophen (TYLENOL) 325 MG tablet Take 650 mg by mouth every 6 (six) hours as needed for fever (pain).   Yes Historical Provider, MD  amLODipine (NORVASC) 10 MG tablet Take 10 mg by mouth daily.   Yes Historical Provider, MD  aspirin 81 MG chewable tablet Chew 81 mg by mouth daily.   Yes Historical Provider, MD  atorvastatin (LIPITOR) 20 MG tablet Take 20 mg by mouth daily at 6 PM.    Yes Historical Provider, MD  bisacodyl (DULCOLAX) 5 MG EC tablet Take 1 tablet (5 mg total) by mouth daily as needed for moderate constipation. 08/17/14  Yes Rodolph Bong,  MD  carvedilol (COREG) 3.125 MG tablet Take 1 tablet (3.125 mg total) by mouth 2 (two) times daily with a meal. 01/06/15  Yes Vassie Loll, MD  cloNIDine (CATAPRES) 0.1 MG tablet Take 0.1 mg by mouth 2 (two) times daily.   Yes Historical Provider, MD  diclofenac sodium (VOLTAREN) 1 % GEL Apply 2 g topically 2 (two) times daily. Apply to lower back, left hip, and left knee   Yes Historical Provider, MD  DULoxetine (CYMBALTA) 60 MG capsule Take 60 mg by mouth daily.   Yes Historical Provider, MD  febuxostat (ULORIC) 40 MG tablet Take 40 mg by mouth daily.   Yes Historical Provider, MD   fenofibrate 160 MG tablet Take 160 mg by mouth daily.   Yes Historical Provider, MD  gabapentin (NEURONTIN) 800 MG tablet Take 800 mg by mouth 2 (two) times daily.    Yes Historical Provider, MD  lamoTRIgine (LAMICTAL) 25 MG tablet Take 25 mg by mouth every morning.    Yes Historical Provider, MD  levETIRAcetam (KEPPRA) 500 MG tablet Take 1,000 mg by mouth 2 (two) times daily.   Yes Historical Provider, MD  metFORMIN (GLUCOPHAGE) 1000 MG tablet Take 1,000 mg by mouth 2 (two) times daily with a meal.   Yes Historical Provider, MD  nitroGLYCERIN (NITROSTAT) 0.4 MG SL tablet Place 0.4 mg under the tongue every 5 (five) minutes as needed for chest pain.   Yes Historical Provider, MD  nystatin cream (MYCOSTATIN) Apply 1 application topically 2 (two) times daily as needed (groin redness/itching).   Yes Historical Provider, MD  oxybutynin (DITROPAN-XL) 5 MG 24 hr tablet Take 5 mg by mouth at bedtime.   Yes Historical Provider, MD  Oxycodone HCl 10 MG TABS Take 10 mg by mouth 3 (three) times daily.   Yes Historical Provider, MD  pantoprazole (PROTONIX) 40 MG tablet Take 1 tablet (40 mg total) by mouth daily at 6 (six) AM. 01/06/15  Yes Vassie Loll, MD  QUEtiapine (SEROQUEL) 25 MG tablet Take 25 mg by mouth at bedtime.   Yes Historical Provider, MD  sertraline (ZOLOFT) 50 MG tablet Take 50 mg by mouth daily.   Yes Historical Provider, MD  lisinopril (PRINIVIL,ZESTRIL) 10 MG tablet Take 10 mg by mouth daily.    Historical Provider, MD  nicotine (NICODERM CQ - DOSED IN MG/24 HOURS) 21 mg/24hr patch Place 1 patch (21 mg total) onto the skin daily. 01/06/15   Vassie Loll, MD  oxyCODONE-acetaminophen (PERCOCET) 5-325 MG tablet Take 1 tablet by mouth every 6 (six) hours as needed for severe pain. 02/03/15   Mercedes Camprubi-Soms, PA-C   BP 145/91 mmHg  Pulse 59  Temp(Src) 98.3 F (36.8 C) (Oral)  Resp 13  SpO2 98% Physical Exam  Constitutional: He is oriented to person, place, and time. He appears  well-developed and well-nourished. No distress.  HENT:  Head: Normocephalic and atraumatic.  Moist mucous membranes  Eyes: Conjunctivae are normal. Pupils are equal, round, and reactive to light.  Neck: Neck supple.  Cardiovascular: Normal rate, regular rhythm and normal heart sounds.   No murmur heard. Pulmonary/Chest: Effort normal and breath sounds normal.  Abdominal: Soft. Bowel sounds are normal. He exhibits no distension. There is no tenderness.  Musculoskeletal: He exhibits no edema.  BLE weakness (chronic); no TTP back or L chest wall  Neurological: He is alert and oriented to person, place, and time.  Fluent speech  Skin: Skin is warm and dry.  Psychiatric: He has a normal mood and affect.  Judgment normal.  Nursing note and vitals reviewed.   ED Course  Procedures (including critical care time) Labs Review Labs Reviewed  COMPREHENSIVE METABOLIC PANEL - Abnormal; Notable for the following:    Potassium 3.4 (*)    Chloride 99 (*)    Total Protein 6.1 (*)    ALT 16 (*)    Alkaline Phosphatase 27 (*)    All other components within normal limits  CBC WITH DIFFERENTIAL/PLATELET - Abnormal; Notable for the following:    Hemoglobin 12.8 (*)    HCT 38.7 (*)    All other components within normal limits  I-STAT CG4 LACTIC ACID, ED - Abnormal; Notable for the following:    Lactic Acid, Venous 2.49 (*)    All other components within normal limits  LIPASE, BLOOD  URINALYSIS, ROUTINE W REFLEX MICROSCOPIC (NOT AT [facility])  D-DIMER, QUANTITATIVE (NOT AT [facility])  I-STAT TROPOININ, ED  I-STAT TROPOININ, ED  I-STAT CG4 LACTIC ACID, ED    Imaging Review Dg Chest 2 View  06/03/2015  CLINICAL DATA:  The left posterior chest pain since last night. History of left-sided weakness from stroke in 2008, hypertension and diabetes. EXAM: CHEST  2 VIEW COMPARISON:  Radiographs 01/04/2015. FINDINGS: The heart size and mediastinal contours are stable. The lungs are hyperinflated with chronic central  airway thickening. There is no edema, airspace disease or pleural effusion. At least 1 old left-sided rib fracture and mid thoracic spine anterior wedging are unchanged. No acute osseous findings are seen. IMPRESSION: Stable chest with evidence of chronic obstructive pulmonary disease. No acute cardiopulmonary process. Electronically Signed   By: Carey Bullocks M.D.   On: 06/03/2015 17:34   I have personally reviewed and evaluated these lab results as part of my medical decision-making.   EKG Interpretation   Date/Time:  Monday June 03 2015 17:52:26 EST Ventricular Rate:  60 PR Interval:  133 QRS Duration: 93 QT Interval:  433 QTC Calculation: 433 R Axis:   67 Text Interpretation:  Sinus rhythm Abnormal R-wave progression, early  transition Borderline T wave abnormalities No significant change since  last tracing Confirmed by Hazely Sealey MD, Asia Favata [phone]) on 06/03/2015 6:42:24 PM      MDM   Final diagnoses:  Pain of left scapula   Pt p/w 2-3 days of initially intermittent, now constant L side pain which is not located in his abdomen but he states that it is just below his left shoulder blade, "deep inside." On exam he was well-appearing with normal vital signs. No tenderness to palpation of his back or chest wall. He denies any other associated symptoms. Because of his extensive cardiac history, obtained above lab work as well as EKG and chest x-ray. Gave the pt aspirin.  EKG unchanged from previous. Chest x-ray negative for acute process. Initial lactate mildly elevated at 2.49. After 1 L of IV fluids, repeat lactate was normal. No reports of fevers or infectious symptoms to suggest sepsis. No abdominal pain and no LUQ TTP. Serial troponins negative. On reexamination, the patient continues to state that his pain is behind his left scapula and not located in his chest. Obtained a d-dimer to rule out PE which was negative. Pt has no risk factors for blood clots.  The patient remains  comfortable on reexamination. Given that he states the pain is worse with laying on his left side, I suspect a musculoskeletal etiology of his pain. He has continued to deny any symptoms suggestive of cardiac process such as chest pain or shortness  of breath. Furthermore, he states that this pain is nothing like the pain he had related to his cardiac stenting. I have instructed him on supportive care as well as importance of follow-up with PCP. I extensively reviewed return precautions including any anterior pain, shortness of breath, or other new symptoms. Patient voiced understanding and was discharged in satisfactory condition.  Laurence Spates, MD 06/03/15 2345  Laurence Spates, MD 06/03/15 [phone]

## 2015-06-03 NOTE — ED Notes (Signed)
MD at bedside. 

## 2015-06-03 NOTE — ED Notes (Signed)
PTAR CALLED @ 2348.

## 2015-06-03 NOTE — ED Notes (Addendum)
Pt to ED via [facility] with c/o left upper abd pain onset yesterday.  No nausea, vomiting or diarrhea.  Pt st's pain is under his left shoulder blade.

## 2015-06-04 NOTE — ED Notes (Signed)
Patient verbalized understanding of discharge instructions and denies any further needs or questions at this time. VS stable. Patient ambulatory with steady gait. Going home to [facility] via [place].

## 2016-06-03 ENCOUNTER — Emergency Department (HOSPITAL_COMMUNITY): Payer: Medicare Other

## 2016-06-03 ENCOUNTER — Inpatient Hospital Stay (HOSPITAL_COMMUNITY)
Admission: EM | Admit: 2016-06-03 | Discharge: 2016-06-12 | DRG: 917 | Disposition: A | Discharge status: Home / Self Care | Payer: Medicare Other | Attending: Family Medicine | Admitting: Family Medicine

## 2016-06-03 DIAGNOSIS — R600 Localized edema: Secondary | ICD-10-CM | POA: Diagnosis present

## 2016-06-03 DIAGNOSIS — T40601A Poisoning by unspecified narcotics, accidental (unintentional), initial encounter: Secondary | ICD-10-CM | POA: Diagnosis present

## 2016-06-03 DIAGNOSIS — R4781 Slurred speech: Secondary | ICD-10-CM | POA: Diagnosis present

## 2016-06-03 DIAGNOSIS — Z01818 Encounter for other preprocedural examination: Secondary | ICD-10-CM

## 2016-06-03 DIAGNOSIS — R402122 Coma scale, eyes open, to pain, at arrival to emergency department: Secondary | ICD-10-CM | POA: Diagnosis present

## 2016-06-03 DIAGNOSIS — G9340 Encephalopathy, unspecified: Secondary | ICD-10-CM | POA: Diagnosis present

## 2016-06-03 DIAGNOSIS — Z7982 Long term (current) use of aspirin: Secondary | ICD-10-CM

## 2016-06-03 DIAGNOSIS — Z7984 Long term (current) use of oral hypoglycemic drugs: Secondary | ICD-10-CM

## 2016-06-03 DIAGNOSIS — R29727 NIHSS score 27: Secondary | ICD-10-CM | POA: Diagnosis present

## 2016-06-03 DIAGNOSIS — E870 Hyperosmolality and hypernatremia: Secondary | ICD-10-CM | POA: Diagnosis not present

## 2016-06-03 DIAGNOSIS — J690 Pneumonitis due to inhalation of food and vomit: Secondary | ICD-10-CM | POA: Diagnosis present

## 2016-06-03 DIAGNOSIS — R001 Bradycardia, unspecified: Secondary | ICD-10-CM | POA: Diagnosis not present

## 2016-06-03 DIAGNOSIS — T402X1A Poisoning by other opioids, accidental (unintentional), initial encounter: Principal | ICD-10-CM | POA: Diagnosis present

## 2016-06-03 DIAGNOSIS — J9621 Acute and chronic respiratory failure with hypoxia: Secondary | ICD-10-CM | POA: Diagnosis present

## 2016-06-03 DIAGNOSIS — R1112 Projectile vomiting: Secondary | ICD-10-CM | POA: Diagnosis present

## 2016-06-03 DIAGNOSIS — G40909 Epilepsy, unspecified, not intractable, without status epilepticus: Secondary | ICD-10-CM | POA: Diagnosis present

## 2016-06-03 DIAGNOSIS — Z8673 Personal history of transient ischemic attack (TIA), and cerebral infarction without residual deficits: Secondary | ICD-10-CM

## 2016-06-03 DIAGNOSIS — I69344 Monoplegia of lower limb following cerebral infarction affecting left non-dominant side: Secondary | ICD-10-CM

## 2016-06-03 DIAGNOSIS — F17210 Nicotine dependence, cigarettes, uncomplicated: Secondary | ICD-10-CM | POA: Diagnosis present

## 2016-06-03 DIAGNOSIS — E785 Hyperlipidemia, unspecified: Secondary | ICD-10-CM | POA: Diagnosis present

## 2016-06-03 DIAGNOSIS — R4182 Altered mental status, unspecified: Secondary | ICD-10-CM

## 2016-06-03 DIAGNOSIS — G92 Toxic encephalopathy: Secondary | ICD-10-CM | POA: Diagnosis present

## 2016-06-03 DIAGNOSIS — F419 Anxiety disorder, unspecified: Secondary | ICD-10-CM | POA: Diagnosis present

## 2016-06-03 DIAGNOSIS — R402222 Coma scale, best verbal response, incomprehensible words, at arrival to emergency department: Secondary | ICD-10-CM | POA: Diagnosis present

## 2016-06-03 DIAGNOSIS — F259 Schizoaffective disorder, unspecified: Secondary | ICD-10-CM | POA: Diagnosis present

## 2016-06-03 DIAGNOSIS — Z781 Physical restraint status: Secondary | ICD-10-CM

## 2016-06-03 DIAGNOSIS — D72819 Decreased white blood cell count, unspecified: Secondary | ICD-10-CM | POA: Diagnosis not present

## 2016-06-03 DIAGNOSIS — R402362 Coma scale, best motor response, obeys commands, at arrival to emergency department: Secondary | ICD-10-CM | POA: Diagnosis present

## 2016-06-03 DIAGNOSIS — F329 Major depressive disorder, single episode, unspecified: Secondary | ICD-10-CM | POA: Diagnosis present

## 2016-06-03 DIAGNOSIS — R32 Unspecified urinary incontinence: Secondary | ICD-10-CM | POA: Diagnosis present

## 2016-06-03 DIAGNOSIS — E119 Type 2 diabetes mellitus without complications: Secondary | ICD-10-CM

## 2016-06-03 DIAGNOSIS — J9690 Respiratory failure, unspecified, unspecified whether with hypoxia or hypercapnia: Secondary | ICD-10-CM

## 2016-06-03 DIAGNOSIS — Z4659 Encounter for fitting and adjustment of other gastrointestinal appliance and device: Secondary | ICD-10-CM

## 2016-06-03 DIAGNOSIS — E878 Other disorders of electrolyte and fluid balance, not elsewhere classified: Secondary | ICD-10-CM | POA: Diagnosis present

## 2016-06-03 DIAGNOSIS — J9811 Atelectasis: Secondary | ICD-10-CM | POA: Diagnosis present

## 2016-06-03 DIAGNOSIS — R471 Dysarthria and anarthria: Secondary | ICD-10-CM | POA: Diagnosis present

## 2016-06-03 DIAGNOSIS — I2510 Atherosclerotic heart disease of native coronary artery without angina pectoris: Secondary | ICD-10-CM | POA: Diagnosis present

## 2016-06-03 DIAGNOSIS — F29 Unspecified psychosis not due to a substance or known physiological condition: Secondary | ICD-10-CM | POA: Diagnosis present

## 2016-06-03 DIAGNOSIS — I10 Essential (primary) hypertension: Secondary | ICD-10-CM | POA: Diagnosis present

## 2016-06-03 DIAGNOSIS — Z79899 Other long term (current) drug therapy: Secondary | ICD-10-CM

## 2016-06-03 LAB — PROTIME-INR
INR: 1.09
PROTHROMBIN TIME: 14.1 s (ref 11.4–15.2)

## 2016-06-03 LAB — APTT: APTT: 28 s (ref 24–36)

## 2016-06-03 LAB — I-STAT CHEM 8, ED
BUN: 12 mg/dL (ref 6–20)
CALCIUM ION: 1.05 mmol/L — AB (ref 1.15–1.40)
CHLORIDE: 107 mmol/L (ref 101–111)
CREATININE: 1 mg/dL (ref 0.61–1.24)
GLUCOSE: 93 mg/dL (ref 65–99)
HCT: 39 % (ref 39.0–52.0)
Hemoglobin: 13.3 g/dL (ref 13.0–17.0)
POTASSIUM: 3.9 mmol/L (ref 3.5–5.1)
Sodium: 140 mmol/L (ref 135–145)
TCO2: 24 mmol/L (ref 0–100)

## 2016-06-03 LAB — I-STAT TROPONIN, ED: Troponin i, poc: 0 ng/mL (ref 0.00–0.08)

## 2016-06-03 LAB — CBG MONITORING, ED: Glucose-Capillary: 112 mg/dL — ABNORMAL HIGH (ref 65–99)

## 2016-06-03 MED ORDER — LABETALOL HCL 5 MG/ML IV SOLN
INTRAVENOUS | Status: AC
  Filled 2016-06-03: qty 4

## 2016-06-03 MED ORDER — LORAZEPAM 2 MG/ML IJ SOLN
INTRAMUSCULAR | Status: AC
  Filled 2016-06-03: qty 1

## 2016-06-03 MED ORDER — PROPOFOL 1000 MG/100ML IV EMUL
INTRAVENOUS | Status: AC
  Administered 2016-06-03: 10 mg/h via INTRAVENOUS
  Filled 2016-06-03: qty 100

## 2016-06-03 MED ORDER — ETOMIDATE 2 MG/ML IV SOLN
INTRAVENOUS | Status: DC | PRN
  Administered 2016-06-03: 20 mg via INTRAVENOUS

## 2016-06-03 MED ORDER — SUCCINYLCHOLINE CHLORIDE 20 MG/ML IJ SOLN
INTRAMUSCULAR | Status: DC | PRN
  Administered 2016-06-03: 100 mg via INTRAVENOUS

## 2016-06-03 MED ORDER — LEVETIRACETAM 500 MG/5ML IV SOLN
1000.0000 mg | Freq: Once | INTRAVENOUS | Status: AC
  Administered 2016-06-03: 1000 mg via INTRAVENOUS
  Filled 2016-06-03: qty 10

## 2016-06-03 MED ORDER — LABETALOL HCL 5 MG/ML IV SOLN
20.0000 mg | Freq: Once | INTRAVENOUS | Status: AC
  Administered 2016-06-03: 20 mg via INTRAVENOUS

## 2016-06-03 MED ORDER — SODIUM CHLORIDE 0.9 % IV SOLN
1000.0000 mg | Freq: Two times a day (BID) | INTRAVENOUS | Status: DC
  Filled 2016-06-03 (×2): qty 10

## 2016-06-03 MED ORDER — PROPOFOL 1000 MG/100ML IV EMUL
5.0000 ug/kg/min | INTRAVENOUS | Status: DC
  Administered 2016-06-03: 10 mg/h via INTRAVENOUS

## 2016-06-03 MED ORDER — LEVETIRACETAM 500 MG/5ML IV SOLN: 1000.0000 mg | Freq: Once | INTRAVENOUS | Status: DC

## 2016-06-03 MED ORDER — LORAZEPAM 2 MG/ML IJ SOLN
2.0000 mg | Freq: Once | INTRAMUSCULAR | Status: AC
  Administered 2016-06-03: 2 mg via INTRAVENOUS

## 2016-06-03 NOTE — Code Documentation (Addendum)
Pt was playing cards at nursing home, started having slurred speech,  PTA given 1.4 narcan, unresponsive. Responded to narcan the first time, hx of seizures, LSN 1045 in route

## 2016-06-03 NOTE — Code Documentation (Signed)
Pt vomiting.

## 2016-06-03 NOTE — ED Provider Notes (Signed)
[facility]-EMERG[facility]Y DEPT Provider Note   CSN: [id number] Arrival date & time: 06/03/16  2330  By signing my name below, I, Terrance Cook, attest that this documentation has been prepared under the direction and in the presence of Terrance Octave, MD . Electronically Signed: Freida Cook, Scribe. 06/03/2016. 11:51 PM.  History   Chief Complaint Chief Complaint  Patient presents with  . Unresponsive   LEVEL 5 CAVEAT DUE TO ACUITY OF MEDICAL CONDITION  The history is provided by the EMS personnel. No language interpreter was used.     HPI Comments:  Terrance Cook is a 61 y.o. male with a history of CVA, seizures, CAD, and DM, who presents to the Emergency Department via EMS from NH for slurred speech and unresponsiveness. Per EMS, pt was playing cards with other residents ~2245 tonight when he began to have slurred speech. Pt was initially alert and oriented x 4 en route but became unresponsive ~ 10 minutes PTA. He was also incontinent of urine en route. Pt was given 1.4 mg of narcan with no change. EMS reports oxygen saturation of 97% on RA.  Past Medical History:  Diagnosis Date  . Anxiety 01/04/2015  . CAD (coronary artery disease) 01/04/2015  . Chronic pain 01/04/2015  . Coronary artery disease   . Diabetes mellitus without complication (HCC)   . H/O: CVA (cerebrovascular accident) 01/04/2015  . History of cardiac cath   . Hyperlipidemia 01/04/2015  . Hypertension   . MDD (major depressive disorder) 01/04/2015  . Schizoaffective disorder (HCC) 01/04/2015  . 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 Stroke (HCC)   . Tobacco use 01/04/2015    Patient Active Problem List   Diagnosis Date Noted  . Chest pain   . Typical angina (HCC) 01/04/2015  . Hyperlipidemia 01/04/2015  . Tobacco use 01/04/2015  . MDD (major depressive disorder) 01/04/2015  . Anxiety 01/04/2015  . 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 Schizoaffective disorder (HCC) 01/04/2015  . H/O: CVA (cerebrovascular  accident) 01/04/2015  . Chronic pain 01/04/2015  . CAD (coronary artery disease) 01/04/2015  . Angina at rest [facility]) 01/04/2015  . Postoperative anemia due to acute blood loss   . Femur fracture (HCC)   . Femur fracture, left (HCC) 08/14/2014  . Hip fracture (HCC) 08/14/2014  . Acute encephalopathy 08/14/2014  . Fall   . Left hemiparesis (HCC) 08/09/2014  . Diabetes mellitus type 2, controlled, without complications (HCC) 08/09/2014  . Essential hypertension 08/09/2014  . Acute respiratory failure with hypercapnia (HCC) 08/09/2014  . Altered mental status 08/08/2014    Past Surgical History:  Procedure Laterality Date  . CARDIAC SURGERY    . FEMUR FRACTURE SURGERY    . FEMUR IM NAIL Left 08/15/2014   Procedure: Operative Fixation left periprosthetic femur fracture;  Surgeon: Tarry Kos, MD;  Location: [facility] OR;  Service: Orthopedics;  Laterality: Left;  . HARDWARE REMOVAL Left 08/15/2014   Procedure: HARDWARE REMOVAL;  Surgeon: Tarry Kos, MD;  Location: [facility] OR;  Service: Orthopedics;  Laterality: Left;       Home Medications    Prior to Admission medications   Medication Sig Start Date End Date Taking? Authorizing Provider  acetaminophen (TYLENOL) 325 MG tablet Take 650 mg by mouth every 6 (six) hours as needed for fever (pain).    Historical Provider, MD  amLODipine (NORVASC) 10 MG tablet Take 10 mg by mouth daily.    Historical Provider, MD  aspirin 81 MG chewable tablet Chew 81 mg  by mouth daily.    Historical Provider, MD  atorvastatin (LIPITOR) 20 MG tablet Take 20 mg by mouth daily at 6 PM.     Historical Provider, MD  bisacodyl (DULCOLAX) 5 MG EC tablet Take 1 tablet (5 mg total) by mouth daily as needed for moderate constipation. 08/17/14   Terrance Bong, MD  carvedilol (COREG) 3.125 MG tablet Take 1 tablet (3.125 mg total) by mouth 2 (two) times daily with a meal. 01/06/15   Terrance Loll, MD  cloNIDine (CATAPRES) 0.1 MG tablet Take 0.1 mg by mouth 2 (two) times  daily.    Historical Provider, MD  diclofenac sodium (VOLTAREN) 1 % GEL Apply 2 g topically 2 (two) times daily. Apply to lower back, left hip, and left knee    Historical Provider, MD  DULoxetine (CYMBALTA) 60 MG capsule Take 60 mg by mouth daily.    Historical Provider, MD  febuxostat (ULORIC) 40 MG tablet Take 40 mg by mouth daily.    Historical Provider, MD  fenofibrate 160 MG tablet Take 160 mg by mouth daily.    Historical Provider, MD  gabapentin (NEURONTIN) 800 MG tablet Take 800 mg by mouth 2 (two) times daily.     Historical Provider, MD  lamoTRIgine (LAMICTAL) 25 MG tablet Take 25 mg by mouth every morning.     Historical Provider, MD  levETIRAcetam (KEPPRA) 500 MG tablet Take 1,000 mg by mouth 2 (two) times daily.    Historical Provider, MD  lisinopril (PRINIVIL,ZESTRIL) 10 MG tablet Take 10 mg by mouth daily.    Historical Provider, MD  metFORMIN (GLUCOPHAGE) 1000 MG tablet Take 1,000 mg by mouth 2 (two) times daily with a meal.    Historical Provider, MD  nicotine (NICODERM CQ - DOSED IN MG/24 HOURS) 21 mg/24hr patch Place 1 patch (21 mg total) onto the skin daily. 01/06/15   Terrance Loll, MD  nitroGLYCERIN (NITROSTAT) 0.4 MG SL tablet Place 0.4 mg under the tongue every 5 (five) minutes as needed for chest pain.    Historical Provider, MD  nystatin cream (MYCOSTATIN) Apply 1 application topically 2 (two) times daily as needed (groin redness/itching).    Historical Provider, MD  oxybutynin (DITROPAN-XL) 5 MG 24 hr tablet Take 5 mg by mouth at bedtime.    Historical Provider, MD  Oxycodone HCl 10 MG TABS Take 10 mg by mouth 3 (three) times daily.    Historical Provider, MD  oxyCODONE-acetaminophen (PERCOCET) 5-325 MG tablet Take 1 tablet by mouth every 6 (six) hours as needed for severe pain. 02/03/15   Terrance Street, PA-C  pantoprazole (PROTONIX) 40 MG tablet Take 1 tablet (40 mg total) by mouth daily at 6 (six) AM. 01/06/15   Terrance Loll, MD  QUEtiapine (SEROQUEL) 25 MG tablet Take  25 mg by mouth at bedtime.    Historical Provider, MD  sertraline (ZOLOFT) 50 MG tablet Take 50 mg by mouth daily.    Historical Provider, MD    Family History Family History  Problem Relation Age of Onset  . Heart attack Mother     Social History Social History  Substance Use Topics  . Smoking status: Current Every Day Smoker    Packs/day: 0.50    Years: 45.00    Types: Cigarettes  . Smokeless tobacco: Never Used  . Alcohol use No     Allergies   Patient has no known allergies.   Review of Systems Review of Systems  Unable to perform ROS: Acuity of condition   Physical  Exam Updated Vital Signs BP (!) 145/114   Pulse 93   Resp 20   SpO2 100%   Physical Exam  Constitutional: He appears well-developed and well-nourished.  HENT:  Head: Normocephalic and atraumatic.  Mouth/Throat: Oropharynx is clear and moist. No oropharyngeal exudate.  Eyes: Conjunctivae are normal.  Pupils are 3 mm and sluggish   Neck: Normal range of motion. Neck supple.  No meningismus.  Cardiovascular: Regular rhythm, normal heart sounds and intact distal pulses.  Tachycardia present.   No murmur heard. Pulmonary/Chest: Breath sounds normal.  Decorticate posturing of upper extremities  sonorous respirations  Abdominal: Soft. There is no tenderness. There is no rebound and no guarding.  Musculoskeletal: Normal range of motion. He exhibits edema. He exhibits no tenderness.  Left leg more swollen than right   Neurological:  Incontinent of urine Decorticate posturing of upper extremities.  Does not speak or follow commands.  Skin: Skin is warm.  Nursing note and vitals reviewed.    ED Treatments / Results  DIAGNOSTIC STUDIES:  Oxygen Saturation is 100% on ETT, normal by my interpretation.    Labs (all labs ordered are listed, but only abnormal results are displayed) Labs Reviewed  CBC - Abnormal; Notable for the following:       Result Value   Hemoglobin 12.4 (*)    HCT 38.7 (*)     Platelets 146 (*)    All other components within normal limits  CBG MONITORING, ED - Abnormal; Notable for the following:    Glucose-Capillary 112 (*)    All other components within normal limits  I-STAT CHEM 8, ED - Abnormal; Notable for the following:    Calcium, Ion 1.05 (*)    All other components within normal limits  PROTIME-INR  APTT  DIFFERENTIAL  ETHANOL  RAPID URINE DRUG SCREEN, HOSP PERFORMED  URINALYSIS, ROUTINE W REFLEX MICROSCOPIC  COMPREHENSIVE METABOLIC PANEL  I-STAT TROPOININ, ED  I-STAT CHEM 8, ED  I-STAT TROPOININ, ED  CBG MONITORING, ED  I-STAT ARTERIAL BLOOD GAS, ED  I-STAT CG4 LACTIC ACID, ED    EKG  EKG Interpretation None       Radiology Dg Chest Portable 1 View  Result Date: 06/04/2016 CLINICAL DATA:  Intubation.  Unresponsive. EXAM: PORTABLE CHEST 1 VIEW COMPARISON:  06/03/2015 FINDINGS: The endotracheal tube is been placed with tip measuring 3 cm above the carina. Shallow inspiration with atelectasis in the lung bases. Increased opacity in the left perihilar region may be due to vascular crowding because of shallow inspiration but early infiltration or edema is not excluded. No blunting of costophrenic angles. No pneumothorax. Tortuous aorta. IMPRESSION: Endotracheal tube appears in satisfactory position. Shallow inspiration with atelectasis in the lung bases. Increased left perihilar density may be due to shallow inspiration or may indicate early infiltrate/edema. Electronically Signed   By: Burman NievesWilliam  Stevens M.D.   On: 06/04/2016 00:18   Ct Head Code Stroke W/o Cm  Result Date: 06/04/2016 CLINICAL DATA:  Code stroke. EXAM: CT HEAD WITHOUT CONTRAST TECHNIQUE: Contiguous axial images were obtained from the base of the skull through the vertex without intravenous contrast. COMPARISON:  08/08/2014 CT head FINDINGS: Brain: Stable chronic right ACA distribution encephalomalacia from prior infarction. Stable chronic microvascular ischemic changes of white  matter. No large acute infarct, focal mass effect, hydrocephalus, or intracranial hemorrhage identified. Vascular: Mild calcific atherosclerosis of cavernous internal carotid arteries bilaterally. Skull: No displaced calvarial fracture. Sinuses/Orbits: Mucosal thickening within ethmoid sinus is probably due to intubation. Orbits are unremarkable. Other:  None. ASPECTS (Alberta Stroke Program Early CT Score) - Ganglionic level infarction (caudate, lentiform nuclei, internal capsule, insula, M1-M3 cortex): 7 - Supraganglionic infarction (M4-M6 cortex): 3 Total score (0-10 with 10 being normal): 10 IMPRESSION: 1. No acute intracranial abnormality identified. 2. ASPECTS is 10 3. 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 Electronically Signed   By: Mitzi Hansen M.D.   On: 06/04/2016 00:27    Procedures Procedures   EMERGENT INTUBATION PROCEDURE NOTE INDICATION: airway compromise  TECHNIQUE: Unable to obtain consent because of altered level of consciousness.  After pre-oxygenating the patient for 3  minutes, a modified rapid-sequence induction was performed using etomidate and succinylcholine with cricoid pressure. Using a Macintosh 3 laryngoscope blade and 7.12mm cuffed endotracheal tube was placed (24 at the lip) and secured.  Placement was confirmed with by auscultation.  COMPLICATIONS: None. The patient tolerated the procedure well with no complications. POST PROCEDURE CXR: tube position acceptable      CRITICAL CARE Performed by: Terrance Octave, MD  Total critical care time: 45 minutes  Critical care time was exclusive of separately billable procedures and treating other patients.  Critical care was necessary to treat or prevent imminent or life-threatening deterioration.  Critical care was time spent personally by me on the following activities: development of treatment plan with patient and/or surrogate as well as nursing,  discussions with consultants, evaluation of patient's response to treatment, examination of patient, obtaining history from patient or surrogate, ordering and performing treatments and interventions, ordering and review of laboratory studies, ordering and review of radiographic studies, pulse oximetry and re-evaluation of patient's condition.    Medications Ordered in ED Medications  etomidate (AMIDATE) injection (20 mg Intravenous Given 06/03/16 2342)  succinylcholine (ANECTINE) injection (100 mg Intravenous Given 06/03/16 2343)  propofol (DIPRIVAN) 1000 MG/100ML infusion (10 mg/hr Intravenous New Bag/Given 06/03/16 2355)  LORazepam (ATIVAN) injection 2 mg (2 mg Intravenous Given 06/03/16 2344)  levETIRAcetam (KEPPRA) 1,000 mg in sodium chloride 0.9 % 100 mL IVPB (1,000 mg Intravenous New Bag/Given 06/03/16 2353)  labetalol (NORMODYNE,TRANDATE) injection 20 mg (20 mg Intravenous Given 06/03/16 2353)  iopamidol (ISOVUE-370) 76 % injection (50 mLs Intravenous Contrast Given 06/04/16 0023)     Initial Impression / Assessment and Plan / ED Course  I have reviewed the triage vital signs and the nursing notes.  Pertinent labs & imaging results that were available during my care of the patient were reviewed by me and considered in my medical decision making (see chart for details).     Patient brought by EMS as code stroke after being found unresponsive at 2245 at his nursing home. He was given Narcan with partial response. He is somnolent with snoring respirations and does not follow commands.  Additional Narcan given without response. Patient intubated for airway protection. CT does not show any hemorrhage. Patient given IV labetalol for hypertension. He is incontinent but no seizure activity was seen. He does have a history of seizures and IV Keppra was given.  CTA negative for large vessel occlusion. Seen by Dr. Otelia Limes of  neurology. He feels patient's presentation could be postictal versus opiate  overdose. Brainstem stroke is also considered. He does not feel patient is a candidate for TPA. He discussed with patient's daughter.  Patient sedated with propofol. Blood pressure has improved to 150 systolic. IV Keppra loading dose given. ICU admission discussed with Dr. Sung Amabile of critical care.  Contrary to other notes, patient did not have any vomiting during  intubation.  He did vomit after intubation after his airway was secured.    Final Clinical Impressions(s) / ED Diagnoses   Final diagnoses:  Slurred speech  Leg edema, left  Encounter for orogastric (OG) tube placement  Acute on chronic respiratory failure with hypoxemia (HCC)    New Prescriptions New Prescriptions   No medications on file   I personally performed the services described in this documentation, which was scribed in my presence. The recorded information has been reviewed and is accurate.    Terrance Octave, MD 06/04/16 [phone]

## 2016-06-04 ENCOUNTER — Emergency Department (HOSPITAL_COMMUNITY): Payer: Medicare Other

## 2016-06-04 ENCOUNTER — Inpatient Hospital Stay (HOSPITAL_COMMUNITY): Payer: Medicare Other

## 2016-06-04 ENCOUNTER — Encounter (HOSPITAL_COMMUNITY): Payer: Self-pay | Admitting: *Deleted

## 2016-06-04 DIAGNOSIS — E785 Hyperlipidemia, unspecified: Secondary | ICD-10-CM | POA: Diagnosis present

## 2016-06-04 DIAGNOSIS — R29727 NIHSS score 27: Secondary | ICD-10-CM | POA: Diagnosis present

## 2016-06-04 DIAGNOSIS — F329 Major depressive disorder, single episode, unspecified: Secondary | ICD-10-CM | POA: Diagnosis present

## 2016-06-04 DIAGNOSIS — J690 Pneumonitis due to inhalation of food and vomit: Secondary | ICD-10-CM | POA: Diagnosis present

## 2016-06-04 DIAGNOSIS — I10 Essential (primary) hypertension: Secondary | ICD-10-CM | POA: Diagnosis present

## 2016-06-04 DIAGNOSIS — G92 Toxic encephalopathy: Secondary | ICD-10-CM | POA: Diagnosis present

## 2016-06-04 DIAGNOSIS — G9340 Encephalopathy, unspecified: Secondary | ICD-10-CM | POA: Diagnosis not present

## 2016-06-04 DIAGNOSIS — R4781 Slurred speech: Secondary | ICD-10-CM | POA: Diagnosis present

## 2016-06-04 DIAGNOSIS — R402122 Coma scale, eyes open, to pain, at arrival to emergency department: Secondary | ICD-10-CM | POA: Diagnosis present

## 2016-06-04 DIAGNOSIS — Z4659 Encounter for fitting and adjustment of other gastrointestinal appliance and device: Secondary | ICD-10-CM

## 2016-06-04 DIAGNOSIS — R402222 Coma scale, best verbal response, incomprehensible words, at arrival to emergency department: Secondary | ICD-10-CM | POA: Diagnosis present

## 2016-06-04 DIAGNOSIS — F17210 Nicotine dependence, cigarettes, uncomplicated: Secondary | ICD-10-CM | POA: Diagnosis present

## 2016-06-04 DIAGNOSIS — F411 Generalized anxiety disorder: Secondary | ICD-10-CM | POA: Diagnosis not present

## 2016-06-04 DIAGNOSIS — Z01818 Encounter for other preprocedural examination: Secondary | ICD-10-CM | POA: Diagnosis not present

## 2016-06-04 DIAGNOSIS — J9811 Atelectasis: Secondary | ICD-10-CM | POA: Diagnosis present

## 2016-06-04 DIAGNOSIS — E870 Hyperosmolality and hypernatremia: Secondary | ICD-10-CM | POA: Diagnosis not present

## 2016-06-04 DIAGNOSIS — J9621 Acute and chronic respiratory failure with hypoxia: Secondary | ICD-10-CM | POA: Diagnosis present

## 2016-06-04 DIAGNOSIS — J9601 Acute respiratory failure with hypoxia: Secondary | ICD-10-CM | POA: Diagnosis not present

## 2016-06-04 DIAGNOSIS — R600 Localized edema: Secondary | ICD-10-CM

## 2016-06-04 DIAGNOSIS — E119 Type 2 diabetes mellitus without complications: Secondary | ICD-10-CM | POA: Diagnosis present

## 2016-06-04 DIAGNOSIS — T402X1A Poisoning by other opioids, accidental (unintentional), initial encounter: Secondary | ICD-10-CM | POA: Diagnosis present

## 2016-06-04 DIAGNOSIS — R001 Bradycardia, unspecified: Secondary | ICD-10-CM | POA: Diagnosis not present

## 2016-06-04 DIAGNOSIS — R471 Dysarthria and anarthria: Secondary | ICD-10-CM | POA: Diagnosis present

## 2016-06-04 DIAGNOSIS — Z79899 Other long term (current) drug therapy: Secondary | ICD-10-CM | POA: Diagnosis not present

## 2016-06-04 DIAGNOSIS — Z7982 Long term (current) use of aspirin: Secondary | ICD-10-CM | POA: Diagnosis not present

## 2016-06-04 DIAGNOSIS — F259 Schizoaffective disorder, unspecified: Secondary | ICD-10-CM | POA: Diagnosis present

## 2016-06-04 DIAGNOSIS — I69344 Monoplegia of lower limb following cerebral infarction affecting left non-dominant side: Secondary | ICD-10-CM | POA: Diagnosis not present

## 2016-06-04 DIAGNOSIS — T40601A Poisoning by unspecified narcotics, accidental (unintentional), initial encounter: Secondary | ICD-10-CM | POA: Diagnosis present

## 2016-06-04 DIAGNOSIS — I2510 Atherosclerotic heart disease of native coronary artery without angina pectoris: Secondary | ICD-10-CM | POA: Diagnosis present

## 2016-06-04 DIAGNOSIS — D72819 Decreased white blood cell count, unspecified: Secondary | ICD-10-CM | POA: Diagnosis not present

## 2016-06-04 DIAGNOSIS — E878 Other disorders of electrolyte and fluid balance, not elsewhere classified: Secondary | ICD-10-CM | POA: Diagnosis present

## 2016-06-04 LAB — BLOOD GAS, ARTERIAL
Acid-base deficit: 1.7 mmol/L (ref 0.0–2.0)
Acid-base deficit: 0.7 mmol/L (ref 0.0–2.0)
BICARBONATE: 23.1 mmol/L (ref 20.0–28.0)
BICARBONATE: 24.4 mmol/L (ref 20.0–28.0)
DRAWN BY: 249101
Drawn by: 331001
FIO2: 60
FIO2: 40
LHR: 16 {breaths}/min
LHR: 16 {breaths}/min
MECHVT: 540 mL
O2 SAT: 98.3 %
O2 Saturation: 99.4 %
PATIENT TEMPERATURE: 98.6
PATIENT TEMPERATURE: 98.6
PCO2 ART: 47.1 mmHg (ref 32.0–48.0)
PEEP/CPAP: 5 cmH2O
PEEP: 5 cmH2O
PH ART: 7.334 — AB (ref 7.350–7.450)
PO2 ART: 244 mmHg — AB (ref 83.0–108.0)
VT: 540 mL
pCO2 arterial: 42.8 mmHg (ref 32.0–48.0)
pH, Arterial: 7.35 (ref 7.350–7.450)
pO2, Arterial: 134 mmHg — ABNORMAL HIGH (ref 83.0–108.0)

## 2016-06-04 LAB — I-STAT ARTERIAL BLOOD GAS, ED
Bicarbonate: 25.7 mmol/L (ref 20.0–28.0)
O2 SAT: 99 %
TCO2: 27 mmol/L (ref 0–100)
pCO2 arterial: 46.9 mmHg (ref 32.0–48.0)
pH, Arterial: 7.346 — ABNORMAL LOW (ref 7.350–7.450)
pO2, Arterial: 142 mmHg — ABNORMAL HIGH (ref 83.0–108.0)

## 2016-06-04 LAB — URINALYSIS, ROUTINE W REFLEX MICROSCOPIC
Bilirubin Urine: NEGATIVE
Glucose, UA: NEGATIVE mg/dL
HGB URINE DIPSTICK: NEGATIVE
Ketones, ur: NEGATIVE mg/dL
LEUKOCYTES UA: NEGATIVE
NITRITE: NEGATIVE
Protein, ur: NEGATIVE mg/dL
SPECIFIC GRAVITY, URINE: 1.006 (ref 1.005–1.030)
pH: 7 (ref 5.0–8.0)

## 2016-06-04 LAB — RAPID URINE DRUG SCREEN, HOSP PERFORMED
Amphetamines: NOT DETECTED
BENZODIAZEPINES: NOT DETECTED
Barbiturates: NOT DETECTED
Cocaine: NOT DETECTED
Opiates: NOT DETECTED
Tetrahydrocannabinol: NOT DETECTED

## 2016-06-04 LAB — COMPREHENSIVE METABOLIC PANEL
ALBUMIN: 4.3 g/dL (ref 3.5–5.0)
ALK PHOS: 37 U/L — AB (ref 38–126)
ALT: 17 U/L (ref 17–63)
AST: 30 U/L (ref 15–41)
Anion gap: 11 (ref 5–15)
BUN: 9 mg/dL (ref 6–20)
CALCIUM: 9.4 mg/dL (ref 8.9–10.3)
CO2: 24 mmol/L (ref 22–32)
CREATININE: 1.06 mg/dL (ref 0.61–1.24)
Chloride: 104 mmol/L (ref 101–111)
GFR calc Af Amer: >60 mL/min (ref >60)
GFR calc non Af Amer: >60 mL/min (ref >60)
GLUCOSE: 102 mg/dL — AB (ref 65–99)
Potassium: 4.1 mmol/L (ref 3.5–5.1)
Sodium: 139 mmol/L (ref 135–145)
Total Bilirubin: 0.4 mg/dL (ref 0.3–1.2)
Total Protein: 6.9 g/dL (ref 6.5–8.1)

## 2016-06-04 LAB — GLUCOSE, CAPILLARY
GLUCOSE-CAPILLARY: 108 mg/dL — AB (ref 65–99)
GLUCOSE-CAPILLARY: 122 mg/dL — AB (ref 65–99)
Glucose-Capillary: 91 mg/dL (ref 65–99)
Glucose-Capillary: 98 mg/dL (ref 65–99)
Glucose-Capillary: 117 mg/dL — ABNORMAL HIGH (ref 65–99)
Glucose-Capillary: 125 mg/dL — ABNORMAL HIGH (ref 65–99)

## 2016-06-04 LAB — BASIC METABOLIC PANEL
Anion gap: 9 (ref 5–15)
BUN: 8 mg/dL (ref 6–20)
CALCIUM: 8.5 mg/dL — AB (ref 8.9–10.3)
CHLORIDE: 114 mmol/L — AB (ref 101–111)
CO2: 23 mmol/L (ref 22–32)
CREATININE: 0.81 mg/dL (ref 0.61–1.24)
GFR calc non Af Amer: >60 mL/min (ref >60)
Glucose, Bld: 103 mg/dL — ABNORMAL HIGH (ref 65–99)
Potassium: 3.8 mmol/L (ref 3.5–5.1)
SODIUM: 146 mmol/L — AB (ref 135–145)

## 2016-06-04 LAB — MAGNESIUM: MAGNESIUM: 1.7 mg/dL (ref 1.7–2.4)

## 2016-06-04 LAB — DIFFERENTIAL
BASOS PCT: 0 %
Basophils Absolute: 0 10*3/uL (ref 0.0–0.1)
EOS ABS: 0.1 10*3/uL (ref 0.0–0.7)
EOS PCT: 2 %
Lymphocytes Relative: 53 %
Lymphs Abs: 2.6 10*3/uL (ref 0.7–4.0)
MONOS PCT: 7 %
Monocytes Absolute: 0.4 10*3/uL (ref 0.1–1.0)
NEUTROS PCT: 38 %
Neutro Abs: 1.9 10*3/uL (ref 1.7–7.7)

## 2016-06-04 LAB — CBC
HEMATOCRIT: 37.6 % — AB (ref 39.0–52.0)
HEMATOCRIT: 38.7 % — AB (ref 39.0–52.0)
Hemoglobin: 11.4 g/dL — ABNORMAL LOW (ref 13.0–17.0)
Hemoglobin: 12.4 g/dL — ABNORMAL LOW (ref 13.0–17.0)
MCH: 25.2 pg — AB (ref 26.0–34.0)
MCH: 26.4 pg (ref 26.0–34.0)
MCHC: 30.3 g/dL (ref 30.0–36.0)
MCHC: 32 g/dL (ref 30.0–36.0)
MCV: 82.5 fL (ref 78.0–100.0)
MCV: 83 fL (ref 78.0–100.0)
Platelets: 146 10*3/uL — ABNORMAL LOW (ref 150–400)
Platelets: 155 10*3/uL (ref 150–400)
RBC: 4.53 MIL/uL (ref 4.22–5.81)
RBC: 4.69 MIL/uL (ref 4.22–5.81)
RDW: 15.2 % (ref 11.5–15.5)
RDW: 15.5 % (ref 11.5–15.5)
WBC: 4.9 10*3/uL (ref 4.0–10.5)
WBC: 5 10*3/uL (ref 4.0–10.5)

## 2016-06-04 LAB — TRIGLYCERIDES: TRIGLYCERIDES: 57 mg/dL (ref <150)

## 2016-06-04 LAB — MRSA PCR SCREENING: MRSA BY PCR: NEGATIVE

## 2016-06-04 LAB — LACTIC ACID, PLASMA
Lactic Acid, Venous: 1.6 mmol/L (ref 0.5–1.9)
Lactic Acid, Venous: 1.8 mmol/L (ref 0.5–1.9)

## 2016-06-04 LAB — I-STAT CG4 LACTIC ACID, ED: LACTIC ACID, VENOUS: 1.2 mmol/L (ref 0.5–1.9)

## 2016-06-04 LAB — ETHANOL: Alcohol, Ethyl (B): <5 mg/dL (ref <5)

## 2016-06-04 LAB — PHOSPHORUS: PHOSPHORUS: 3.8 mg/dL (ref 2.5–4.6)

## 2016-06-04 MED ORDER — HEPARIN SODIUM (PORCINE) 5000 UNIT/ML IJ SOLN
5000.0000 [IU] | Freq: Three times a day (TID) | INTRAMUSCULAR | Status: DC
  Administered 2016-06-04 – 2016-06-12 (×25): 5000 [IU] via SUBCUTANEOUS
  Filled 2016-06-04 (×26): qty 1

## 2016-06-04 MED ORDER — LAMOTRIGINE 25 MG PO TABS
25.0000 mg | ORAL_TABLET | Freq: Every day | ORAL | Status: DC
  Administered 2016-06-04 – 2016-06-07 (×4): 25 mg
  Filled 2016-06-04 (×4): qty 1

## 2016-06-04 MED ORDER — PROPOFOL 1000 MG/100ML IV EMUL
0.0000 ug/kg/min | INTRAVENOUS | Status: DC
  Administered 2016-06-04: 10 ug/kg/min via INTRAVENOUS
  Administered 2016-06-04: 15 ug/kg/min via INTRAVENOUS
  Administered 2016-06-04 – 2016-06-05 (×2): 30 ug/kg/min via INTRAVENOUS
  Administered 2016-06-05: 40 ug/kg/min via INTRAVENOUS
  Administered 2016-06-05: 20 ug/kg/min via INTRAVENOUS
  Administered 2016-06-06: 35 ug/kg/min via INTRAVENOUS
  Administered 2016-06-06: 30 ug/kg/min via INTRAVENOUS
  Administered 2016-06-06: 40 ug/kg/min via INTRAVENOUS
  Administered 2016-06-06: 50 ug/kg/min via INTRAVENOUS
  Administered 2016-06-06: 35 ug/kg/min via INTRAVENOUS
  Administered 2016-06-07 (×2): 40 ug/kg/min via INTRAVENOUS
  Filled 2016-06-04 (×5): qty 100
  Filled 2016-06-04: qty 200
  Filled 2016-06-04 (×7): qty 100

## 2016-06-04 MED ORDER — LABETALOL HCL 5 MG/ML IV SOLN: 20.0000 mg | INTRAVENOUS | Status: DC | PRN

## 2016-06-04 MED ORDER — FENTANYL CITRATE (PF) 100 MCG/2ML IJ SOLN: 100.0000 ug | INTRAMUSCULAR | Status: DC | PRN

## 2016-06-04 MED ORDER — FENTANYL CITRATE (PF) 2500 MCG/50ML IJ SOLN
25.0000 ug/h | INTRAMUSCULAR | Status: DC
  Administered 2016-06-04 (×2): 50 ug/h via INTRAVENOUS
  Administered 2016-06-05 (×2): 200 ug/h via INTRAVENOUS
  Administered 2016-06-05: 250 ug/h via INTRAVENOUS
  Administered 2016-06-06 – 2016-06-07 (×3): 400 ug/h via INTRAVENOUS
  Filled 2016-06-04 (×8): qty 50

## 2016-06-04 MED ORDER — DEXAMETHASONE SODIUM PHOSPHATE 10 MG/ML IJ SOLN
6.0000 mg | Freq: Four times a day (QID) | INTRAMUSCULAR | Status: AC
  Administered 2016-06-04 – 2016-06-05 (×4): 6 mg via INTRAVENOUS
  Filled 2016-06-04 (×4): qty 0.6

## 2016-06-04 MED ORDER — MIDAZOLAM HCL 2 MG/2ML IJ SOLN
2.0000 mg | Freq: Once | INTRAMUSCULAR | Status: AC
  Administered 2016-06-04: 2 mg via INTRAVENOUS

## 2016-06-04 MED ORDER — FENTANYL CITRATE (PF) 100 MCG/2ML IJ SOLN
100.0000 ug | INTRAMUSCULAR | Status: DC | PRN
  Filled 2016-06-04: qty 2

## 2016-06-04 MED ORDER — MIDAZOLAM HCL 2 MG/2ML IJ SOLN
INTRAMUSCULAR | Status: AC
  Filled 2016-06-04: qty 2

## 2016-06-04 MED ORDER — CLONIDINE HCL 0.1 MG PO TABS
0.1000 mg | ORAL_TABLET | Freq: Two times a day (BID) | ORAL | Status: DC
  Administered 2016-06-05 – 2016-06-07 (×5): 0.1 mg
  Filled 2016-06-04 (×9): qty 1

## 2016-06-04 MED ORDER — PANTOPRAZOLE SODIUM 40 MG IV SOLR
40.0000 mg | Freq: Every day | INTRAVENOUS | Status: DC
  Administered 2016-06-04: 40 mg via INTRAVENOUS
  Filled 2016-06-04: qty 40

## 2016-06-04 MED ORDER — HYDRALAZINE HCL 20 MG/ML IJ SOLN
10.0000 mg | INTRAMUSCULAR | Status: DC | PRN
  Administered 2016-06-07 – 2016-06-08 (×2): 10 mg via INTRAVENOUS
  Administered 2016-06-08: 20 mg via INTRAVENOUS
  Filled 2016-06-04 (×3): qty 1

## 2016-06-04 MED ORDER — ASPIRIN 81 MG PO CHEW
81.0000 mg | CHEWABLE_TABLET | Freq: Every day | ORAL | Status: DC
  Administered 2016-06-04 – 2016-06-07 (×4): 81 mg
  Filled 2016-06-04 (×4): qty 1

## 2016-06-04 MED ORDER — FENTANYL CITRATE (PF) 100 MCG/2ML IJ SOLN: 50.0000 ug | Freq: Once | INTRAMUSCULAR | Status: DC

## 2016-06-04 MED ORDER — FENTANYL BOLUS VIA INFUSION
50.0000 ug | INTRAVENOUS | Status: DC | PRN
  Administered 2016-06-06 – 2016-06-07 (×2): 50 ug via INTRAVENOUS
  Filled 2016-06-04: qty 50

## 2016-06-04 MED ORDER — SODIUM CHLORIDE 0.9 % IV SOLN
INTRAVENOUS | Status: DC
  Administered 2016-06-04 (×2): via INTRAVENOUS

## 2016-06-04 MED ORDER — SODIUM CHLORIDE 0.9 % IV SOLN
3.0000 g | Freq: Four times a day (QID) | INTRAVENOUS | Status: DC
  Administered 2016-06-04 – 2016-06-09 (×22): 3 g via INTRAVENOUS
  Filled 2016-06-04 (×26): qty 3

## 2016-06-04 MED ORDER — FENTANYL CITRATE (PF) 100 MCG/2ML IJ SOLN
INTRAMUSCULAR | Status: AC
  Filled 2016-06-04: qty 2

## 2016-06-04 MED ORDER — SODIUM CHLORIDE 0.9 % IV SOLN
250.0000 mL | INTRAVENOUS | Status: DC | PRN
  Administered 2016-06-08: 250 mL via INTRAVENOUS

## 2016-06-04 MED ORDER — IOPAMIDOL (ISOVUE-370) INJECTION 76%
INTRAVENOUS | Status: AC
  Administered 2016-06-04: 50 mL via INTRAVENOUS
  Filled 2016-06-04: qty 50

## 2016-06-04 MED ORDER — LEVETIRACETAM 100 MG/ML PO SOLN: 1000.0000 mg | Freq: Two times a day (BID) | ORAL | Status: DC

## 2016-06-04 MED ORDER — FENTANYL CITRATE (PF) 100 MCG/2ML IJ SOLN
200.0000 ug | Freq: Once | INTRAMUSCULAR | Status: AC
  Administered 2016-06-04: 200 ug via INTRAVENOUS

## 2016-06-04 MED ORDER — LEVETIRACETAM 100 MG/ML PO SOLN
1000.0000 mg | Freq: Two times a day (BID) | ORAL | Status: DC
  Administered 2016-06-05 – 2016-06-07 (×5): 1000 mg
  Filled 2016-06-04 (×7): qty 10

## 2016-06-04 MED ORDER — INSULIN ASPART 100 UNIT/ML ~~LOC~~ SOLN
0.0000 [IU] | SUBCUTANEOUS | Status: DC
  Administered 2016-06-05 – 2016-06-10 (×5): 3 [IU] via SUBCUTANEOUS

## 2016-06-04 NOTE — Code Documentation (Signed)
Transported to CT 

## 2016-06-04 NOTE — Progress Notes (Signed)
Bedside EEG completed, results pending. 

## 2016-06-04 NOTE — Progress Notes (Signed)
Patient's next of kin is daughter, Earnstine RegalCaroline Jeffrey [phone]). Per Rayfield Citizenaroline, Patient's brother, Shanda BumpsMichael Holloman,  who is listed as emergency contact passed away from cancer. CSW has updated emergency contact information.    Enos FlingAshley Mesiah Manzo, MSW, LCSW [facility] Clinical Social Worker [phone]

## 2016-06-04 NOTE — Progress Notes (Signed)
Pharmacy Antibiotic Note  Terrance Cook is a 61 y.o. male admitted on 06/03/2016 with slurred speech, possible sz. Pt required intubation in ED and vomiting occurred. Pharmacy has been consulted for Unasyn dosing for asp pna. Estimated CrCL > 60 ml/min.  Plan: Unasyn 3gm IV q6h  Will f/u micro data, renal function, and pt's clinical condition   Height: 5\' 8"  (172.7 cm) IBW/kg (Calculated) : 68.4  No data recorded.   Recent Labs Lab 06/03/16 2337 06/03/16 2340 06/03/16 2347 06/04/16 0101  WBC  --  5.0  --   --   CREATININE 1.00  --  1.06  --   LATICACIDVEN  --   --   --  1.20    CrCl cannot be calculated (Unknown ideal weight.).    No Known Allergies  Antimicrobials this admission: 2/8 Unasyn >>   Microbiology results:  Sputum:     Thank you for allowing pharmacy to be a part of this patient's care.  Christoper Fabianaron Demetrias Goodbar, PharmD, BCPS Clinical pharmacist, pager [phone] 06/04/2016 1:28 AM

## 2016-06-04 NOTE — Progress Notes (Signed)
Patient is resident of [facility] This was confirmed by Patient's daughter and next of kin, Earnstine RegalCaroline Paden [phone]). Patient resides at facility due to a previous stroke with LLE deficit. CSW following for disposition and return to facility when medically appropriate.    Enos FlingAshley Zakarie Sturdivant, MSW, LCSW [facility] Clinical Social Worker [phone]

## 2016-06-04 NOTE — Progress Notes (Signed)
SLP Cancellation Note  Patient Details Name: Terrance Cook MRN: [id number] DOB: Sep 14, 1955   Cancelled treatment:       Reason Eval/Treat Not Completed: Medical issues which prohibited therapy (Patient intubated. Signing off. )  Terrance LangoLeah Dasiah Hooley MA, CCC-SLP [phone]  Terrance Cook 06/04/2016, 9:46 AM

## 2016-06-04 NOTE — Progress Notes (Signed)
Terrance Cook is a 61 yo male who presents [facility] via [facility] as a code stroke, pt currently resides at SNF due to a previous stroke with LLE deficit. He was LKW at 2045. Staff reported he quit playing cards went back to his room and was noted to have new onset of slurred speech. He was a/o x4 en route, talking and answering questions appropriately with EMS until 2245 when he became unresponsive with snoring respirations. Pt given Narcan 1.4 mg for possible opiate overdose with mild improvement. Pt intubated prior to transport to CT for airway protection. Pt admitted to PCCM.

## 2016-06-04 NOTE — Progress Notes (Signed)
Pt transferred to 2M2 without complications. Pt transported on 100% FiO2. Uneventful trip.

## 2016-06-04 NOTE — Progress Notes (Signed)
PULMONARY / CRITICAL CARE MEDICINE   Name: Tereso Cook MRN: [id number] DOB: May 01, 1955    ADMISSION DATE:  06/03/2016 CONSULTATION DATE:  06/04/16  REFERRING MD:  Otelia Limes  CHIEF COMPLAINT:  AMS  HISTORY OF PRESENT ILLNESS:  Pt is encephelopathic; therefore, this HPI is obtained from chart review. Terrance Cook is a 61 y.o. male with PMH as outlined below. 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  He was apparently playing cards and then went back to his room when done.  He was then found in his room with slurred speech.  EMS was called and upon their arrival, he was altered and had left leg weakness (though this is reportedly old from prior stroke). He was given 1.4mg  narcan and initially responded but then became altered again.  He received a second dose of narcan in the ED but had no response.  In ED, his mental status worsened and he had sonorous respirations with questionable airway protection.  He was subsequently intubated for airway protection.  He apparently had projectile vomiting during intubation. 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  Of note he also has a hx of seizure disorder and is on keppra and lamictal but no known recent seizures.  Family informed neuro that pt has been known to take a lot of opiates and in the past, he has had slurred speech from this.  PCCM was called for admission.   PAST MEDICAL HISTORY :  He  has a past medical history of Anxiety (01/04/2015); CAD (coronary artery disease) (01/04/2015); Chronic pain (01/04/2015); Coronary artery disease; Diabetes mellitus without complication (HCC); H/O: CVA (cerebrovascular accident) (01/04/2015); History of cardiac cath; Hyperlipidemia (01/04/2015); Hypertension; MDD (major depressive disorder) (01/04/2015); Schizoaffective disorder (HCC) (01/04/2015); Seizure disorder:  Secondary to CVA (01/04/2015); Stroke [facility]); and Tobacco use (01/04/2015).   SUBJECTIVE:  Self extubated just now, paradoxial   VITAL SIGNS: BP 132/74   Pulse (!) 53   Temp 98.1 F (36.7 C) (Oral)   Resp 16   Ht 5\' 8"  (1.727 m)   Wt 203 lb 7.8 oz (92.3 kg)   SpO2 100%   BMI 30.94 kg/m   HEMODYNAMICS:    VENTILATOR SETTINGS: Vent Mode: PRVC FiO2 (%):  [40 %-60 %] 40 % Set Rate:  [16 bmp] 16 bmp Vt Set:  [540 mL] 540 mL PEEP:  [5 cmH20] 5 cmH20 Plateau Pressure:  [13 cmH20-14 cmH20] 13 cmH20  INTAKE / OUTPUT: I/O last 3 completed shifts: In: 3233.1 [I.V.:3133.1; IV Piggyback:100] Out: 950 [Urine:950]   PHYSICAL EXAMINATION: General: Middle aged male, in distress now Neuro: Sedated, opens his eyes to commands.  HEENT: [place]/AT. Pupils pinpoint, Cardiovascular: RRR, no M/R/G.  Lungs:  CTA bilaterally. Mouth breathing, distress, paradoxical  Abdomen: Obese, BS x 4, soft, NT/ND.  Musculoskeletal: No gross deformities, LLE 2+ pitting edema. Skin: Intact, warm, no rashes.  LABS:  BMET  Recent Labs Lab 06/03/16 2337 06/03/16 2347 06/04/16 0301  NA 140 139 146*  K 3.9 4.1 3.8  CL 107 104 114*  CO2  --  24 23  BUN 12 9 8   CREATININE 1.00 1.06 0.81  GLUCOSE 93 102* 103*    Electrolytes  Recent Labs Lab 06/03/16 2347 06/04/16 0301  CALCIUM 9.4 8.5*  MG  --  1.7  PHOS  --  3.8    CBC  Recent Labs Lab 06/03/16 2337 06/03/16 2340 06/04/16 0301  WBC  --  5.0 4.9  HGB 13.3 12.4* 11.4*  HCT 39.0 38.7* 37.6*  PLT  --  146* 155    Coag's  Recent Labs Lab 06/03/16 2333  APTT 28  INR 1.09    Sepsis Markers  Recent Labs Lab 06/04/16 0101 06/04/16 0327  LATICACIDVEN 1.20 1.6    ABG  Recent Labs Lab 06/04/16 0110 06/04/16 0439  PHART 7.346* 7.350  PCO2ART 46.9 42.8  PO2ART 142.0* 244*    Liver Enzymes  Recent Labs Lab 06/03/16 2347  AST 30  ALT 17  ALKPHOS 37*  BILITOT 0.4  ALBUMIN 4.3    Cardiac Enzymes No results for  input(s): TROPONINI, PROBNP in the last 168 hours.  Glucose  Recent Labs Lab 06/03/16 2336 06/04/16 0304  GLUCAP 112* 91    Imaging Ct Angio Head W Or Wo Contrast  Result Date: 06/04/2016 CLINICAL DATA:  Slurred speech. EXAM: CT ANGIOGRAPHY HEAD AND NECK TECHNIQUE: Multidetector CT imaging of the head and neck was performed using the standard protocol during bolus administration of intravenous contrast. Multiplanar CT image reconstructions and MIPs were obtained to evaluate the vascular anatomy. Carotid stenosis measurements (when applicable) are obtained utilizing NASCET criteria, using the distal internal carotid diameter as the denominator. CONTRAST:  50 cc Isovue 370 COMPARISON:  06/04/2016 CT head. 08/08/2014 CT angiogram head and neck. FINDINGS: CTA NECK FINDINGS Aortic arch: Calcific atherosclerosis. Bovine arch, normal variant. No aneurysm or dissection identified. Right carotid system: No evidence of dissection, stenosis (50% or greater) or occlusion. Calcified plaque of the carotid bifurcation with mild less than 50% stenosis. Left carotid system: No evidence of dissection, stenosis (50% or greater) or occlusion. Calcified plaque of the carotid bifurcation with mild less than 50% stenosis. Vertebral arteries: Left dominant. No evidence of dissection, stenosis (50% or greater) or occlusion. Skeleton: Straightening of cervical lordosis from C2 through C5 and exaggerated lordosis from C5 through C7. Mild discogenic and facet degenerative changes. No high-grade bony canal stenosis. Other neck: Endotracheal tube tip approximately 1.6 cm from the carina. Enteric tube tip extends below the field of view into the abdomen. Debris within the nasal and oropharynx is likely due to intubation. Upper chest: Smooth septal thickening probably represents interstitial pulmonary edema. Small bilateral pleural effusions. Review of the MIP images confirms the above findings CTA HEAD FINDINGS Anterior circulation:  .Calcific atherosclerosis of cavernous and paraclinoid internal carotid arteries bilaterally without significant stenosis. Patent bilateral M1 and distal MCA circulation. Patent bilateral A1 and A2 segments with diminutive caliber of distal right ACA circulation likely related to chronic infarct. Posterior circulation: Left V4 multiple foci of calcified plaque with mild stenosis. Basilar irregularity with mild mid segment stenosis compatible with atherosclerosis. Multiple segments of mild-to-moderate stenosis of bilateral P1 and P2 segments. Venous sinuses: As permitted by contrast timing, patent. Anatomic variants: Diminutive right posterior communicating artery. No left posterior communicating artery or anterior communicating artery identified, likely hypoplastic or absent. Review of the MIP images confirms the above findings IMPRESSION: 1. Patent carotid and vertebral arteries of the neck. 2. Stable calcified plaque of the carotid bifurcations with mild less than 50% stenosis. 3. Patent circle of Willis with no high-grade stenosis, proximal occlusion, aneurysm, or vascular malformation identified 4. Intracranial atherosclerosis with multiple areas of mild-to-moderate stenosis most pronounced in the bilateral posterior cerebral arteries. 5. Diminutive right distal ACA circulation likely related to prior infarction. These results  were called by telephone at the time of interpretation on 06/04/2016 at 12:46 am to Dr. Manus Gunning who verbally acknowledged these results. Electronically Signed   By: Mitzi Hansen M.D.   On: 06/04/2016 00:58   Ct Angio Neck W Or Wo Contrast  Result Date: 06/04/2016 CLINICAL DATA:  Slurred speech. EXAM: CT ANGIOGRAPHY HEAD AND NECK TECHNIQUE: Multidetector CT imaging of the head and neck was performed using the standard protocol during bolus administration of intravenous contrast. Multiplanar CT image reconstructions and MIPs were obtained to evaluate the vascular anatomy.  Carotid stenosis measurements (when applicable) are obtained utilizing NASCET criteria, using the distal internal carotid diameter as the denominator. CONTRAST:  50 cc Isovue 370 COMPARISON:  06/04/2016 CT head. 08/08/2014 CT angiogram head and neck. FINDINGS: CTA NECK FINDINGS Aortic arch: Calcific atherosclerosis. Bovine arch, normal variant. No aneurysm or dissection identified. Right carotid system: No evidence of dissection, stenosis (50% or greater) or occlusion. Calcified plaque of the carotid bifurcation with mild less than 50% stenosis. Left carotid system: No evidence of dissection, stenosis (50% or greater) or occlusion. Calcified plaque of the carotid bifurcation with mild less than 50% stenosis. Vertebral arteries: Left dominant. No evidence of dissection, stenosis (50% or greater) or occlusion. Skeleton: Straightening of cervical lordosis from C2 through C5 and exaggerated lordosis from C5 through C7. Mild discogenic and facet degenerative changes. No high-grade bony canal stenosis. Other neck: Endotracheal tube tip approximately 1.6 cm from the carina. Enteric tube tip extends below the field of view into the abdomen. Debris within the nasal and oropharynx is likely due to intubation. Upper chest: Smooth septal thickening probably represents interstitial pulmonary edema. Small bilateral pleural effusions. Review of the MIP images confirms the above findings CTA HEAD FINDINGS Anterior circulation: .Calcific atherosclerosis of cavernous and paraclinoid internal carotid arteries bilaterally without significant stenosis. Patent bilateral M1 and distal MCA circulation. Patent bilateral A1 and A2 segments with diminutive caliber of distal right ACA circulation likely related to chronic infarct. Posterior circulation: Left V4 multiple foci of calcified plaque with mild stenosis. Basilar irregularity with mild mid segment stenosis compatible with atherosclerosis. Multiple segments of mild-to-moderate stenosis  of bilateral P1 and P2 segments. Venous sinuses: As permitted by contrast timing, patent. Anatomic variants: Diminutive right posterior communicating artery. No left posterior communicating artery or anterior communicating artery identified, likely hypoplastic or absent. Review of the MIP images confirms the above findings IMPRESSION: 1. Patent carotid and vertebral arteries of the neck. 2. Stable calcified plaque of the carotid bifurcations with mild less than 50% stenosis. 3. Patent circle of Willis with no high-grade stenosis, proximal occlusion, aneurysm, or vascular malformation identified 4. Intracranial atherosclerosis with multiple areas of mild-to-moderate stenosis most pronounced in the bilateral posterior cerebral arteries. 5. Diminutive right distal ACA circulation likely related to prior infarction. These results were called by telephone at the time of interpretation on 06/04/2016 at 12:46 am to Dr. Manus Gunning who verbally acknowledged these results. Electronically Signed   By: Mitzi Hansen M.D.   On: 06/04/2016 00:58   Dg Chest Portable 1 View  Result Date: 06/04/2016 CLINICAL DATA:  Intubation.  Unresponsive. EXAM: PORTABLE CHEST 1 VIEW COMPARISON:  06/03/2015 FINDINGS: The endotracheal tube is been placed with tip measuring 3 cm above the carina. Shallow inspiration with atelectasis in the lung bases. Increased opacity in the left perihilar region may be due to vascular crowding because of shallow inspiration but early infiltration or edema is not excluded. No blunting of costophrenic angles. No pneumothorax. Tortuous aorta.  IMPRESSION: Endotracheal tube appears in satisfactory position. Shallow inspiration with atelectasis in the lung bases. Increased left perihilar density may be due to shallow inspiration or may indicate early infiltrate/edema. Electronically Signed   By: Burman Nieves M.D.   On: 06/04/2016 00:18   Ct Head Code Stroke W/o Cm  Result Date: 06/04/2016 CLINICAL DATA:   Code stroke. EXAM: CT HEAD WITHOUT CONTRAST TECHNIQUE: Contiguous axial images were obtained from the base of the skull through the vertex without intravenous contrast. COMPARISON:  08/08/2014 CT head FINDINGS: Brain: Stable chronic right ACA distribution encephalomalacia from prior infarction. Stable chronic microvascular ischemic changes of white matter. No large acute infarct, focal mass effect, hydrocephalus, or intracranial hemorrhage identified. Vascular: Mild calcific atherosclerosis of cavernous internal carotid arteries bilaterally. Skull: No displaced calvarial fracture. Sinuses/Orbits: Mucosal thickening within ethmoid sinus is probably due to intubation. Orbits are unremarkable. Other: None. ASPECTS [facility] Stroke Program Early CT Score) - Ganglionic level infarction (caudate, lentiform nuclei, internal capsule, insula, M1-M3 cortex): 7 - Supraganglionic infarction (M4-M6 cortex): 3 Total score (0-10 with 10 being normal): 10 IMPRESSION: 1. No acute intracranial abnormality identified. 2. ASPECTS is 10 3. 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 Electronically Signed   By: Mitzi Hansen M.D.   On: 06/04/2016 00:27     STUDIES:  CT head 02/08 > negative for acute process.  Stable chronic right ACA infarct. CT  Angio 02/09 > Intracranial atherosclerosis with multiple areas of mild-to-moderate stenosis most pronounced in the bilateral posterior;Diminutive right distal ACA circulation likely related to prior Infarction. cerebral arteries. LE duplex 02/09 > EEG 02/09 >   CULTURES: Sputum 02/08 >  ANTIBIOTICS: Unasyn 02/08 >   SIGNIFICANT EVENTS: 02/08 > admit.  LINES/TUBES: ETT 02/08 >  DISCUSSION: 61 y.o. male from SNF due to old right ACA infarct, now admitted with AMS and required intubation in ED for airway protection.  Initial head CT negative for hemorrhage.  DDx acute infarct vs seizures vs opiate overdose (has been  known to take a lot of opiates - only responded to 1 dose narcan with EMS then AMS worsened).  ASSESSMENT / PLAN:  NEUROLOGIC A:   Acute encephalopathy - unclear etiology at this point.  Initial head CT negative for hemorrhage.  DDx includes acute infarction, seizures, overdose (has been known to take a lot of opiates - responded once to narcan with EMS but then mental status worsened in ED).  UDS in the past has been positive for benzo's, opiates, THC. Hx CVA, seizures, schizoaffective disorder, MDD, anxiety, chronic pain. UDS and Ethanol this admission negative   P:   Sedation:  Propofol gtt / Fentanyl PRN- now off RASS goal: 0 to -1. Neuro following. EEG pending  Consider MRI when possibly  Consider drug screen  Continue preadmission lamotrigine, levetiracetam. Hold preadmission paroxetine, duloxetine, gabapentin, percocet, quetiapine, sertraline.   PULMONARY A: VDRF - due to inability to protect the airway. Concern for aspiration PNA - had projectile vomiting during intubation. Tobacco dependence. Self extubated, some acute distress  P:   May need ett again Abx / cultures per ID section.  CXR  Pending   CARDIOVASCULAR A:  Hx HTN, HLD, CAD. P:  Monitor hemodynamics. Continue preadmission clonidine, ASA. Labetalol PRN. Hold preadmission amlodipine, atorvastatin, carvedilol, fenofibrate, lisinopril, nitro.Marland Kitchen  RENAL A:   No acute issues. P:   NS @ 75.  BMP in AM.  GASTROINTESTINAL A:   GI prophylaxis. Nutrition. P:   Continue preadmission  PPI. NPO.  HEMATOLOGIC A:   R/o DVT - LLE has 2+ pitting edema (no edema to RLE). VTE Prophylaxis. P:  Consider a CT angio if breathing not improved  LE duplex pending  SCD's / heparin. CBC in AM.  INFECTIOUS A:   Concern for aspiration PNA - had projectile vomiting during intubation. P:   Abx as above (unasyn).  Follow cultures as above.  ENDOCRINE A:   Hx DM.   P:   SSI. Hold preadmission  metformin.   STAFF NOTE: I, Rory Percyaniel Danya Spearman, MD FACP have personally reviewed patient's available data, including medical history, events of note, physical examination and test results as part of my evaluation. I have discussed with resident/NP and other care providers such as pharmacist, RN and RRT. In addition, I personally evaluated patient and elicited key findings of: self extubated, ronchi, tongue swelling , int airway obstruction, aspiration infiltrates on pcxr, needs MRI brain, requires re intubation now, ronchi on examination, not following commands, etiology unclear, r/o acuite cva, plan reintubation, pcxr to follow re sedation MRI brain, eeg needed, pcxr in am , maintain unasyn but he is from SNF so low threhsold to change to zosyn for nosocomial exposure, K supp, allow nA to rise until we assess mri brain  The patient is critically ill with multiple organ systems failure and requires high complexity decision making for assessment and support, frequent evaluation and titration of therapies, application of advanced monitoring technologies and extensive interpretation of multiple databases.   Critical Care Time devoted to patient care services described in this note is 35 Minutes. This time reflects time of care of this signee: Rory Percyaniel Kristelle Cavallaro, MD FACP. This critical care time does not reflect procedure time, or teaching time or supervisory time of PA/NP/Med student/Med Resident etc but could involve care discussion time. Rest per NP/medical resident whose note is outlined above and that I agree with   Mcarthur Rossettianiel J. Tyson AliasFeinstein, MD, FACP Pgr: [phone] [facility] 06/04/2016 8:29 AM

## 2016-06-04 NOTE — H&P (Signed)
PULMONARY / CRITICAL CARE MEDICINE   Name: Terrance Cook MRN: [id number] DOB: 1955/07/27    ADMISSION DATE:  06/03/2016 CONSULTATION DATE:  06/04/16  REFERRING MD:  Otelia Limes  CHIEF COMPLAINT:  AMS  HISTORY OF PRESENT ILLNESS:  Pt is encephelopathic; therefore, this HPI is obtained from chart review. Terrance Cook is a 61 y.o. male with PMH as outlined below. 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  He was apparently playing cards and then went back to his room when done.  He was then found in his room with slurred speech.  EMS was called and upon their arrival, he was altered and had left leg weakness (though this is reportedly old from prior stroke). He was given 1.4mg  narcan and initially responded but then became altered again.  He received a second dose of narcan in the ED but had no response.  In ED, his mental status worsened and he had sonorous respirations with questionable airway protection.  He was subsequently intubated for airway protection.  He apparently had projectile vomiting during intubation. 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  Of note he also has a hx of seizure disorder and is on keppra and lamictal but no known recent seizures.  Family informed neuro that pt has been known to take a lot of opiates and in the past, he has had slurred speech from this.  PCCM was called for admission.   PAST MEDICAL HISTORY :  He  has a past medical history of Anxiety (01/04/2015); CAD (coronary artery disease) (01/04/2015); Chronic pain (01/04/2015); Coronary artery disease; Diabetes mellitus without complication (HCC); H/O: CVA (cerebrovascular accident) (01/04/2015); History of cardiac cath; Hyperlipidemia (01/04/2015); Hypertension; MDD (major depressive disorder) (01/04/2015); Schizoaffective disorder (HCC) (01/04/2015); Seizure disorder:  Secondary to CVA (01/04/2015); Stroke [facility]); and Tobacco use (01/04/2015).  PAST SURGICAL HISTORY: He  has a past surgical history that includes Cardiac surgery; Femur fracture surgery; Femur IM nail (Left, 08/15/2014); and Hardware Removal (Left, 08/15/2014).  No Known Allergies  No current facility-administered medications on file prior to encounter.    Current Outpatient Prescriptions on File Prior to Encounter  Medication Sig  . acetaminophen (TYLENOL) 325 MG tablet Take 650 mg by mouth every 6 (six) hours as needed for fever (pain).  Marland Kitchen amLODipine (NORVASC) 10 MG tablet Take 10 mg by mouth daily.  Marland Kitchen aspirin 81 MG chewable tablet Chew 81 mg by mouth daily.  Marland Kitchen atorvastatin (LIPITOR) 20 MG tablet Take 20 mg by mouth daily at 6 PM.   . bisacodyl (DULCOLAX) 5 MG EC tablet Take 1 tablet (5 mg total) by mouth daily as needed for moderate constipation.  . carvedilol (COREG) 3.125 MG tablet Take 1 tablet (3.125 mg total) by mouth 2 (two) times daily with a meal.  . cloNIDine (CATAPRES) 0.1 MG tablet Take 0.1 mg by mouth 2 (two) times daily.  . diclofenac sodium (VOLTAREN) 1 % GEL Apply 2 g topically 2 (two) times daily. Apply to lower back, left hip, and left knee  . DULoxetine (CYMBALTA) 60 MG capsule Take 60 mg by mouth daily.  . febuxostat (ULORIC) 40 MG tablet Take 40 mg by mouth daily.  . fenofibrate 160 MG tablet Take 160 mg by mouth daily.  Marland Kitchen gabapentin (NEURONTIN) 800 MG tablet Take 800 mg by mouth 2 (two)  times daily.   Marland Kitchen lamoTRIgine (LAMICTAL) 25 MG tablet Take 25 mg by mouth every morning.   . levETIRAcetam (KEPPRA) 500 MG tablet Take 1,000 mg by mouth 2 (two) times daily.  Marland Kitchen lisinopril (PRINIVIL,ZESTRIL) 10 MG tablet Take 10 mg by mouth daily.  . metFORMIN (GLUCOPHAGE) 1000 MG tablet Take 1,000 mg by mouth 2 (two) times daily with a meal.  . nicotine (NICODERM CQ - DOSED IN MG/24 HOURS) 21 mg/24hr patch Place 1 patch (21 mg total) onto the skin daily.  . nitroGLYCERIN (NITROSTAT) 0.4 MG  SL tablet Place 0.4 mg under the tongue every 5 (five) minutes as needed for chest pain.  Marland Kitchen nystatin cream (MYCOSTATIN) Apply 1 application topically 2 (two) times daily as needed (groin redness/itching).  Marland Kitchen oxybutynin (DITROPAN-XL) 5 MG 24 hr tablet Take 5 mg by mouth at bedtime.  . Oxycodone HCl 10 MG TABS Take 10 mg by mouth 3 (three) times daily.  Marland Kitchen oxyCODONE-acetaminophen (PERCOCET) 5-325 MG tablet Take 1 tablet by mouth every 6 (six) hours as needed for severe pain.  . pantoprazole (PROTONIX) 40 MG tablet Take 1 tablet (40 mg total) by mouth daily at 6 (six) AM.  . QUEtiapine (SEROQUEL) 25 MG tablet Take 25 mg by mouth at bedtime.  . sertraline (ZOLOFT) 50 MG tablet Take 50 mg by mouth daily.    FAMILY HISTORY:  His indicated that his mother is deceased. He indicated that his father is deceased.    SOCIAL HISTORY: He  reports that he has been smoking Cigarettes.  He has a 22.50 pack-year smoking history. He has never used smokeless tobacco. He reports that he does not drink alcohol or use drugs.  REVIEW OF SYSTEMS:  Unable to obtain as pt is encephalopathic.  SUBJECTIVE:  On vent, unresponsive.  VITAL SIGNS: BP 154/95   Pulse 82   Resp 21   Ht 5\' 8"  (1.727 m)   SpO2 100%   HEMODYNAMICS:    VENTILATOR SETTINGS: Vent Mode: PRVC FiO2 (%):  [60 %] 60 % Set Rate:  [16 bmp] 16 bmp Vt Set:  [540 mL] 540 mL PEEP:  [5 cmH20] 5 cmH20 Plateau Pressure:  [13 cmH20] 13 cmH20  INTAKE / OUTPUT: No intake/output data recorded.   PHYSICAL EXAMINATION: General: Middle aged male, in NAD. Neuro: Sedated, non-responsive. HEENT: [place]/AT. Pupils pinpoint, sclerae anicteric. Cardiovascular: RRR, no M/R/G.  Lungs: Respirations even and unlabored.  CTA bilaterally, No W/R/R. Abdomen: Obese, BS x 4, soft, NT/ND.  Musculoskeletal: No gross deformities, LLE 2+ pitting edema. Skin: Intact, warm, no rashes.  LABS:  BMET  Recent Labs Lab 06/03/16 2337 06/03/16 2347  NA 140 139  K  3.9 4.1  CL 107 104  CO2  --  24  BUN 12 9  CREATININE 1.00 1.06  GLUCOSE 93 102*    Electrolytes  Recent Labs Lab 06/03/16 2347  CALCIUM 9.4    CBC  Recent Labs Lab 06/03/16 2337 06/03/16 2340  WBC  --  5.0  HGB 13.3 12.4*  HCT 39.0 38.7*  PLT  --  146*    Coag's  Recent Labs Lab 06/03/16 2333  APTT 28  INR 1.09    Sepsis Markers  Recent Labs Lab 06/04/16 0101  LATICACIDVEN 1.20    ABG No results for input(s): PHART, PCO2ART, PO2ART in the last 168 hours.  Liver Enzymes  Recent Labs Lab 06/03/16 2347  AST 30  ALT 17  ALKPHOS 37*  BILITOT 0.4  ALBUMIN 4.3  Cardiac Enzymes No results for input(s): TROPONINI, PROBNP in the last 168 hours.  Glucose  Recent Labs Lab 06/03/16 2336  GLUCAP 112*    Imaging Ct Angio Head W Or Wo Contrast  Result Date: 06/04/2016 CLINICAL DATA:  Slurred speech. EXAM: CT ANGIOGRAPHY HEAD AND NECK TECHNIQUE: Multidetector CT imaging of the head and neck was performed using the standard protocol during bolus administration of intravenous contrast. Multiplanar CT image reconstructions and MIPs were obtained to evaluate the vascular anatomy. Carotid stenosis measurements (when applicable) are obtained utilizing NASCET criteria, using the distal internal carotid diameter as the denominator. CONTRAST:  50 cc Isovue 370 COMPARISON:  06/04/2016 CT head. 08/08/2014 CT angiogram head and neck. FINDINGS: CTA NECK FINDINGS Aortic arch: Calcific atherosclerosis. Bovine arch, normal variant. No aneurysm or dissection identified. Right carotid system: No evidence of dissection, stenosis (50% or greater) or occlusion. Calcified plaque of the carotid bifurcation with mild less than 50% stenosis. Left carotid system: No evidence of dissection, stenosis (50% or greater) or occlusion. Calcified plaque of the carotid bifurcation with mild less than 50% stenosis. Vertebral arteries: Left dominant. No evidence of dissection, stenosis (50% or  greater) or occlusion. Skeleton: Straightening of cervical lordosis from C2 through C5 and exaggerated lordosis from C5 through C7. Mild discogenic and facet degenerative changes. No high-grade bony canal stenosis. Other neck: Endotracheal tube tip approximately 1.6 cm from the carina. Enteric tube tip extends below the field of view into the abdomen. Debris within the nasal and oropharynx is likely due to intubation. Upper chest: Smooth septal thickening probably represents interstitial pulmonary edema. Small bilateral pleural effusions. Review of the MIP images confirms the above findings CTA HEAD FINDINGS Anterior circulation: .Calcific atherosclerosis of cavernous and paraclinoid internal carotid arteries bilaterally without significant stenosis. Patent bilateral M1 and distal MCA circulation. Patent bilateral A1 and A2 segments with diminutive caliber of distal right ACA circulation likely related to chronic infarct. Posterior circulation: Left V4 multiple foci of calcified plaque with mild stenosis. Basilar irregularity with mild mid segment stenosis compatible with atherosclerosis. Multiple segments of mild-to-moderate stenosis of bilateral P1 and P2 segments. Venous sinuses: As permitted by contrast timing, patent. Anatomic variants: Diminutive right posterior communicating artery. No left posterior communicating artery or anterior communicating artery identified, likely hypoplastic or absent. Review of the MIP images confirms the above findings IMPRESSION: 1. Patent carotid and vertebral arteries of the neck. 2. Stable calcified plaque of the carotid bifurcations with mild less than 50% stenosis. 3. Patent circle of Willis with no high-grade stenosis, proximal occlusion, aneurysm, or vascular malformation identified 4. Intracranial atherosclerosis with multiple areas of mild-to-moderate stenosis most pronounced in the bilateral posterior cerebral arteries. 5. Diminutive right distal ACA circulation likely  related to prior infarction. These results were called by telephone at the time of interpretation on 06/04/2016 at 12:46 am to Dr. Manus Gunningancour who verbally acknowledged these results. Electronically Signed   By: Mitzi HansenLance  Furusawa-Stratton M.D.   On: 06/04/2016 00:58   Ct Angio Neck W Or Wo Contrast  Result Date: 06/04/2016 CLINICAL DATA:  Slurred speech. EXAM: CT ANGIOGRAPHY HEAD AND NECK TECHNIQUE: Multidetector CT imaging of the head and neck was performed using the standard protocol during bolus administration of intravenous contrast. Multiplanar CT image reconstructions and MIPs were obtained to evaluate the vascular anatomy. Carotid stenosis measurements (when applicable) are obtained utilizing NASCET criteria, using the distal internal carotid diameter as the denominator. CONTRAST:  50 cc Isovue 370 COMPARISON:  06/04/2016 CT head. 08/08/2014 CT angiogram head  and neck. FINDINGS: CTA NECK FINDINGS Aortic arch: Calcific atherosclerosis. Bovine arch, normal variant. No aneurysm or dissection identified. Right carotid system: No evidence of dissection, stenosis (50% or greater) or occlusion. Calcified plaque of the carotid bifurcation with mild less than 50% stenosis. Left carotid system: No evidence of dissection, stenosis (50% or greater) or occlusion. Calcified plaque of the carotid bifurcation with mild less than 50% stenosis. Vertebral arteries: Left dominant. No evidence of dissection, stenosis (50% or greater) or occlusion. Skeleton: Straightening of cervical lordosis from C2 through C5 and exaggerated lordosis from C5 through C7. Mild discogenic and facet degenerative changes. No high-grade bony canal stenosis. Other neck: Endotracheal tube tip approximately 1.6 cm from the carina. Enteric tube tip extends below the field of view into the abdomen. Debris within the nasal and oropharynx is likely due to intubation. Upper chest: Smooth septal thickening probably represents interstitial pulmonary edema. Small  bilateral pleural effusions. Review of the MIP images confirms the above findings CTA HEAD FINDINGS Anterior circulation: .Calcific atherosclerosis of cavernous and paraclinoid internal carotid arteries bilaterally without significant stenosis. Patent bilateral M1 and distal MCA circulation. Patent bilateral A1 and A2 segments with diminutive caliber of distal right ACA circulation likely related to chronic infarct. Posterior circulation: Left V4 multiple foci of calcified plaque with mild stenosis. Basilar irregularity with mild mid segment stenosis compatible with atherosclerosis. Multiple segments of mild-to-moderate stenosis of bilateral P1 and P2 segments. Venous sinuses: As permitted by contrast timing, patent. Anatomic variants: Diminutive right posterior communicating artery. No left posterior communicating artery or anterior communicating artery identified, likely hypoplastic or absent. Review of the MIP images confirms the above findings IMPRESSION: 1. Patent carotid and vertebral arteries of the neck. 2. Stable calcified plaque of the carotid bifurcations with mild less than 50% stenosis. 3. Patent circle of Willis with no high-grade stenosis, proximal occlusion, aneurysm, or vascular malformation identified 4. Intracranial atherosclerosis with multiple areas of mild-to-moderate stenosis most pronounced in the bilateral posterior cerebral arteries. 5. Diminutive right distal ACA circulation likely related to prior infarction. These results were called by telephone at the time of interpretation on 06/04/2016 at 12:46 am to Dr. Manus Gunning who verbally acknowledged these results. Electronically Signed   By: Mitzi Hansen M.D.   On: 06/04/2016 00:58   Dg Chest Portable 1 View  Result Date: 06/04/2016 CLINICAL DATA:  Intubation.  Unresponsive. EXAM: PORTABLE CHEST 1 VIEW COMPARISON:  06/03/2015 FINDINGS: The endotracheal tube is been placed with tip measuring 3 cm above the carina. Shallow inspiration  with atelectasis in the lung bases. Increased opacity in the left perihilar region may be due to vascular crowding because of shallow inspiration but early infiltration or edema is not excluded. No blunting of costophrenic angles. No pneumothorax. Tortuous aorta. IMPRESSION: Endotracheal tube appears in satisfactory position. Shallow inspiration with atelectasis in the lung bases. Increased left perihilar density may be due to shallow inspiration or may indicate early infiltrate/edema. Electronically Signed   By: Burman Nieves M.D.   On: 06/04/2016 00:18   Ct Head Code Stroke W/o Cm  Result Date: 06/04/2016 CLINICAL DATA:  Code stroke. EXAM: CT HEAD WITHOUT CONTRAST TECHNIQUE: Contiguous axial images were obtained from the base of the skull through the vertex without intravenous contrast. COMPARISON:  08/08/2014 CT head FINDINGS: Brain: Stable chronic right ACA distribution encephalomalacia from prior infarction. Stable chronic microvascular ischemic changes of white matter. No large acute infarct, focal mass effect, hydrocephalus, or intracranial hemorrhage identified. Vascular: Mild calcific atherosclerosis of cavernous internal carotid  arteries bilaterally. Skull: No displaced calvarial fracture. Sinuses/Orbits: Mucosal thickening within ethmoid sinus is probably due to intubation. Orbits are unremarkable. Other: None. ASPECTS [facility] Early CT Score) - Ganglionic level infarction (caudate, lentiform nuclei, internal capsule, insula, M1-M3 cortex): 7 - Supraganglionic infarction (M4-M6 cortex): 3 Total score (0-10 with 10 being normal): 10 IMPRESSION: 1. No acute intracranial abnormality identified. 2. ASPECTS is 10 3. 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 Electronically Signed   By: Mitzi Hansen M.D.   On: 06/04/2016 00:27     STUDIES:  CT head 02/08 > negative for acute process.  Stable chronic right ACA infarct. CT head  02/09 > LE duplex 02/09 > EEG 02/09 >   CULTURES: Sputum 02/08 >  ANTIBIOTICS: Unasyn 02/08 >   SIGNIFICANT EVENTS: 02/08 > admit.  LINES/TUBES: ETT 02/08 >  DISCUSSION: 61 y.o. male from SNF due to old right ACA infarct, now admitted with AMS and required intubation in ED for airway protection.  Initial head CT negative for hemorrhage.  DDx acute infarct vs seizures vs opiate overdose (has been known to take a lot of opiates - only responded to 1 dose narcan with EMS then AMS worsened).  ASSESSMENT / PLAN:  NEUROLOGIC A:   Acute encephalopathy - unclear etiology at this point.  Initial head CT negative for hemorrhage.  DDx includes acute infarction, seizures, overdose (has been known to take a lot of opiates - responded once to narcan with EMS but then mental status worsened in ED).  UDS in the past has been positive for benzo's, opiates, THC. Hx CVA, seizures, schizoaffective disorder, MDD, anxiety, chronic pain. P:   Sedation:  Propofol gtt / Fentanyl PRN. RASS goal: 0 to -1. Daily WUA. Neuro following. Continue preadmission lamotrigine, levetiracetam. Assess UDS, EEG. Hold preadmission paroxetine, duloxetine, gabapentin, percocet, quetiapine, sertraline.   PULMONARY A: VDRF - due to inability to protect the airway. Concern for aspiration PNA - had projectile vomiting during intubation. Tobacco dependence. P:   Full vent support. Wean as able. VAP prevention measures. SBT in AM if neuro status allows. Abx / cultures per ID section. CXR in AM. Tobacco cessation counseling once extubated.  CARDIOVASCULAR A:  Hx HTN, HLD, CAD. P:  Monitor hemodynamics. Continue preadmission clonidine, ASA. Labetalol PRN. Hold preadmission amlodipine, atorvastatin, carvedilol, fenofibrate, lisinopril, nitro.Marland Kitchen  RENAL A:   No acute issues. P:   NS @ 75. BMP in AM.  GASTROINTESTINAL A:   GI prophylaxis. Nutrition. P:   Continue preadmission  PPI. NPO.  HEMATOLOGIC A:   R/o DVT - LLE has 2+ pitting edema (no edema to RLE). VTE Prophylaxis. P:  Assess LE duplex. SCD's / heparin. CBC in AM.  INFECTIOUS A:   Concern for aspiration PNA - had projectile vomiting during intubation. P:   Abx as above (unasyn).  Follow cultures as above.  ENDOCRINE A:   Hx DM.   P:   SSI. Hold preadmission metformin.   Family updated: Family updated over the phone by Dr. Otelia Limes.  Interdisciplinary Family Meeting v Palliative Care Meeting:  Due by: 06/10/16.  CC time: 35 minutes.   Rutherford Guys, [place] [facility] [place] [facility] Pager: [phone]  or [phone] 06/04/2016, 1:13 AM

## 2016-06-04 NOTE — Progress Notes (Signed)
Notified by RN that patient had self extubated.  Pt placed on N/C @ 2L, sats are currently 100%.  Will continue to monitor.

## 2016-06-04 NOTE — Consult Note (Addendum)
Referring Physician: Dr. Manus Gunning    Chief Complaint: Acute onset of AMS progressing to sonorous breathing with LOC  HPI: Terrance Cook is an 61 y.o. male who presents as a Code Stroke from his SNF. 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 He was LSN at the SNF while playing cards at 2045. He quit the card game and went back to his room, where he was noted to have acute onset of slurred speech. EMS was called and upon their arrival, he was also noted to be altered. No lateralized weakness other than left leg per EMS, which is chronic secondary to prior stroke. He was administered 1.4 of Narcan for possible opiate overdose and initially responded, but AMS declined again. A second dose of Narcan in the ED had no effect. He was fully oriented and able to converse with EMS en route, although with slurred speech and evidence for declining level of consciousness as well as agitation which worsened at 2245 and Code Stroke was called. Vitals with EMS: BP 200/100, FSBS 133, 97% on RA.   Upon arrival to the ED, he acutely worsened, with LOC and sonorous respirations, leaning on his stretcher towards the left. He was emergently intubated for airway protection. Projectile vomiting occurred intubation. 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 CTA was then obtained to assess for possible LVO.   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   Discussed his presentation over the phone with his daughter. She stated that he has a history of excessive opiate use in the past, typified by "nodding off" with sonorous breathing that she states matches the description of the current decompensation witnessed in the ED. After discussion of risks/benefits of tPA  with the patient's daughter in light of CTA that was negative for LVO and presentation that appeared more consistent with opiate overdose, she agreed with consensus assessment between myself and the ED attending that the risks of tPA administration outweighed potential benefits.   LSN: [id number] tPA Given: No: Due to opiate overdose being the most likely presentation NIHSS: 27 with exam performed prior to intubation   Past Medical History:  Diagnosis Date  . Anxiety 01/04/2015  . CAD (coronary artery disease) 01/04/2015  . Chronic pain 01/04/2015  . Coronary artery disease   . Diabetes mellitus without complication (HCC)   . H/O: CVA (cerebrovascular accident) 01/04/2015  . History of cardiac cath   . Hyperlipidemia 01/04/2015  . Hypertension   . MDD (major depressive disorder) 01/04/2015  . Schizoaffective disorder (HCC) 01/04/2015  . 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 Stroke (HCC)   . Tobacco use 01/04/2015    Past Surgical History:  Procedure Laterality Date  . CARDIAC SURGERY    . FEMUR FRACTURE SURGERY    . FEMUR IM NAIL Left 08/15/2014   Procedure: Operative Fixation left periprosthetic femur fracture;  Surgeon: Tarry Kos, MD;  Location: [facility] OR;  Service: Orthopedics;  Laterality: Left;  . HARDWARE REMOVAL Left 08/15/2014   Procedure: HARDWARE REMOVAL;  Surgeon: Tarry Kos, MD;  Location: [facility] OR;  Service: Orthopedics;  Laterality: Left;    Family History  Problem Relation Age of Onset  . Heart attack Mother    Social History:  reports that he has been smoking Cigarettes.  He has a 22.50 pack-year smoking history. He has never used smokeless tobacco. He reports that he does not drink alcohol or use drugs.  Allergies: No Known Allergies  Medications:  acetaminophen (TYLENOL) 325 MG tablet Take 650 mg by mouth every 6 (six) hours as needed for fever (pain). Historical Provider, MD Needs Review  amLODipine (NORVASC) 10 MG tablet Take 10 mg by mouth daily. Historical Provider,  MD Needs Review  aspirin 81 MG chewable tablet Chew 81 mg by mouth daily. Historical Provider, MD Needs Review  atorvastatin (LIPITOR) 20 MG tablet Take 20 mg by mouth daily at 6 PM.  Historical Provider, MD Needs Review  bisacodyl (DULCOLAX) 5 MG EC tablet Take 1 tablet (5 mg total) by mouth daily as needed for moderate constipation. Rodolph Bonganiel Thompson V, MD Needs Review  carvedilol (COREG) 3.125 MG tablet Take 1 tablet (3.125 mg total) by mouth 2 (two) times daily with a meal. Vassie Lollarlos Madera, MD Needs Review  cloNIDine (CATAPRES) 0.1 MG tablet Take 0.1 mg by mouth 2 (two) times daily. Historical Provider, MD Needs Review  diclofenac sodium (VOLTAREN) 1 % GEL Apply 2 g topically 2 (two) times daily. Apply to lower back, left hip, and left knee Historical Provider, MD Needs Review  DULoxetine (CYMBALTA) 60 MG capsule Take 60 mg by mouth daily. Historical Provider, MD Needs Review  febuxostat (ULORIC) 40 MG tablet Take 40 mg by mouth daily. Historical Provider, MD Needs Review  fenofibrate 160 MG tablet Take 160 mg by mouth daily. Historical Provider, MD Needs Review  gabapentin (NEURONTIN) 800 MG tablet Take 800 mg by mouth 2 (two) times daily.  Historical Provider, MD Needs Review  lamoTRIgine (LAMICTAL) 25 MG tablet Take 25 mg by mouth every morning.  Historical Provider, MD Needs Review  levETIRAcetam (KEPPRA) 500 MG tablet Take 1,000 mg by mouth 2 (two) times daily. Historical Provider, MD Needs Review  lisinopril (PRINIVIL,ZESTRIL) 10 MG tablet Take 10 mg by mouth daily. Historical Provider, MD Needs Review  metFORMIN (GLUCOPHAGE) 1000 MG tablet Take 1,000 mg by mouth 2 (two) times daily with a meal. Historical Provider, MD Needs Review  nicotine (NICODERM CQ - DOSED IN MG/24 HOURS) 21 mg/24hr patch Place 1 patch (21 mg total) onto the skin daily. Vassie Lollarlos Madera, MD Needs Review  nitroGLYCERIN (NITROSTAT) 0.4 MG SL tablet Place 0.4 mg under the tongue every 5 (five) minutes as needed for chest pain.  Historical Provider, MD Needs Review  nystatin cream (MYCOSTATIN) Apply 1 application topically 2 (two) times daily as needed (groin redness/itching). Historical Provider, MD Needs Review  oxybutynin (DITROPAN-XL) 5 MG 24 hr tablet Take 5 mg by mouth at bedtime. Historical Provider, MD Needs Review  Oxycodone HCl 10 MG TABS Take 10 mg by mouth 3 (three) times daily. Historical Provider, MD Needs Review  oxyCODONE-acetaminophen (PERCOCET) 5-325 MG tablet Take 1 tablet by mouth every 6 (six) hours as needed for severe pain. Mercedes Street, PA-C Needs Review  pantoprazole (PROTONIX) 40 MG tablet Take 1 tablet (40 mg total) by mouth daily at 6 (six) AM. Vassie Lollarlos Madera, MD Needs Review  QUEtiapine (SEROQUEL) 25 MG tablet Take 25 mg by mouth at bedtime. Historical Provider, MD Needs Review  sertraline (ZOLOFT) 50 MG tablet Take 50 mg by mouth daily. Historical Provider, MD Needs Review    ROS: Unable to obtain due to AMS.   Physical Examination: Blood pressure (!) 145/114, pulse 93, resp. rate 20, SpO2 100 %.  HEENT:  [place]/AT Lungs: Sonorous respirations prior to intubation. Ext: Chronic pitting edema LLE. No edema RLE.   Neurologic Examination: Exam performed while sedated and intubated.  Ment: No eye opening to any stimulus. Sedated and unresponsive to sternal rub except for mild agitation with movement of upper extremities and RLE. Does not respond to commands. (sedated and intubated) CN: Pupils unreactive. No blink to threat. No doll's eye reflex (sedated).  Face flaccidly symmetric. Minimal motor response to brow ridge pressure. No response to auditory stimuli. Motor/Sensory: Slight withdrawal to noxious stimuli equally in upper extremities. Slight withdrawal to noxious stimuli in RLE with minimal withdrawal LLE.  Reflexes: Hypoactive x 4 (sedated) Cerebellar/Gait: Unable to test.   Prior to sedation and intubation NIHSS was obtained, while patient was in acutely obtunded state. In this  context NIHSS = 27  Results for orders placed or performed during the hospital encounter of 06/03/16 (from the past 48 hour(s))  Protime-INR     Status: None   Collection Time: 06/03/16 11:33 PM  Result Value Ref Range   Prothrombin Time 14.1 11.4 - 15.2 seconds   INR 1.09   APTT     Status: None   Collection Time: 06/03/16 11:33 PM  Result Value Ref Range   aPTT 28 24 - 36 seconds  I-stat troponin, ED     Status: None   Collection Time: 06/03/16 11:35 PM  Result Value Ref Range   Troponin i, poc 0.00 0.00 - 0.08 ng/mL   Comment 3            Comment: Due to the release kinetics of cTnI, a negative result within the first hours of the onset of symptoms does not rule out myocardial infarction with certainty. If myocardial infarction is still suspected, repeat the test at appropriate intervals.   CBG monitoring, ED     Status: Abnormal   Collection Time: 06/03/16 11:36 PM  Result Value Ref Range   Glucose-Capillary 112 (H) 65 - 99 mg/dL  I-Stat Chem 8, ED     Status: Abnormal   Collection Time: 06/03/16 11:37 PM  Result Value Ref Range   Sodium 140 135 - 145 mmol/L   Potassium 3.9 3.5 - 5.1 mmol/L   Chloride 107 101 - 111 mmol/L   BUN 12 6 - 20 mg/dL   Creatinine, Ser 9.56 0.61 - 1.24 mg/dL   Glucose, Bld 93 65 - 99 mg/dL   Calcium, Ion 2.13 (L) 1.15 - 1.40 mmol/L   TCO2 24 0 - 100 mmol/L   Hemoglobin 13.3 13.0 - 17.0 g/dL   HCT 08.6 57.8 - 46.9 %  CBC     Status: Abnormal   Collection Time: 06/03/16 11:40 PM  Result Value Ref Range   WBC 5.0 4.0 - 10.5 K/uL   RBC 4.69 4.22 - 5.81 MIL/uL   Hemoglobin 12.4 (L) 13.0 - 17.0 g/dL   HCT 62.9 (L) 52.8 - 41.3 %   MCV 82.5 78.0 - 100.0 fL   MCH 26.4 26.0 - 34.0 pg   MCHC 32.0 30.0 - 36.0 g/dL   RDW 24.4 01.0 - 27.2 %   Platelets 146 (L) 150 - 400 K/uL  Differential     Status: None   Collection Time: 06/03/16 11:40 PM  Result Value Ref Range   Neutrophils Relative % 38 %   Neutro Abs 1.9 1.7 - 7.7 K/uL   Lymphocytes  Relative 53 %   Lymphs Abs 2.6 0.7 - 4.0 K/uL   Monocytes Relative 7 %  Monocytes Absolute 0.4 0.1 - 1.0 K/uL   Eosinophils Relative 2 %   Eosinophils Absolute 0.1 0.0 - 0.7 K/uL   Basophils Relative 0 %   Basophils Absolute 0.0 0.0 - 0.1 K/uL   No results found.  Assessment: 61 y.o. male with acute onset of worsening mentation and dysarthria progressing to obtundation, sonorous breathing and inability to protect airway.  1. Overall clinical picture most consistent with opiate overdose. Significantly lower on DDx is atypical presentation of seizure and brainstem stroke. The latter is felt to be unlikely given no basilar or vertebral artery occlusion or hemodynamically significant stenosis on CTA.  2. 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  3. Seizure disorder secondary to prior CVA.  4. History of opiate dependence with episodes of recreational use producing sedation, per family.  5. Stroke Risk Factors - HLD, HTN, CAD, prior stroke, DM  Plan: 1. Discussed his presentation over the phone with his daughter. She stated that he has a history of excessive opiate use in the past, typified by "nodding off" with sonorous breathing that she states matches the description of the current decompensation witnessed in the ED. After discussion of risks/benefits of tPA with the patient's daughter in light of CTA that was negative for LVO and presentation that appeared more consistent with opiate overdose, she agreed with consensus assessment between myself and the ED attending that the risks of tPA administration outweighed potential benefits. 2. Supportive care in MICU setting.  3. MRI of the brain without contrast 4. Repeat tox screen. Question if opiates were absent on initial urine tox screen if not sufficient time elapsed after suspected administration of overdose. Toxicology consult may be able to answer this question. May also need to evaluate for fentanyl or carfentanyl  as well. 5. Echocardiogram 5. PT consult, OT consult and Speech consult after extubation 6. EEG 7. Telemetry monitoring 8. Frequent neuro checks 9. Continue ASA and atorvastatin for secondary stroke prevention.  10. Continue Keppra and Lamictal for seizure prophylaxis. Will require NGT for administration of Lamictal.  11. The patient's daughter, Jonael Paradiso, would like to be contacted with updates at [phone].  60 minutes spent in the emergent Neurological evaluation and management of this critically ill patient.   @Electronically  signed: Dr. Caryl Pina  06/04/2016, 12:06 AM

## 2016-06-04 NOTE — ED Notes (Signed)
Daughter called, wants status update on her father. She lives several hours away and unable to come here. [phone]

## 2016-06-04 NOTE — Procedures (Signed)
ELECTROENCEPHALOGRAM REPORT  Date of Study: 06/04/2016  Patient's Name: Terrance Cook MRN: [id number] Date of Birth: 14-Apr-1956  Referring Provider: Rutherford Guysahul Desai, PA-C  Clinical History: This is a 61 year old man with altered mental status.   Medications: lamoTRIgine (LAMICTAL) tablet 25 mg  levETIRAcetam (KEPPRA) 100 MG/ML solution 1,000 mg  propofol (DIPRIVAN) 1000 MG/100ML infusion  Ampicillin-Sulbactam (UNASYN) 3 g in sodium chloride 0.9 % 100 mL IVPB  aspirin chewable tablet 81 mg  cloNIDine (CATAPRES) tablet 0.1 mg  dexamethasone (DECADRON) injection 6 mg  etomidate (AMIDATE) injection  heparin injection 5,000 Units  hydrALAZINE (APRESOLINE) injection 10-40 mg  insulin aspart (novoLOG) injection 0-20 Units  labetalol (NORMODYNE,TRANDATE) injection 20 mg  pantoprazole (PROTONIX) injection 40 mg   Technical Summary: A multichannel digital EEG recording measured by the international 10-20 system with electrodes applied with paste and impedances below 5000 ohms performed as portable with EKG monitoring in an intubated and unresponsive patient.  Hyperventilation and photic stimulation were not performed.  The digital EEG was referentially recorded, reformatted, and digitally filtered in a variety of bipolar and referential montages for optimal display.   Description: The patient is intubated and unresponsive on Fentanyl and Propofol during the recording. Propofol is turned off 2 minutes into the recording. There is no clear posterior dominant rhythm. The background consists of a large amount of diffuse beta, theta, and delta activity. There is occasional additional focal theta slowing seen over the right parietal region. During drowsiness and sleep, there is an increase in theta and delta slowing of the background with poorly formed vertex waves seen. There were occasional broad sharp waves seen over the right parietal region (P4), at times occurring in runs lasting 1-4 seconds without  evolution in frequency or amplitude. There were no electrographic seizures seen.    EKG lead was unremarkable.  Impression: This sedated EEG is abnormal due to the presence of: 1. Mild to moderate diffuse slowing of the background 2. Additional focal slowing over the right parietal region 3. Occasional epileptiform discharges over the right parietal region  Clinical Correlation of the above findings indicates diffuse cerebral dysfunction that is non-specific in etiology and can be seen with hypoxic/ischemic injury, toxic/metabolic encephalopathies, or medication effect. Focal slowing over the right parietal region indicates focal cerebral dysfunction in this region suggestive of underlying structural or physiologic abnormality with possible epileptogenic potential. There were no electrographic seizures in this study. Clinical correlation is advised.   Terrance Cook, M.D.

## 2016-06-04 NOTE — Procedures (Signed)
Intubation Procedure Note Terrance Cook [id number] 02/17/1956  Procedure: Intubation Indications: Respiratory insufficiency  Procedure Details Consent: Unable to obtain consent because of emergent medical necessity. Time Out: Verified patient identification, verified procedure, site/side was marked, verified correct patient position, special equipment/implants available, medications/allergies/relevent history reviewed, required imaging and test results available.  Performed  Maximum sterile technique was used including cap, gloves, gown, hand hygiene and mask.  MAC and 4    Evaluation Hemodynamic Status: BP stable throughout; O2 sats: stable throughout Patient's Current Condition: stable Complications: No apparent complications Patient did tolerate procedure well. Chest X-ray ordered to verify placement.  CXR: pending.   Terrance Cook. 06/04/2016  Airway edema, vocal cord edema moderate Pus in airway  Terrance Cook. Titus Mould, MD, [place] [facility]: [facility]

## 2016-06-04 NOTE — Care Management Note (Addendum)
Case Management Note  Patient Details  Name: Terrance Cook MRN: [id number] Date of Birth: 02/27/1956  Subjective/Objective:   Admitted with AMS                   Action/Plan:   [facility] from [facility].  CSW consulted for discharge planning.  CM will continue to follow for discharge needs   Expected Discharge Date:                  Expected Discharge Plan:     In-House Referral:     Discharge planning Services  CM Consult  Post Acute Care Choice:    Choice offered to:     DME Arranged:    DME Agency:     HH Arranged:    HH Agency:     Status of Service:  In process, will continue to follow  If discussed at Long Length of Stay Meetings, dates discussed:    Additional Comments:  Cherylann ParrClaxton, Blake Goya S, RN 06/04/2016, 10:16 AM

## 2016-06-04 NOTE — ED Notes (Signed)
Critical care at bedside  

## 2016-06-04 NOTE — ED Notes (Signed)
Pt transported to CT ?

## 2016-06-05 ENCOUNTER — Inpatient Hospital Stay (HOSPITAL_COMMUNITY): Payer: Medicare Other

## 2016-06-05 DIAGNOSIS — R600 Localized edema: Secondary | ICD-10-CM

## 2016-06-05 LAB — MAGNESIUM
Magnesium: 1.9 mg/dL (ref 1.7–2.4)
Magnesium: 1.9 mg/dL (ref 1.7–2.4)

## 2016-06-05 LAB — GLUCOSE, CAPILLARY
GLUCOSE-CAPILLARY: 124 mg/dL — AB (ref 65–99)
GLUCOSE-CAPILLARY: 105 mg/dL — AB (ref 65–99)
GLUCOSE-CAPILLARY: 119 mg/dL — AB (ref 65–99)
GLUCOSE-CAPILLARY: 122 mg/dL — AB (ref 65–99)
Glucose-Capillary: 130 mg/dL — ABNORMAL HIGH (ref 65–99)
Glucose-Capillary: 101 mg/dL — ABNORMAL HIGH (ref 65–99)

## 2016-06-05 LAB — CBC WITH DIFFERENTIAL/PLATELET
Basophils Absolute: 0 10*3/uL (ref 0.0–0.1)
Basophils Relative: 0 %
Eosinophils Absolute: 0 10*3/uL (ref 0.0–0.7)
Eosinophils Relative: 0 %
HEMATOCRIT: 34.1 % — AB (ref 39.0–52.0)
HEMOGLOBIN: 10.3 g/dL — AB (ref 13.0–17.0)
LYMPHS ABS: 0.4 10*3/uL — AB (ref 0.7–4.0)
Lymphocytes Relative: 14 %
MCH: 25.2 pg — ABNORMAL LOW (ref 26.0–34.0)
MCHC: 30.2 g/dL (ref 30.0–36.0)
MCV: 83.4 fL (ref 78.0–100.0)
MONOS PCT: 1 %
Monocytes Absolute: 0 10*3/uL — ABNORMAL LOW (ref 0.1–1.0)
NEUTROS ABS: 2.2 10*3/uL (ref 1.7–7.7)
NEUTROS PCT: 85 %
Platelets: 177 10*3/uL (ref 150–400)
RBC: 4.09 MIL/uL — AB (ref 4.22–5.81)
RDW: 15.4 % (ref 11.5–15.5)
WBC: 2.6 10*3/uL — ABNORMAL LOW (ref 4.0–10.5)

## 2016-06-05 LAB — BASIC METABOLIC PANEL
ANION GAP: 8 (ref 5–15)
BUN: 10 mg/dL (ref 6–20)
CALCIUM: 9.1 mg/dL (ref 8.9–10.3)
CHLORIDE: 115 mmol/L — AB (ref 101–111)
CO2: 23 mmol/L (ref 22–32)
Creatinine, Ser: 0.61 mg/dL (ref 0.61–1.24)
GFR calc non Af Amer: >60 mL/min (ref >60)
Glucose, Bld: 142 mg/dL — ABNORMAL HIGH (ref 65–99)
Potassium: 3.9 mmol/L (ref 3.5–5.1)
Sodium: 146 mmol/L — ABNORMAL HIGH (ref 135–145)

## 2016-06-05 LAB — HIV ANTIBODY (ROUTINE TESTING W REFLEX): HIV SCREEN 4TH GENERATION: NONREACTIVE

## 2016-06-05 LAB — PHOSPHORUS
Phosphorus: 4.2 mg/dL (ref 2.5–4.6)
Phosphorus: 4.8 mg/dL — ABNORMAL HIGH (ref 2.5–4.6)

## 2016-06-05 LAB — PROCALCITONIN

## 2016-06-05 MED ORDER — VITAL HIGH PROTEIN PO LIQD: 1000.0000 mL | ORAL | Status: DC

## 2016-06-05 MED ORDER — CHLORHEXIDINE GLUCONATE 0.12% ORAL RINSE (MEDLINE KIT)
15.0000 mL | Freq: Two times a day (BID) | OROMUCOSAL | Status: DC
  Administered 2016-06-05 – 2016-06-08 (×6): 15 mL via OROMUCOSAL

## 2016-06-05 MED ORDER — MIDAZOLAM HCL 2 MG/2ML IJ SOLN
2.0000 mg | Freq: Once | INTRAMUSCULAR | Status: AC
  Administered 2016-06-05: 2 mg via INTRAVENOUS

## 2016-06-05 MED ORDER — PANTOPRAZOLE SODIUM 40 MG PO PACK
40.0000 mg | PACK | ORAL | Status: DC
  Administered 2016-06-05 – 2016-06-06 (×2): 40 mg
  Filled 2016-06-05 (×4): qty 20

## 2016-06-05 MED ORDER — ATROPINE SULFATE 1 MG/10ML IJ SOSY
PREFILLED_SYRINGE | INTRAMUSCULAR | Status: AC
  Filled 2016-06-05: qty 10

## 2016-06-05 MED ORDER — ORAL CARE MOUTH RINSE
15.0000 mL | Freq: Four times a day (QID) | OROMUCOSAL | Status: DC
  Administered 2016-06-05 – 2016-06-08 (×8): 15 mL via OROMUCOSAL

## 2016-06-05 MED ORDER — PRO-STAT SUGAR FREE PO LIQD
30.0000 mL | Freq: Two times a day (BID) | ORAL | Status: DC
  Administered 2016-06-05: 30 mL
  Filled 2016-06-05 (×2): qty 30

## 2016-06-05 MED ORDER — VECURONIUM BROMIDE 10 MG IV SOLR
10.0000 mg | INTRAVENOUS | Status: DC | PRN
  Filled 2016-06-05: qty 10

## 2016-06-05 MED ORDER — VITAL HIGH PROTEIN PO LIQD
1000.0000 mL | ORAL | Status: DC
  Administered 2016-06-05 – 2016-06-06 (×2): 1000 mL
  Administered 2016-06-07 (×2)

## 2016-06-05 MED ORDER — PRO-STAT SUGAR FREE PO LIQD
60.0000 mL | Freq: Four times a day (QID) | ORAL | Status: DC
  Administered 2016-06-05 – 2016-06-06 (×7): 60 mL
  Filled 2016-06-05 (×10): qty 60

## 2016-06-05 MED ORDER — ETOMIDATE 2 MG/ML IV SOLN
INTRAVENOUS | Status: AC
  Filled 2016-06-05: qty 10

## 2016-06-05 MED ORDER — MIDAZOLAM HCL 2 MG/2ML IJ SOLN
INTRAMUSCULAR | Status: AC
  Filled 2016-06-05: qty 2

## 2016-06-05 MED ORDER — SODIUM CHLORIDE 0.45 % IV SOLN
INTRAVENOUS | Status: DC
  Administered 2016-06-05: 13:00:00 via INTRAVENOUS

## 2016-06-05 NOTE — Progress Notes (Signed)
eLink Physician-Brief Progress Note Patient Name: Jacinto HalimJames Duecker DOB: 31-May-1955 MRN: [id number]   Date of Service  06/05/2016  HPI/Events of Note  Patient with intermittent agitation, pulling lines.   eICU Interventions  Will renew restraints.      Intervention Category Intermediate Interventions: Other:  Louann SjogrenJose Angelo A De Dios 06/05/2016, 6:35 PM

## 2016-06-05 NOTE — Progress Notes (Signed)
**  Preliminary report by tech**  Bilateral lower extremity venous duplex completed. There is no evidence of deep or superficial vein thrombosis involving the right and left lower extremities. All visualized vessels appear patent and compressible. There is no evidence of Baker's cysts bilaterally. Results given to the patient's nurse, Mardella LaymanLindsey.  06/05/16 1:31 PM Olen CordialGreg Kineta Fudala RVT

## 2016-06-05 NOTE — Progress Notes (Signed)
Subjective: Self-extubated, then reintubated 2/8. Just came back from MRI, very sedated. Will open eyes to voice but will not follow any commands.   Objective: Current vital signs: BP 121/76   Pulse (!) 46   Temp 98.4 F (36.9 C) (Oral)   Resp 16   Ht 5\' 8"  (1.727 m)   Wt 203 lb 7.8 oz (92.3 kg)   SpO2 100%   BMI 30.94 kg/m  Vital signs in last 24 hours: Temp:  [97.5 F (36.4 C)-99 F (37.2 C)] 98.4 F (36.9 C) (02/09 1116) Pulse Rate:  [42-63] 46 (02/09 1114) Resp:  [14-16] 16 (02/09 1114) BP: (104-145)/(59-93) 121/76 (02/09 1114) SpO2:  [98 %-100 %] 100 % (02/09 1114) FiO2 (%):  [40 %] 40 % (02/09 1114)  Intake/Output from previous day: 02/08 0701 - 02/09 0700 In: 2625.9 [I.V.:2225.9; IV Piggyback:400] Out: 2550 [Urine:2550] Intake/Output this shift: Total I/O In: 496 [I.V.:396; IV Piggyback:100] Out: 150 [Urine:150] Nutritional status: Diet NPO time specified  ROS:                                                                                                                                       Unable to obtain as patient intubated and sedated     Neurologic Exam: General: Opens eyes to voice, not following commands Mental Status: Sedated on vent.  Cranial Nerves: II:  Unable to assess visual fields. PERRL.  III,IV, VI: ptosis not present, unable to assess EOM V,VII: Unable to assess  VIII: Unable to assess IX,X: Unable to assess XI: Unable to assess  XII: Unable to assess   Motor: Unable to assess strength Tone and bulk:normal tone throughout; no atrophy noted, 1+ swelling of LLE, no edema on right  Sensory: Unable to assess due to sedation  Deep Tendon Reflexes:  Plantars: Right: downgoing   Left: downgoing Cerebellar: Unable to assess  Gait: Patient sedated on vent, unable to assess   Lab Results: Basic Metabolic Panel:  Recent Labs Lab 06/03/16 2337 06/03/16 2347 06/04/16 0301 06/05/16 0216 06/05/16 0907  NA 140 139 146* 146*  --   K  3.9 4.1 3.8 3.9  --   CL 107 104 114* 115*  --   CO2  --  24 23 23   --   GLUCOSE 93 102* 103* 142*  --   BUN 12 9 8 10   --   CREATININE 1.00 1.06 0.81 0.61  --   CALCIUM  --  9.4 8.5* 9.1  --   MG  --   --  1.7  --  1.9  PHOS  --   --  3.8  --  4.2    Liver Function Tests:  Recent Labs Lab 06/03/16 2347  AST 30  ALT 17  ALKPHOS 37*  BILITOT 0.4  PROT 6.9  ALBUMIN 4.3    CBC:  Recent Labs Lab 06/03/16 2337 06/03/16 2340 06/04/16 0301 06/05/16 0216  WBC  --  5.0 4.9 2.6*  NEUTROABS  --  1.9  --  2.2  HGB 13.3 12.4* 11.4* 10.3*  HCT 39.0 38.7* 37.6* 34.1*  MCV  --  82.5 83.0 83.4  PLT  --  146* 155 177    Lipid Panel:  Recent Labs Lab 06/04/16 0112  TRIG 57    CBG:  Recent Labs Lab 06/04/16 1954 06/04/16 2328 06/05/16 0335 06/05/16 0733 06/05/16 1118  GLUCAP 117* 125* 130* 124* 105*    Microbiology: Results for orders placed or performed during the hospital encounter of 06/03/16  MRSA PCR Screening     Status: None   Collection Time: 06/04/16  2:45 AM  Result Value Ref Range Status   MRSA by PCR NEGATIVE NEGATIVE Final    Comment:        The GeneXpert MRSA Assay (FDA approved for NASAL specimens only), is one component of a comprehensive MRSA colonization surveillance program. It is not intended to diagnose MRSA infection nor to guide or monitor treatment for MRSA infections.     Coagulation Studies:  Recent Labs  06/03/16 2333  LABPROT 14.1  INR 1.09    Imaging: Ct Angio Head W Or Wo Contrast  Result Date: 06/04/2016 CLINICAL DATA:  Slurred speech. EXAM: CT ANGIOGRAPHY HEAD AND NECK TECHNIQUE: Multidetector CT imaging of the head and neck was performed using the standard protocol during bolus administration of intravenous contrast. Multiplanar CT image reconstructions and MIPs were obtained to evaluate the vascular anatomy. Carotid stenosis measurements (when applicable) are obtained utilizing NASCET criteria, using the distal  internal carotid diameter as the denominator. CONTRAST:  50 cc Isovue 370 COMPARISON:  06/04/2016 CT head. 08/08/2014 CT angiogram head and neck. FINDINGS: CTA NECK FINDINGS Aortic arch: Calcific atherosclerosis. Bovine arch, normal variant. No aneurysm or dissection identified. Right carotid system: No evidence of dissection, stenosis (50% or greater) or occlusion. Calcified plaque of the carotid bifurcation with mild less than 50% stenosis. Left carotid system: No evidence of dissection, stenosis (50% or greater) or occlusion. Calcified plaque of the carotid bifurcation with mild less than 50% stenosis. Vertebral arteries: Left dominant. No evidence of dissection, stenosis (50% or greater) or occlusion. Skeleton: Straightening of cervical lordosis from C2 through C5 and exaggerated lordosis from C5 through C7. Mild discogenic and facet degenerative changes. No high-grade bony canal stenosis. Other neck: Endotracheal tube tip approximately 1.6 cm from the carina. Enteric tube tip extends below the field of view into the abdomen. Debris within the nasal and oropharynx is likely due to intubation. Upper chest: Smooth septal thickening probably represents interstitial pulmonary edema. Small bilateral pleural effusions. Review of the MIP images confirms the above findings CTA HEAD FINDINGS Anterior circulation: .Calcific atherosclerosis of cavernous and paraclinoid internal carotid arteries bilaterally without significant stenosis. Patent bilateral M1 and distal MCA circulation. Patent bilateral A1 and A2 segments with diminutive caliber of distal right ACA circulation likely related to chronic infarct. Posterior circulation: Left V4 multiple foci of calcified plaque with mild stenosis. Basilar irregularity with mild mid segment stenosis compatible with atherosclerosis. Multiple segments of mild-to-moderate stenosis of bilateral P1 and P2 segments. Venous sinuses: As permitted by contrast timing, patent. Anatomic  variants: Diminutive right posterior communicating artery. No left posterior communicating artery or anterior communicating artery identified, likely hypoplastic or absent. Review of the MIP images confirms the above findings IMPRESSION: 1. Patent carotid and vertebral arteries of the neck. 2. Stable calcified plaque of the carotid bifurcations with mild less than 50% stenosis.  3. Patent circle of Willis with no high-grade stenosis, proximal occlusion, aneurysm, or vascular malformation identified 4. Intracranial atherosclerosis with multiple areas of mild-to-moderate stenosis most pronounced in the bilateral posterior cerebral arteries. 5. Diminutive right distal ACA circulation likely related to prior infarction. These results were called by telephone at the time of interpretation on 06/04/2016 at 12:46 am to Dr. Manus Gunningancour who verbally acknowledged these results. Electronically Signed   By: Mitzi HansenLance  Furusawa-Stratton M.D.   On: 06/04/2016 00:58   Ct Angio Neck W Or Wo Contrast  Result Date: 06/04/2016 CLINICAL DATA:  Slurred speech. EXAM: CT ANGIOGRAPHY HEAD AND NECK TECHNIQUE: Multidetector CT imaging of the head and neck was performed using the standard protocol during bolus administration of intravenous contrast. Multiplanar CT image reconstructions and MIPs were obtained to evaluate the vascular anatomy. Carotid stenosis measurements (when applicable) are obtained utilizing NASCET criteria, using the distal internal carotid diameter as the denominator. CONTRAST:  50 cc Isovue 370 COMPARISON:  06/04/2016 CT head. 08/08/2014 CT angiogram head and neck. FINDINGS: CTA NECK FINDINGS Aortic arch: Calcific atherosclerosis. Bovine arch, normal variant. No aneurysm or dissection identified. Right carotid system: No evidence of dissection, stenosis (50% or greater) or occlusion. Calcified plaque of the carotid bifurcation with mild less than 50% stenosis. Left carotid system: No evidence of dissection, stenosis (50% or  greater) or occlusion. Calcified plaque of the carotid bifurcation with mild less than 50% stenosis. Vertebral arteries: Left dominant. No evidence of dissection, stenosis (50% or greater) or occlusion. Skeleton: Straightening of cervical lordosis from C2 through C5 and exaggerated lordosis from C5 through C7. Mild discogenic and facet degenerative changes. No high-grade bony canal stenosis. Other neck: Endotracheal tube tip approximately 1.6 cm from the carina. Enteric tube tip extends below the field of view into the abdomen. Debris within the nasal and oropharynx is likely due to intubation. Upper chest: Smooth septal thickening probably represents interstitial pulmonary edema. Small bilateral pleural effusions. Review of the MIP images confirms the above findings CTA HEAD FINDINGS Anterior circulation: .Calcific atherosclerosis of cavernous and paraclinoid internal carotid arteries bilaterally without significant stenosis. Patent bilateral M1 and distal MCA circulation. Patent bilateral A1 and A2 segments with diminutive caliber of distal right ACA circulation likely related to chronic infarct. Posterior circulation: Left V4 multiple foci of calcified plaque with mild stenosis. Basilar irregularity with mild mid segment stenosis compatible with atherosclerosis. Multiple segments of mild-to-moderate stenosis of bilateral P1 and P2 segments. Venous sinuses: As permitted by contrast timing, patent. Anatomic variants: Diminutive right posterior communicating artery. No left posterior communicating artery or anterior communicating artery identified, likely hypoplastic or absent. Review of the MIP images confirms the above findings IMPRESSION: 1. Patent carotid and vertebral arteries of the neck. 2. Stable calcified plaque of the carotid bifurcations with mild less than 50% stenosis. 3. Patent circle of Willis with no high-grade stenosis, proximal occlusion, aneurysm, or vascular malformation identified 4.  Intracranial atherosclerosis with multiple areas of mild-to-moderate stenosis most pronounced in the bilateral posterior cerebral arteries. 5. Diminutive right distal ACA circulation likely related to prior infarction. These results were called by telephone at the time of interpretation on 06/04/2016 at 12:46 am to Dr. Manus Gunningancour who verbally acknowledged these results. Electronically Signed   By: Mitzi HansenLance  Furusawa-Stratton M.D.   On: 06/04/2016 00:58   Dg Chest Port 1 View  Result Date: 06/05/2016 CLINICAL DATA:  61 year old male with lethargy and decreasing mental status. Intubated. Initial encounter. EXAM: PORTABLE CHEST 1 VIEW COMPARISON:  06/04/2016 and earlier. FINDINGS:  Portable AP semi upright view at 0507 hours. The patient is rotated to the right. Endotracheal tube tip in good position. Enteric tube courses to the abdomen, tip not included. Stable cardiac size and mediastinal contours. Mildly increased streaky opacity at both lung bases. No pneumothorax, pulmonary edema or pleural effusion. IMPRESSION: 1.  Stable lines and tubes. 2. Rotated to the right. Mildly increased streaky opacity at the lung bases which most resembles atelectasis. Electronically Signed   By: Odessa Fleming M.D.   On: 06/05/2016 07:54   Dg Chest Port 1 View  Result Date: 06/04/2016 CLINICAL DATA:  ETT placement today. EXAM: PORTABLE CHEST 1 VIEW COMPARISON:  Single-view of the chest 06/04/2016. FINDINGS: Endotracheal tube is in place with the tip just below the clavicular heads, 2.6 cm above the carina. NG tube courses into the stomach and below the inferior margin of film. Mild discoid atelectasis in the lower lung zones bilaterally is identified. No pneumothorax or pleural effusion. Heart size is normal. Aortic atherosclerosis noted IMPRESSION: ETT and NG tube projecting good position. Mild discoid atelectasis bilaterally. Electronically Signed   By: Drusilla Kanner M.D.   On: 06/04/2016 09:28   Dg Chest Port 1 View  Result Date:  06/04/2016 CLINICAL DATA:  Respiratory failure EXAM: PORTABLE CHEST 1 VIEW COMPARISON:  June 03, 2016 FINDINGS: Endotracheal tube has been removed. No pneumothorax. There is atelectatic change in the left base. There is mild interstitial edema, but there is no airspace consolidation. Heart is upper normal in size with pulmonary venous hypertension no adenopathy. There is an old healed fracture of the posterior left fifth rib. IMPRESSION: There is a degree of interstitial pulmonary edema with pulmonary venous hypertension. Suspect a degree of congestive heart failure. There is left base atelectasis. No airspace consolidation. No evident adenopathy. Electronically Signed   By: Bretta Bang III M.D.   On: 06/04/2016 08:31   Dg Chest Portable 1 View  Result Date: 06/04/2016 CLINICAL DATA:  Intubation.  Unresponsive. EXAM: PORTABLE CHEST 1 VIEW COMPARISON:  06/03/2015 FINDINGS: The endotracheal tube is been placed with tip measuring 3 cm above the carina. Shallow inspiration with atelectasis in the lung bases. Increased opacity in the left perihilar region may be due to vascular crowding because of shallow inspiration but early infiltration or edema is not excluded. No blunting of costophrenic angles. No pneumothorax. Tortuous aorta. IMPRESSION: Endotracheal tube appears in satisfactory position. Shallow inspiration with atelectasis in the lung bases. Increased left perihilar density may be due to shallow inspiration or may indicate early infiltrate/edema. Electronically Signed   By: Burman Nieves M.D.   On: 06/04/2016 00:18   Dg Abd Portable 1v  Result Date: 06/04/2016 CLINICAL DATA:  Check gastric catheter placement EXAM: PORTABLE ABDOMEN - 1 VIEW COMPARISON:  06/04/2016 FINDINGS: Gastric catheter is again noted within the stomach stable from the prior exam. Scattered large and small bowel gas is noted. No bony abnormality is seen. IMPRESSION: Gastric catheter within the stomach. Electronically Signed    By: Alcide Clever M.D.   On: 06/04/2016 09:29   Dg Abd Portable 1v  Result Date: 06/04/2016 CLINICAL DATA:  Check gastric catheter placement EXAM: PORTABLE ABDOMEN - 1 VIEW COMPARISON:  None. FINDINGS: Gastric catheter is noted within the stomach directed towards the pylorus. Scattered large and small bowel gas is noted. Postsurgical changes in the left hip are noted. No acute abnormality is seen. IMPRESSION: Gastric catheter as described. Electronically Signed   By: Alcide Clever M.D.   On: 06/04/2016  07:31   Ct Head Code Stroke W/o Cm  Result Date: 06/04/2016 CLINICAL DATA:  Code stroke. EXAM: CT HEAD WITHOUT CONTRAST TECHNIQUE: Contiguous axial images were obtained from the base of the skull through the vertex without intravenous contrast. COMPARISON:  08/08/2014 CT head FINDINGS: Brain: Stable chronic right ACA distribution encephalomalacia from prior infarction. Stable chronic microvascular ischemic changes of white matter. No large acute infarct, focal mass effect, hydrocephalus, or intracranial hemorrhage identified. Vascular: Mild calcific atherosclerosis of cavernous internal carotid arteries bilaterally. Skull: No displaced calvarial fracture. Sinuses/Orbits: Mucosal thickening within ethmoid sinus is probably due to intubation. Orbits are unremarkable. Other: None. ASPECTS [facility] Stroke Program Early CT Score) - Ganglionic level infarction (caudate, lentiform nuclei, internal capsule, insula, M1-M3 cortex): 7 - Supraganglionic infarction (M4-M6 cortex): 3 Total score (0-10 with 10 being normal): 10 IMPRESSION: 1. No acute intracranial abnormality identified. 2. ASPECTS is 10 3. 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 Electronically Signed   By: Mitzi Hansen M.D.   On: 06/04/2016 00:27    Medications: I have reviewed the patient's current medications.  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 His acute encephalopathy is most likely secondary to opiate overdose.  Acute Encephalopathy: Initial CT head negative for hemorrhage. EEG with some focal slowing and occasional epileptiform discharges in the right parietal lobe, but this is nonspecific and no seizure-like activity was noted on his presentation. MRI brain without contrast does not show evidence of an acute infarct. His initial UDS was negative, but there is some question if enough time had passed for opiates to be present in the urine. Repeat drug screen pending. Based on the history, it seems that opioid overdose in the setting of polypharmacy (Klonopin, Gabapentin, Oxycodone, Clonidine, Lamictal, Keppra, Seroquel, Paxil) is likely the cause for his presentation.  Hx Right ACA CVA with LLE Weakness: MRI without evidence for acute infarct. Continue home ASA.  Hx Seizures: No seizure-like activity noted upon presentation. Continue home Keppra and Lamictal.   Hx Chronic Pain with Opioid Dependence: If determined to be opiate overdose then not a candidate for continued therapy. Will need non-opioid pain management.    Attending note to follow.   Rich Number, MD, MPH Internal Medicine Resident, PGY-III Pager: [phone] 06/05/2016, 11:38 AM

## 2016-06-05 NOTE — Progress Notes (Signed)
Initial Nutrition Assessment  DOCUMENTATION CODES:   Obesity unspecified  INTERVENTION:    Vital High Protein at 10 ml/h (240 ml per day)  Pro-stat 60 ml QID  Provides 1040 kcal, 141 gm protein, 201 ml free water daily  Total intake with TF + Propofol will be 1338 kcal (103% of estimated needs)  NUTRITION DIAGNOSIS:   Inadequate oral intake related to inability to eat as evidenced by NPO status.  GOAL:   Patient will meet greater than or equal to 90% of their needs  MONITOR:   Vent status, TF tolerance, Labs, I & O's  REASON FOR ASSESSMENT:   Consult Enteral/tube feeding initiation and management  ASSESSMENT:   61 y.o. male from SNF due to old right ACA infarct, now admitted with AMS and required intubation in ED for airway protection.  Initial head CT negative for hemorrhage.  DDx acute infarct vs seizures vs opiate overdose.  Discussed patient in ICU rounds and with RN today. Received MD Consult for TF initiation and management. Nutrition-Focused physical exam completed. Findings are no fat depletion, no muscle depletion, and mild edema.  Patient is currently intubated on ventilator support MV: 7.8 L/min Temp (24hrs), Avg:98.4 F (36.9 C), Min:97.5 F (36.4 C), Max:99 F (37.2 C)  Propofol: 11.3 ml/hr providing 298 kcal from lipid Labs and medications reviewed.  Diet Order:  Diet NPO time specified  Skin:  Reviewed, no issues  Last BM:  unknown  Height:   Ht Readings from Last 1 Encounters:  06/04/16 5\' 8"  (1.727 m)    Weight:   Wt Readings from Last 1 Encounters:  06/04/16 203 lb 7.8 oz (92.3 kg)    Ideal Body Weight:  70 kg  BMI:  Body mass index is 30.94 kg/m.  Estimated Nutritional Needs:   Kcal:  [id number]  Protein:  140 gm  Fluid:  2 L  EDUCATION NEEDS:   No education needs identified at this time  Joaquin CourtsKimberly Harris, RD, LDN, CNSC Pager [phone] After Hours Pager [phone]

## 2016-06-05 NOTE — Progress Notes (Signed)
Pt transported to and from MRI without incident 

## 2016-06-05 NOTE — Progress Notes (Addendum)
PULMONARY / CRITICAL CARE MEDICINE   Name: Terrance Cook MRN: [id number] DOB: 1955/12/11    ADMISSION DATE:  06/03/2016 CONSULTATION DATE:  06/04/16  REFERRING MD:  Otelia Limes  CHIEF COMPLAINT:  AMS  HISTORY OF PRESENT ILLNESS:  Pt is encephelopathic; therefore, this HPI is obtained from chart review. Terrance Cook is a 61 y.o. male with PMH as outlined below. 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  He was apparently playing cards and then went back to his room when done.  He was then found in his room with slurred speech.  EMS was called and upon their arrival, he was altered and had left leg weakness (though this is reportedly old from prior stroke). He was given 1.4mg  narcan and initially responded but then became altered again.  He received a second dose of narcan in the ED but had no response.  In ED, his mental status worsened and he had sonorous respirations with questionable airway protection.  He was subsequently intubated for airway protection.  He apparently had projectile vomiting during intubation. 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  Of note he also has a hx of seizure disorder and is on keppra and lamictal but no known recent seizures.  Family informed neuro that pt has been known to take a lot of opiates and in the past, he has had slurred speech from this.  PCCM was called for admission.   SUBJECTIVE:  Re-intubated on 06/04/2016 Occasional epileptiform discharges over the right parietal region Lateralizing     VITAL SIGNS: BP 126/79   Pulse (!) 47   Temp 97.5 F (36.4 C) (Oral)   Resp 16   Ht 5\' 8"  (1.727 m)   Wt 203 lb 7.8 oz (92.3 kg)   SpO2 100%   BMI 30.94 kg/m   HEMODYNAMICS:    VENTILATOR SETTINGS: Vent Mode: PRVC FiO2 (%):  [40 %] 40 % Set Rate:  [16 bmp] 16 bmp Vt Set:  [540 mL] 540  mL PEEP:  [5 cmH20] 5 cmH20 Pressure Support:  [10 cmH20] 10 cmH20 Plateau Pressure:  [14 cmH20-16 cmH20] 14 cmH20  INTAKE / OUTPUT: I/O last 3 completed shifts: In: 4537.1 [I.V.:4237.1; IV Piggyback:300] Out: 3350 [Urine:3350]   PHYSICAL EXAMINATION: General: Middle aged male, in distress now Neuro: Sedated, intubated opens his eyes to command, only moving right side to command, not moving left side  HEENT: [place]/AT. Pupils pinpoint, Cardiovascular: RRR, no M/R/G.  Lungs:  CTA bilaterally. Intubated  Abdomen: Obese, BS x 4, soft, NT/ND.  Musculoskeletal: No gross deformities, Left foot with 1+ pitting edema  Skin: Intact, warm, no rashes.  LABS:  BMET  Recent Labs Lab 06/03/16 2347 06/04/16 0301 06/05/16 0216  NA 139 146* 146*  K 4.1 3.8 3.9  CL 104 114* 115*  CO2 24 23 23   BUN 9 8 10   CREATININE 1.06 0.81 0.61  GLUCOSE 102* 103* 142*    Electrolytes  Recent Labs Lab 06/03/16 2347 06/04/16 0301 06/05/16 0216  CALCIUM 9.4 8.5* 9.1  MG  --  1.7  --   PHOS  --  3.8  --     CBC  Recent Labs Lab 06/03/16 2340 06/04/16 0301 06/05/16 0216  WBC 5.0 4.9 2.6*  HGB 12.4* 11.4* 10.3*  HCT 38.7* 37.6* 34.1*  PLT 146*  155 177    Coag's  Recent Labs Lab 06/03/16 2333  APTT 28  INR 1.09    Sepsis Markers  Recent Labs Lab 06/04/16 0101 06/04/16 0327 06/04/16 0615  LATICACIDVEN 1.20 1.6 1.8    ABG  Recent Labs Lab 06/04/16 0110 06/04/16 0439 06/04/16 1018  PHART 7.346* 7.350 7.334*  PCO2ART 46.9 42.8 47.1  PO2ART 142.0* 244* 134*    Liver Enzymes  Recent Labs Lab 06/03/16 2347  AST 30  ALT 17  ALKPHOS 37*  BILITOT 0.4  ALBUMIN 4.3    Cardiac Enzymes No results for input(s): TROPONINI, PROBNP in the last 168 hours.  Glucose  Recent Labs Lab 06/04/16 0734 06/04/16 1134 06/04/16 1516 06/04/16 1954 06/04/16 2328 06/05/16 0335  GLUCAP 98 108* 122* 117* 125* 130*    Imaging Dg Chest Port 1 View  Result Date:  06/04/2016 CLINICAL DATA:  ETT placement today. EXAM: PORTABLE CHEST 1 VIEW COMPARISON:  Single-view of the chest 06/04/2016. FINDINGS: Endotracheal tube is in place with the tip just below the clavicular heads, 2.6 cm above the carina. NG tube courses into the stomach and below the inferior margin of film. Mild discoid atelectasis in the lower lung zones bilaterally is identified. No pneumothorax or pleural effusion. Heart size is normal. Aortic atherosclerosis noted IMPRESSION: ETT and NG tube projecting good position. Mild discoid atelectasis bilaterally. Electronically Signed   By: Drusilla Kanner M.D.   On: 06/04/2016 09:28   Dg Chest Port 1 View  Result Date: 06/04/2016 CLINICAL DATA:  Respiratory failure EXAM: PORTABLE CHEST 1 VIEW COMPARISON:  June 03, 2016 FINDINGS: Endotracheal tube has been removed. No pneumothorax. There is atelectatic change in the left base. There is mild interstitial edema, but there is no airspace consolidation. Heart is upper normal in size with pulmonary venous hypertension no adenopathy. There is an old healed fracture of the posterior left fifth rib. IMPRESSION: There is a degree of interstitial pulmonary edema with pulmonary venous hypertension. Suspect a degree of congestive heart failure. There is left base atelectasis. No airspace consolidation. No evident adenopathy. Electronically Signed   By: Bretta Bang III M.D.   On: 06/04/2016 08:31   Dg Abd Portable 1v  Result Date: 06/04/2016 CLINICAL DATA:  Check gastric catheter placement EXAM: PORTABLE ABDOMEN - 1 VIEW COMPARISON:  06/04/2016 FINDINGS: Gastric catheter is again noted within the stomach stable from the prior exam. Scattered large and small bowel gas is noted. No bony abnormality is seen. IMPRESSION: Gastric catheter within the stomach. Electronically Signed   By: Alcide Clever M.D.   On: 06/04/2016 09:29     STUDIES:  CT head 02/08 > negative for acute process.  Stable chronic right ACA  infarct. CT  Angio 02/09 > Intracranial atherosclerosis with multiple areas of mild-to-moderate stenosis most pronounced in the bilateral posterior;Diminutive right distal ACA circulation likely related to prior Infarction. cerebral arteries. LE duplex 02/09 > EEG 02/09 >>> indicates diffuse cerebral dysfunction that is non-specific in etiology and can be seen with hypoxic/ischemic injury, toxic/metabolic encephalopathies, or medication effect. Focal slowing over the right parietal region indicates focal cerebral dysfunction in this region suggestive of underlying structural or physiologic abnormality with possible epileptogenic potential MRI 02/08>>> CXR 02/08>> atelectasis bilaterally   CULTURES: Sputum 02/08 > NGTD  ANTIBIOTICS: Unasyn 02/08 >   SIGNIFICANT EVENTS: 02/08 > admit.  LINES/TUBES: ETT 02/08 >  DISCUSSION: 61 y.o. male from SNF due to old right ACA infarct, now admitted with AMS and required intubation in ED  for airway protection.  Initial head CT negative for hemorrhage.  DDx acute infarct vs seizures vs opiate overdose (has been known to take a lot of opiates - only responded to 1 dose narcan with EMS then AMS worsened).  ASSESSMENT / PLAN:  NEUROLOGIC A:   Acute encephalopathy - unclear etiology at this point.  Initial head CT negative for hemorrhage.  DDx includes acute infarction, seizures, overdose (has been known to take a lot of opiates - responded once to narcan with EMS but then mental status worsened in ED). UDS in the past has been positive for benzo's, opiates, THC. Hx CVA, seizures, schizoaffective disorder, MDD, anxiety, chronic pain. UDS and Ethanol this admission negative  EEG non-specific  P:   Sedation:  Propofol gtt / Fentanyl PRN RASS goal: -2 Neuro following. MRI pending  Drug screen pending  Continue preadmission lamotrigine, levetiracetam. Hold preadmission paroxetine, duloxetine, gabapentin, percocet, quetiapine, sertraline.  eeg  done  PULMONARY A: VDRF - due to inability to protect the airway. Concern for aspiration PNA - had projectile vomiting during intubation. Tobacco dependence. Self extubated, some acute distress - airway swelling P:   Wean vent this AM  May need ett again Abx /cultures per ID section.  Steroids for vocal cord swelling x 4 doses Will need leak testing CXR in am   CARDIOVASCULAR A:  Hx HTN, HLD, CAD. Bradycardia- asymptomtic (prop) P:  Monitor hemodynamics. Continue preadmission clonidine, ASA. Labetalol PRN- dc If needed use hydralazine Hold preadmission amlodipine, atorvastatin, carvedilol, fenofibrate, lisinopril, nitro.Marland Kitchen.  RENAL A:   Hyperchloremia P:   NS @ 75, dc, avoid kvo 1/2 NS BMP in AM.  GASTROINTESTINAL A:   GI prophylaxis. Nutrition. P:   Continue preadmission PPI. Consider Tube feeds   HEMATOLOGIC A:   R/o DVT - LLE has 2+ pitting edema (no edema to RLE). VTE Prophylaxis. leukopenia P:  LE duplex pending  SCD's / heparin. CBC in AM. Send HIV  INFECTIOUS A:   Concern for aspiration PNA - had projectile vomiting during intubation. Leukopenic  CXR with bibasilar atelectasis  P:   Abx as above (unasyn).  Follow cultures as above.  ENDOCRINE A:   Hx DM.   P:   SSI. Hold preadmission metformin.  STAFF NOTE: I, Rory Percyaniel Phelicia Dantes, MD FACP have personally reviewed patient's available data, including medical history, events of note, physical examination and test results as part of my evaluation. I have discussed with resident/NP and other care providers such as pharmacist, RN and RRT. In addition, I personally evaluated patient and elicited key findings of: sedated rass-1, did follow commands, weakness left upper?, does wiggle toes, perrl, it remains unclear etiology of AMS- todds paralysis? R/o cva, for MRI brain, eeg done, may need neuro assessment, pcxr c/w atx, unasyn to remain for aspiration, assess pct today and in am if neg will dc abx at  3-5 days, plan is SBt today with WUA when able, follow low WBC with dif in am , assess hiv, no family available , give decadron will need leak testing when able to plan extubation The patient is critically ill with multiple organ systems failure and requires high complexity decision making for assessment and support, frequent evaluation and titration of therapies, application of advanced monitoring technologies and extensive interpretation of multiple databases.   Critical Care Time devoted to patient care services described in this note is 30 Minutes. This time reflects time of care of this signee: Rory Percyaniel Jazae Gandolfi, MD FACP. This critical care time does not  reflect procedure time, or teaching time or supervisory time of PA/NP/Med student/Med Resident etc but could involve care discussion time. Rest per NP/medical resident whose note is outlined above and that I agree with   Mcarthur Rossetti. Tyson Alias, MD, FACP Pgr: [phone] [facility] 06/05/2016 9:00 AM

## 2016-06-06 LAB — CBC WITH DIFFERENTIAL/PLATELET
BASOS ABS: 0 10*3/uL (ref 0.0–0.1)
Basophils Relative: 0 %
Eosinophils Absolute: 0 10*3/uL (ref 0.0–0.7)
Eosinophils Relative: 0 %
HEMATOCRIT: 36.7 % — AB (ref 39.0–52.0)
HEMOGLOBIN: 11.3 g/dL — AB (ref 13.0–17.0)
LYMPHS PCT: 16 %
Lymphs Abs: 0.9 10*3/uL (ref 0.7–4.0)
MCH: 26.1 pg (ref 26.0–34.0)
MCHC: 30.8 g/dL (ref 30.0–36.0)
MCV: 84.8 fL (ref 78.0–100.0)
Monocytes Absolute: 0.3 10*3/uL (ref 0.1–1.0)
Monocytes Relative: 6 %
NEUTROS ABS: 4.3 10*3/uL (ref 1.7–7.7)
NEUTROS PCT: 78 %
PLATELETS: 188 10*3/uL (ref 150–400)
RBC: 4.33 MIL/uL (ref 4.22–5.81)
RDW: 16 % — ABNORMAL HIGH (ref 11.5–15.5)
WBC: 5.5 10*3/uL (ref 4.0–10.5)

## 2016-06-06 LAB — GLUCOSE, CAPILLARY
GLUCOSE-CAPILLARY: 103 mg/dL — AB (ref 65–99)
GLUCOSE-CAPILLARY: 86 mg/dL (ref 65–99)
Glucose-Capillary: 92 mg/dL (ref 65–99)
Glucose-Capillary: 81 mg/dL (ref 65–99)
Glucose-Capillary: 85 mg/dL (ref 65–99)

## 2016-06-06 LAB — PHOSPHORUS
Phosphorus: 4.1 mg/dL (ref 2.5–4.6)
Phosphorus: 4 mg/dL (ref 2.5–4.6)

## 2016-06-06 LAB — BASIC METABOLIC PANEL
Anion gap: 7 (ref 5–15)
BUN: 25 mg/dL — AB (ref 6–20)
CHLORIDE: 116 mmol/L — AB (ref 101–111)
CO2: 26 mmol/L (ref 22–32)
Calcium: 9.1 mg/dL (ref 8.9–10.3)
Creatinine, Ser: 0.68 mg/dL (ref 0.61–1.24)
GFR calc non Af Amer: >60 mL/min (ref >60)
Glucose, Bld: 81 mg/dL (ref 65–99)
POTASSIUM: 4 mmol/L (ref 3.5–5.1)
SODIUM: 149 mmol/L — AB (ref 135–145)

## 2016-06-06 LAB — PROCALCITONIN

## 2016-06-06 LAB — MAGNESIUM
Magnesium: 2 mg/dL (ref 1.7–2.4)
Magnesium: 2 mg/dL (ref 1.7–2.4)

## 2016-06-06 MED ORDER — MIDAZOLAM HCL 2 MG/2ML IJ SOLN
1.0000 mg | INTRAMUSCULAR | Status: DC | PRN
  Administered 2016-06-06 – 2016-06-07 (×3): 2 mg via INTRAVENOUS
  Filled 2016-06-06: qty 4
  Filled 2016-06-06: qty 2

## 2016-06-06 NOTE — Progress Notes (Signed)
eLink Physician-Brief Progress Note Patient Name: Terrance HalimJames Cook DOB: 1955/12/29 MRN: [id number]   Date of Service  06/06/2016  HPI/Events of Note  Patient reportedly agitated and easily arousable. Note reviewed and patient self extubated previously with emergent reintubation due to vocal cord edema. Currently on propofol and fentanyl. Currently has bilateral wrist restraints but is also taking in bed and attempting to sit up.   eICU Interventions  1. Ordering bilateral soft ankle restraints as well 2. Ordering Versed IV when necessary for sedation and agitation      Intervention Category Major Interventions: Delirium, psychosis, severe agitation - evaluation and management  Terrance Cook 06/06/2016, 10:33 PM

## 2016-06-06 NOTE — Progress Notes (Signed)
PULMONARY / CRITICAL CARE MEDICINE   Name: Terrance Cook MRN: [id number] DOB: 28-Feb-1956    ADMISSION DATE:  06/03/2016 CONSULTATION DATE:  06/04/16  REFERRING MD:  Otelia Limes  CHIEF COMPLAINT:  AMS  HISTORY OF PRESENT ILLNESS:  Pt is encephelopathic; therefore, this HPI is obtained from chart review. Terrance Cook is a 61 y.o. male with PMH as outlined below. 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  He was apparently playing cards and then went back to his room when done.  He was then found in his room with slurred speech.  EMS was called and upon their arrival, he was altered and had left leg weakness (though this is reportedly old from prior stroke). He was given 1.4mg  narcan and initially responded but then became altered again.  He received a second dose of narcan in the ED but had no response.  In ED, his mental status worsened and he had sonorous respirations with questionable airway protection.  He was subsequently intubated for airway protection.  He apparently had projectile vomiting during intubation. 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  Of note he also has a hx of seizure disorder and is on keppra and lamictal but no known recent seizures.  Family informed neuro that pt has been known to take a lot of opiates and in the past, he has had slurred speech from this.  PCCM was called for admission.  SUBJECTIVE:  Remains Bradycardic  MRI negative for stroke and EEG shows right sided epileptiform discharges which is likely due to old right ACA stroke    VITAL SIGNS: BP (!) 160/58   Pulse (!) 37   Temp 97.7 F (36.5 C) (Oral)   Resp 16   Ht 5\' 8"  (1.727 m)   Wt 207 lb 0.2 oz (93.9 kg)   SpO2 100%   BMI 31.48 kg/m   HEMODYNAMICS:    VENTILATOR SETTINGS: Vent Mode: PRVC FiO2 (%):  [30 %-40 %] 30 % Set Rate:  [16  bmp] 16 bmp Vt Set:  [532 mL-540 mL] 532 mL PEEP:  [5 cmH20] 5 cmH20 Plateau Pressure:  [14 cmH20-15 cmH20] 14 cmH20  INTAKE / OUTPUT: I/O last 3 completed shifts: In: 3317.6 [I.V.:2564.4; NG/GT:153.2; IV Piggyback:600] Out: 995 [Urine:995]   PHYSICAL EXAMINATION: General: Middle aged male, NAD Neuro: Sedated, intubated opens his eyes to command, refusing to follow command HEENT: [place]/AT. Pupils pinpoint, Cardiovascular: RRR, no M/R/G.  Lungs:  CTA bilaterally. Intubated  Abdomen: Obese, BS x 4, soft, NT/ND.  Musculoskeletal: No gross deformities, No pitting edema noted  Skin: Intact, warm, no rashes.  LABS:  BMET  Recent Labs Lab 06/03/16 2347 06/04/16 0301 06/05/16 0216  NA 139 146* 146*  K 4.1 3.8 3.9  CL 104 114* 115*  CO2 24 23 23   BUN 9 8 10   CREATININE 1.06 0.81 0.61  GLUCOSE 102* 103* 142*    Electrolytes  Recent Labs Lab 06/03/16 2347  06/04/16 0301 06/05/16 0216 06/05/16 0907 06/05/16 1621 06/06/16 0306  CALCIUM 9.4  --  8.5* 9.1  --   --   --   MG  --   < > 1.7  --  1.9 1.9 2.0  PHOS  --   < > 3.8  --  4.2 4.8* 4.1  < > = values in  this interval not displayed.  CBC  Recent Labs Lab 06/04/16 0301 06/05/16 0216 06/06/16 0306  WBC 4.9 2.6* 5.5  HGB 11.4* 10.3* 11.3*  HCT 37.6* 34.1* 36.7*  PLT 155 177 188    Coag's  Recent Labs Lab 06/03/16 2333  APTT 28  INR 1.09    Sepsis Markers  Recent Labs Lab 06/04/16 0101 06/04/16 0327 06/04/16 0615 06/05/16 0907 06/06/16 0306  LATICACIDVEN 1.20 1.6 1.8  --   --   PROCALCITON  --   --   --  <0.10 <0.10    ABG  Recent Labs Lab 06/04/16 0110 06/04/16 0439 06/04/16 1018  PHART 7.346* 7.350 7.334*  PCO2ART 46.9 42.8 47.1  PO2ART 142.0* 244* 134*    Liver Enzymes  Recent Labs Lab 06/03/16 2347  AST 30  ALT 17  ALKPHOS 37*  BILITOT 0.4  ALBUMIN 4.3    Cardiac Enzymes No results for input(s): TROPONINI, PROBNP in the last 168 hours.  Glucose  Recent Labs Lab  06/05/16 0733 06/05/16 1118 06/05/16 1621 06/05/16 2011 06/05/16 2311 06/06/16 0341  GLUCAP 124* 105* 119* 101* 122* 92    Imaging Mr Brain Wo Contrast  Result Date: 06/05/2016 CLINICAL DATA:  Slurred speech. Prior stroke with residual left lower extremity weakness. EXAM: MRI HEAD WITHOUT CONTRAST TECHNIQUE: Multiplanar, multiecho pulse sequences of the brain and surrounding structures were obtained without intravenous contrast. COMPARISON:  Head CT/ CTA 12/22/2016 and MRI 08/09/2014 FINDINGS: Brain: There is no evidence of acute infarct, intracranial hemorrhage, mass, midline shift, or extra-axial fluid collection. A chronic right ACA infarct is again noted. Small foci of T2 hyperintensity elsewhere in the cerebral white matter bilaterally are also unchanged from 2016 and nonspecific but compatible with mild chronic small vessel ischemic disease. There is mild ex vacuo enlargement of the right lateral ventricle. Vascular: Major intracranial vascular flow voids are preserved. Skull and upper cervical spine: Unremarkable bone marrow signal. Sinuses/Orbits: Unremarkable orbits.  No significant sinus disease. Other: None. IMPRESSION: 1. No acute intracranial abnormality. 2. Chronic right ACA infarct and mild chronic small vessel ischemia. Electronically Signed   By: Sebastian Ache M.D.   On: 06/05/2016 13:14     STUDIES:  CT head 02/08 > negative for acute process.  Stable chronic right ACA infarct. CT  Angio 02/09 > Intracranial atherosclerosis with multiple areas of mild-to-moderate stenosis most pronounced in the bilateral posterior;Diminutive right distal ACA circulation likely related to prior Infarction. cerebral arteries. LE duplex 02/09 > Negative for clot  EEG 02/09 >>> indicates diffuse cerebral dysfunction that is non-specific in etiology and can be seen with hypoxic/ischemic injury, toxic/metabolic encephalopathies, or medication effect. Focal slowing over the right parietal region  indicates focal cerebral dysfunction likely due ACA stroke in the past  MRI 02/08>>>Negative for acute findings  CXR 02/08>> atelectasis bilaterally   CULTURES: Sputum 02/08 > NGTD  ANTIBIOTICS: Unasyn 02/08 >   SIGNIFICANT EVENTS: 02/08 > admit.  LINES/TUBES: ETT 02/08 >  DISCUSSION: 61 y.o. male from SNF due to old right ACA infarct, now admitted with AMS and required intubation in ED for airway protection.  Initial head CT negative for hemorrhage.  DDx acute infarct vs seizures vs opiate overdose (has been known to take a lot of opiates - only responded to 1 dose narcan with EMS then AMS worsened).  ASSESSMENT / PLAN:  NEUROLOGIC A:   Acute encephalopathy - unclear etiology at this point.  Initial head CT negative for hemorrhage.  DDx includes acute infarction, seizures, overdose (  has been known to take a lot of opiates - responded once to narcan with EMS but then mental status worsened in ED). UDS in the past has been positive for benzo's, opiates, THC. Hx CVA, seizures, schizoaffective disorder, MDD, anxiety, chronic pain. UDS and Ethanol this admission negative  EEG non-specific  P:   Sedation:  Propofol gtt / Fentanyl PRN RASS goal: -2 Neuro following. Drug screen pending  Continue preadmission lamotrigine, levetiracetam. Hold preadmission paroxetine, duloxetine, gabapentin, percocet, quetiapine, sertraline.    PULMONARY A: VDRF - due to inability to protect the airway. Concern for aspiration PNA - had projectile vomiting during intubation. Tobacco dependence. Self extubated, some acute distress - airway swelling Steroids given for vocal cord swelling x 4 doses P:   Wean vent,SBT  Consider extubating today  Abx /cultures per ID section.  Will need leak testing  CARDIOVASCULAR A:  Hx HTN, HLD, CAD. Bradycardia- asymptomtic (prop) P:  Monitor hemodynamics. Continue preadmission clonidine, ASA. If needed use hydralazine Hold preadmission amlodipine,  atorvastatin, carvedilol, fenofibrate, lisinopril, nitro.Marland Kitchen.  RENAL A:   Hyperchloremia P:   kvo 1/2 NS BMP in AM.  GASTROINTESTINAL A:   GI prophylaxis. Nutrition. P:   Continue preadmission PPI. Consider Tube feeds vs. Diet if extubating   HEMATOLOGIC A:   R/o DVT with Venous Doppler  VTE Prophylaxis. HIV negative  P:  SCD's / heparin. CBC in AM.   INFECTIOUS A:   Concern for aspiration PNA - had projectile vomiting during intubation. CXR with bibasilar atelectasis  P:   Abx as above (unasyn).  Follow cultures as above.  ENDOCRINE A:   Hx DM.   P:   SSI. Hold preadmission metformin.  Attending Note:  61 year old male with PMH above presenting to PCCM with opioid overdose and was intubated for airway protection.  Remains very agitated of sedation with clear lungs on exam.  I reviewed MRI myself, chronic ACA CVA noted.  Patient remains highly agitated.  Self extubated in the past.  Will keep intubated for today.  Hold weaning.  Continue Keppra.  Need neurology's guidance with lack of acute CVA and level of agitation (is it keppra related vs another issue).  May consider switch to precedex and begin weaning.  Hope is to avoid tracheostomy if patient continues to be this agitated.  The patient is critically ill with multiple organ systems failure and requires high complexity decision making for assessment and support, frequent evaluation and titration of therapies, application of advanced monitoring technologies and extensive interpretation of multiple databases.   Critical Care Time devoted to patient care services described in this note is  35  Minutes. This time reflects time of care of this signee Dr Koren BoundWesam Toinette Lackie. This critical care time does not reflect procedure time, or teaching time or supervisory time of PA/NP/Med student/Med Resident etc but could involve care discussion time.  Alyson ReedyWesam G. Shane Badeaux, M.D. [facility]. Pager:  [phone]. After hours pager: [phone].

## 2016-06-07 DIAGNOSIS — J9601 Acute respiratory failure with hypoxia: Secondary | ICD-10-CM

## 2016-06-07 LAB — BASIC METABOLIC PANEL
Anion gap: 11 (ref 5–15)
BUN: 13 mg/dL (ref 6–20)
CALCIUM: 8.9 mg/dL (ref 8.9–10.3)
CO2: 29 mmol/L (ref 22–32)
CREATININE: 0.56 mg/dL — AB (ref 0.61–1.24)
Chloride: 105 mmol/L (ref 101–111)
GFR calc non Af Amer: >60 mL/min (ref >60)
Glucose, Bld: 77 mg/dL (ref 65–99)
Potassium: 3.1 mmol/L — ABNORMAL LOW (ref 3.5–5.1)
SODIUM: 145 mmol/L (ref 135–145)

## 2016-06-07 LAB — GLUCOSE, CAPILLARY
GLUCOSE-CAPILLARY: 60 mg/dL — AB (ref 65–99)
GLUCOSE-CAPILLARY: 96 mg/dL (ref 65–99)
GLUCOSE-CAPILLARY: 119 mg/dL — AB (ref 65–99)
Glucose-Capillary: 96 mg/dL (ref 65–99)
Glucose-Capillary: 98 mg/dL (ref 65–99)
Glucose-Capillary: 98 mg/dL (ref 65–99)

## 2016-06-07 LAB — PROCALCITONIN

## 2016-06-07 LAB — TRIGLYCERIDES: Triglycerides: 155 mg/dL — ABNORMAL HIGH (ref <150)

## 2016-06-07 MED ORDER — POTASSIUM CHLORIDE CRYS ER 20 MEQ PO TBCR
20.0000 meq | EXTENDED_RELEASE_TABLET | ORAL | Status: AC
  Administered 2016-06-07 (×2): 20 meq via ORAL
  Filled 2016-06-07 (×2): qty 1

## 2016-06-07 MED ORDER — LEVETIRACETAM 500 MG PO TABS
1000.0000 mg | ORAL_TABLET | Freq: Two times a day (BID) | ORAL | Status: DC
  Administered 2016-06-07 – 2016-06-12 (×10): 1000 mg via ORAL
  Filled 2016-06-07 (×10): qty 2

## 2016-06-07 MED ORDER — KETOROLAC TROMETHAMINE 15 MG/ML IJ SOLN
15.0000 mg | Freq: Once | INTRAMUSCULAR | Status: AC
  Administered 2016-06-07: 15 mg via INTRAVENOUS
  Filled 2016-06-07: qty 1

## 2016-06-07 MED ORDER — DEXTROSE 50 % IV SOLN: 25.0000 mL | Freq: Once | INTRAVENOUS | Status: AC

## 2016-06-07 MED ORDER — LAMOTRIGINE 25 MG PO TABS
25.0000 mg | ORAL_TABLET | Freq: Every day | ORAL | Status: DC
  Administered 2016-06-08 – 2016-06-12 (×5): 25 mg via ORAL
  Filled 2016-06-07 (×5): qty 1

## 2016-06-07 MED ORDER — DEXTROSE-NACL 5-0.45 % IV SOLN
INTRAVENOUS | Status: DC
  Administered 2016-06-07: 10:00:00 via INTRAVENOUS

## 2016-06-07 MED ORDER — PANTOPRAZOLE SODIUM 40 MG PO TBEC
40.0000 mg | DELAYED_RELEASE_TABLET | Freq: Every day | ORAL | Status: DC
  Administered 2016-06-08 – 2016-06-12 (×5): 40 mg via ORAL
  Filled 2016-06-07 (×5): qty 1

## 2016-06-07 MED ORDER — CLONAZEPAM 0.5 MG PO TABS
0.2500 mg | ORAL_TABLET | Freq: Two times a day (BID) | ORAL | Status: DC
  Administered 2016-06-07 – 2016-06-12 (×10): 0.25 mg via ORAL
  Filled 2016-06-07 (×10): qty 1

## 2016-06-07 MED ORDER — DEXTROSE 50 % IV SOLN
INTRAVENOUS | Status: AC
  Administered 2016-06-07: 50 mL
  Filled 2016-06-07: qty 50

## 2016-06-07 MED ORDER — CLONIDINE HCL 0.1 MG PO TABS
0.1000 mg | ORAL_TABLET | Freq: Two times a day (BID) | ORAL | Status: DC
  Administered 2016-06-08 – 2016-06-12 (×9): 0.1 mg via ORAL
  Filled 2016-06-07 (×9): qty 1

## 2016-06-07 MED ORDER — ASPIRIN 81 MG PO CHEW
81.0000 mg | CHEWABLE_TABLET | Freq: Every day | ORAL | Status: DC
  Administered 2016-06-08 – 2016-06-12 (×5): 81 mg via ORAL
  Filled 2016-06-07 (×5): qty 1

## 2016-06-07 NOTE — Progress Notes (Signed)
eLink Physician-Brief Progress Note Patient Name: Terrance HalimJames Konicek DOB: 1955/06/11 MRN: [id number]   Date of Service  06/07/2016  HPI/Events of Note  Electrolyte panel reviewed. Potassium 3.1 & creatinine 0.5. Patient tolerating oral diet postextubation.   eICU Interventions  1. Total 40 mEq KCl by mouth 2. Repeat electrolytes in a.m.      Intervention Category Intermediate Interventions: Electrolyte abnormality - evaluation and management  Lawanda CousinsJennings Dave Mannes 06/07/2016, 6:58 PM

## 2016-06-07 NOTE — Procedures (Signed)
Extubation Procedure Note  Patient Details:   Name: Terrance Cook DOB: 1955-08-23 MRN: [id number]   Airway Documentation:     Evaluation  O2 sats: stable throughout Complications: No apparent complications Patient did tolerate procedure well. Bilateral Breath Sounds: Clear   Yes   Patient extubated to 2lnc. Vital signs stable at this time. No complications. Patient tolerated well. RN at bedside. RT will continue to monitor.  Ave Filterdkins, Herley Bernardini Williams 06/07/2016, 8:37 AM

## 2016-06-07 NOTE — Progress Notes (Signed)
Pharmacy Antibiotic Note  Terrance Cook is a 61 y.o. male admitted on 06/03/2016 with slurred speech, possible sz. Pt required intubation in ED and vomiting occurred. Pharmacy has been consulted for Unasyn dosing for asp pna. Pt is afebrile and WBC is WNL. Pt had good renal function and dose remains appropriate. PCT<0.1.  Plan: Continue Unasyn 3gm IV q6h  F/u renal fxn, C&S, clinical status and LOT   Height: 5\' 8"  (172.7 cm) Weight: 203 lb 7.8 oz (92.3 kg) IBW/kg (Calculated) : 68.4  Temp (24hrs), Avg:98 F (36.7 C), Min:97.3 F (36.3 C), Max:99.4 F (37.4 C)   Recent Labs Lab 06/03/16 2337 06/03/16 2340 06/03/16 2347 06/04/16 0101 06/04/16 0301 06/04/16 0327 06/04/16 0615 06/05/16 0216 06/06/16 0306 06/06/16 1203  WBC  --  5.0  --   --  4.9  --   --  2.6* 5.5  --   CREATININE 1.00  --  1.06  --  0.81  --   --  0.61  --  0.68  LATICACIDVEN  --   --   --  1.20  --  1.6 1.8  --   --   --     Estimated Creatinine Clearance: 111 mL/min (by C-G formula based on SCr of 0.68 mg/dL).    No Known Allergies  Antimicrobials this admission: 2/8 Unasyn >>   Microbiology results: 2/8 MRSA - NEG   Thank you for allowing pharmacy to be a part of this patient's care.  Lysle Pearlachel Dezirea Mccollister, PharmD, BCPS Pager # [phone] 06/07/2016 12:47 PM

## 2016-06-07 NOTE — Progress Notes (Signed)
200cc of fentanyl drip wasted in sink. Witnessed by Channing MuttersJohny Kjames RN.

## 2016-06-07 NOTE — Progress Notes (Signed)
Dr Molli KnockYacoub notified of pts excessive urine output. >3L during this shift. Verbal order given to collect stat BMET to assess electrolytes. Nursing to continue to monitor.

## 2016-06-07 NOTE — Progress Notes (Signed)
eLink Physician-Brief Progress Note Patient Name: Jacinto HalimJames Mccombie DOB: 03-03-1956 MRN: [id number]   Date of Service  06/07/2016  HPI/Events of Note  Camera check on patient with significant agitation. Reporting back pain. Nursing staff at bedside. Patient deny any abdominal pain, nausea, or chest discomfort. Extubated earlier today. Patient now apologetic regarding his conduct.   eICU Interventions  Administering Toradol 15 mg IV 1      Intervention Category Major Interventions: Delirium, psychosis, severe agitation - evaluation and management  Lawanda CousinsJennings Nestor 06/07/2016, 9:01 PM

## 2016-06-07 NOTE — Progress Notes (Signed)
eLink Physician-Brief Progress Note Patient Name: Terrance HalimJames Wamboldt DOB: [birth year] MRN: [id number]   Date of Service  06/07/2016  HPI/Events of Note  Patient extubated at 8:37 AM today per documentation. Camera check shows patient comfortable in bed with nurse at bedside. No acute distress.   eICU Interventions  Continue ICU monitoring given recent extubation & likely can transition out of the ICU tomorrow      Intervention Category Major Interventions: Respiratory failure - evaluation and management  Lawanda CousinsJennings Namita Yearwood 06/07/2016, 4:46 PM

## 2016-06-07 NOTE — Progress Notes (Signed)
PULMONARY / CRITICAL CARE MEDICINE   Name: Terrance Cook MRN: [id number] DOB: October 31, 1955    ADMISSION DATE:  06/03/2016 CONSULTATION DATE:  06/04/16  REFERRING MD:  Otelia Limes  CHIEF COMPLAINT:  AMS  HISTORY OF PRESENT ILLNESS:  Pt is encephelopathic; therefore, this HPI is obtained from chart review. Terrance Cook is a 61 y.o. male with PMH as outlined below. 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  He was apparently playing cards and then went back to his room when done.  He was then found in his room with slurred speech.  EMS was called and upon their arrival, he was altered and had left leg weakness (though this is reportedly old from prior stroke). He was given 1.4mg  narcan and initially responded but then became altered again.  He received a second dose of narcan in the ED but had no response.  In ED, his mental status worsened and he had sonorous respirations with questionable airway protection.  He was subsequently intubated for airway protection.  He apparently had projectile vomiting during intubation. 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  Of note he also has a hx of seizure disorder and is on keppra and lamictal but no known recent seizures.  Family informed neuro that pt has been known to take a lot of opiates and in the past, he has had slurred speech from this.  PCCM was called for admission.  SUBJECTIVE:  Extubated and will start sips and chips and advance as tolerated    VITAL SIGNS: BP (!) 144/82 (BP Location: Right Leg)   Pulse (!) 47   Temp 98.2 F (36.8 C) (Core (Comment))   Resp 16   Ht 5\' 8"  (1.727 m)   Wt 203 lb 7.8 oz (92.3 kg)   SpO2 97%   BMI 30.94 kg/m   HEMODYNAMICS:    VENTILATOR SETTINGS: Vent Mode: PRVC FiO2 (%):  [30 %] 30 % Set Rate:  [16 bmp] 16 bmp Vt Set:  [540 mL] 540 mL PEEP:   [5 cmH20] 5 cmH20 Plateau Pressure:  [10 cmH20-17 cmH20] 17 cmH20  INTAKE / OUTPUT: I/O last 3 completed shifts: In: 3268.1 [I.V.:2168.1; NG/GT:500; IV Piggyback:600] Out: 3215 [Urine:3215]   PHYSICAL EXAMINATION: General: Middle aged male, NAD Neuro: Awake , alert and cooperative HEENT: [place]/AT. Pupils pinpoint, Cardiovascular: RRR, no M/R/G.  Lungs:  CTA bilaterally. Extubated Abdomen: Obese, BS x 4, soft, NT/ND.  Musculoskeletal: No gross deformities, No pitting edema noted  Skin: Intact, warm, no rashes.  LABS:  BMET  Recent Labs Lab 06/04/16 0301 06/05/16 0216 06/06/16 1203  NA 146* 146* 149*  K 3.8 3.9 4.0  CL 114* 115* 116*  CO2 23 23 26   BUN 8 10 25*  CREATININE 0.81 0.61 0.68  GLUCOSE 103* 142* 81    Electrolytes  Recent Labs Lab 06/04/16 0301 06/05/16 0216  06/05/16 1621 06/06/16 0306 06/06/16 1203 06/06/16 1845  CALCIUM 8.5* 9.1  --   --   --  9.1  --   MG 1.7  --   < > 1.9 2.0  --  2.0  PHOS 3.8  --   < > 4.8* 4.1  --  4.0  < > = values in this interval not displayed.  CBC  Recent Labs Lab 06/04/16 0301 06/05/16 0216 06/06/16 4098  WBC 4.9 2.6* 5.5  HGB 11.4* 10.3* 11.3*  HCT 37.6* 34.1* 36.7*  PLT 155 177 188    Coag's  Recent Labs Lab 06/03/16 2333  APTT 28  INR 1.09    Sepsis Markers  Recent Labs Lab 06/04/16 0101 06/04/16 0327 06/04/16 0615 06/05/16 0907 06/06/16 0306 06/07/16 0140  LATICACIDVEN 1.20 1.6 1.8  --   --   --   PROCALCITON  --   --   --  <0.10 <0.10 <0.10    ABG  Recent Labs Lab 06/04/16 0110 06/04/16 0439 06/04/16 1018  PHART 7.346* 7.350 7.334*  PCO2ART 46.9 42.8 47.1  PO2ART 142.0* 244* 134*    Liver Enzymes  Recent Labs Lab 06/03/16 2347  AST 30  ALT 17  ALKPHOS 37*  BILITOT 0.4  ALBUMIN 4.3    Cardiac Enzymes No results for input(s): TROPONINI, PROBNP in the last 168 hours.  Glucose  Recent Labs Lab 06/06/16 0728 06/06/16 1141 06/06/16 1655 06/06/16 1944  06/07/16 0035 06/07/16 0838  GLUCAP 103* 86 81 85 98 60*    Imaging No results found.   STUDIES:  CT head 02/08 > negative for acute process.  Stable chronic right ACA infarct. CT  Angio 02/09 > Intracranial atherosclerosis with multiple areas of mild-to-moderate stenosis most pronounced in the bilateral posterior;Diminutive right distal ACA circulation likely related to prior Infarction. cerebral arteries. LE duplex 02/09 > Negative for clot  EEG 02/09 >>> indicates diffuse cerebral dysfunction that is non-specific in etiology and can be seen with hypoxic/ischemic injury, toxic/metabolic encephalopathies, or medication effect. Focal slowing over the right parietal region indicates focal cerebral dysfunction likely due ACA stroke in the past  MRI 02/08>>>Negative for acute findings  CXR 02/08>> atelectasis bilaterally   CULTURES: Sputum 02/08 > NGTD  ANTIBIOTICS: Unasyn 02/08 >   SIGNIFICANT EVENTS: 02/08 > admit.  LINES/TUBES: ETT 02/08 >2/10  DISCUSSION: 61 y.o. male from SNF due to old right ACA infarct, now admitted with AMS and required intubation in ED for airway protection.  Initial head CT negative for hemorrhage.  DDx acute infarct vs seizures vs opiate overdose (has been known to take a lot of opiates - only responded to 1 dose narcan with EMS then AMS worsened).  ASSESSMENT / PLAN:  NEUROLOGIC A:   Acute encephalopathy - unclear etiology at this point.  Initial head CT negative for hemorrhage.  DDx includes acute infarction, seizures, overdose (has been known to take a lot of opiates - responded once to narcan with EMS but then mental status worsened in ED). UDS in the past has been positive for benzo's, opiates, THC. Hx CVA, seizures, schizoaffective disorder, MDD, anxiety, chronic pain. UDS and Ethanol this admission negative  EEG non-specific  P:   Off sedation RASS goal:0 Neuro following. Continue preadmission lamotrigine, levetiracetam. Hold preadmission  paroxetine, duloxetine, gabapentin, percocet, quetiapine, sertraline.    PULMONARY A: VDRF - due to inability to protect the airway. Concern for aspiration PNA - had projectile vomiting during intubation. Tobacco dependence. Self extubated, some acute distress - airway swelling Steroids given for vocal cord swelling x 4 doses P:   Extubated 2/11 Abx /cultures per ID section.    CARDIOVASCULAR A:  Hx HTN, HLD, CAD. Bradycardia- asymptomtic (prop) P:  Monitor hemodynamics.  clonidine, ASA. If needed use hydralazine Hold preadmission amlodipine, atorvastatin, carvedilol, fenofibrate, lisinopril, nitro.Marland Kitchen.  RENAL Lab Results  Component Value Date   CREATININE 0.68 06/06/2016   CREATININE 0.61 06/05/2016   CREATININE 0.81 06/04/2016  Recent Labs Lab 06/04/16 0301 06/05/16 0216 06/06/16 1203  K 3.8 3.9 4.0   '  Recent Labs Lab 06/04/16 0301 06/05/16 0216 06/06/16 1203  NA 146* 146* 149*    A:   Hypernatremia P:   D5 .45saline  BMP   GASTROINTESTINAL A:   GI prophylaxis. Nutrition. P:   Continue preadmission PPI. Consider Tube feeds vs. Diet if extubating   HEMATOLOGIC A:   R/o DVT with Venous Doppler  VTE Prophylaxis. HIV negative  P:  SCD's / heparin. CBC in AM.   INFECTIOUS A:   Concern for aspiration PNA - had projectile vomiting during intubation. CXR with bibasilar atelectasis  P:   Abx as above (unasyn).  Follow cultures as above.  ENDOCRINE. CBG (last 3)   Recent Labs  06/06/16 1944 06/07/16 0035 06/07/16 0838  GLUCAP 85 98 60*     A:   Hx DM.   P:   SSI. Hold preadmission metformin. Start diet 2/11   App ctt 30 miin  Steve Minor ACNP [facility]ack PCCM Pager [phone] till 3 pm If no answer page [phone] 06/07/2016, 9:05 AM  Attending Note:  61 year old male with PMH above presenting to PCCM with opioid overdose and was intubated for airway protection.  Much more alert and interactive this AM, following  commands, weaning and with clear lung exam.  I reviewed CXR myself, ETT ok.  Will extubate today.  No need for swallow evaluation.  Advanced diet as tolerated.  PT/OT and OOB to chair.  Continue Keppra.  D/c all sedating medications.  Hold in ICU today and likely to floor in AM.  The patient is critically ill with multiple organ systems failure and requires high complexity decision making for assessment and support, frequent evaluation and titration of therapies, application of advanced monitoring technologies and extensive interpretation of multiple databases.   Critical Care Time devoted to patient care services described in this note is  35  Minutes. This time reflects time of care of this signee Dr Koren Bound. This critical care time does not reflect procedure time, or teaching time or supervisory time of PA/NP/Med student/Med Resident etc but could involve care discussion time.  Alyson Reedy, M.D. [facility] Pulmonary/Critical Care Medicine. Pager: [phone]. After hours pager: [phone].

## 2016-06-08 ENCOUNTER — Inpatient Hospital Stay (HOSPITAL_COMMUNITY): Payer: Medicare Other

## 2016-06-08 DIAGNOSIS — I10 Essential (primary) hypertension: Secondary | ICD-10-CM

## 2016-06-08 DIAGNOSIS — F411 Generalized anxiety disorder: Secondary | ICD-10-CM

## 2016-06-08 LAB — BASIC METABOLIC PANEL
ANION GAP: 8 (ref 5–15)
BUN: 10 mg/dL (ref 6–20)
CO2: 30 mmol/L (ref 22–32)
Calcium: 8.6 mg/dL — ABNORMAL LOW (ref 8.9–10.3)
Chloride: 105 mmol/L (ref 101–111)
Creatinine, Ser: 0.58 mg/dL — ABNORMAL LOW (ref 0.61–1.24)
GFR calc Af Amer: >60 mL/min (ref >60)
Glucose, Bld: 95 mg/dL (ref 65–99)
POTASSIUM: 3.2 mmol/L — AB (ref 3.5–5.1)
Sodium: 143 mmol/L (ref 135–145)

## 2016-06-08 LAB — CBC
HCT: 36.8 % — ABNORMAL LOW (ref 39.0–52.0)
Hemoglobin: 11.7 g/dL — ABNORMAL LOW (ref 13.0–17.0)
MCH: 25.7 pg — ABNORMAL LOW (ref 26.0–34.0)
MCHC: 31.8 g/dL (ref 30.0–36.0)
MCV: 80.9 fL (ref 78.0–100.0)
PLATELETS: 148 10*3/uL — AB (ref 150–400)
RBC: 4.55 MIL/uL (ref 4.22–5.81)
RDW: 15.2 % (ref 11.5–15.5)
WBC: 7.3 10*3/uL (ref 4.0–10.5)

## 2016-06-08 LAB — GLUCOSE, CAPILLARY
GLUCOSE-CAPILLARY: 75 mg/dL (ref 65–99)
GLUCOSE-CAPILLARY: 99 mg/dL (ref 65–99)
Glucose-Capillary: 82 mg/dL (ref 65–99)
Glucose-Capillary: 104 mg/dL — ABNORMAL HIGH (ref 65–99)
Glucose-Capillary: 76 mg/dL (ref 65–99)

## 2016-06-08 LAB — MAGNESIUM: MAGNESIUM: 1.9 mg/dL (ref 1.7–2.4)

## 2016-06-08 MED ORDER — LISINOPRIL 40 MG PO TABS
40.0000 mg | ORAL_TABLET | Freq: Every day | ORAL | Status: DC
  Administered 2016-06-08 – 2016-06-12 (×5): 40 mg via ORAL
  Filled 2016-06-08 (×5): qty 1

## 2016-06-08 MED ORDER — AMLODIPINE BESYLATE 5 MG PO TABS
5.0000 mg | ORAL_TABLET | Freq: Every day | ORAL | Status: DC
  Administered 2016-06-08 – 2016-06-12 (×5): 5 mg via ORAL
  Filled 2016-06-08 (×5): qty 1

## 2016-06-08 MED ORDER — GABAPENTIN 600 MG PO TABS
300.0000 mg | ORAL_TABLET | Freq: Two times a day (BID) | ORAL | Status: DC
  Filled 2016-06-08: qty 0.5

## 2016-06-08 MED ORDER — QUETIAPINE FUMARATE 25 MG PO TABS
25.0000 mg | ORAL_TABLET | Freq: Every day | ORAL | Status: DC
  Administered 2016-06-08 – 2016-06-11 (×4): 25 mg via ORAL
  Filled 2016-06-08 (×4): qty 1

## 2016-06-08 MED ORDER — POTASSIUM CHLORIDE 20 MEQ/15ML (10%) PO SOLN
40.0000 meq | Freq: Once | ORAL | Status: AC
  Administered 2016-06-08: 40 meq
  Filled 2016-06-08: qty 30

## 2016-06-08 MED ORDER — GABAPENTIN 300 MG PO CAPS
300.0000 mg | ORAL_CAPSULE | Freq: Two times a day (BID) | ORAL | Status: DC
  Administered 2016-06-08 – 2016-06-12 (×9): 300 mg via ORAL
  Filled 2016-06-08 (×9): qty 1

## 2016-06-08 MED ORDER — PAROXETINE HCL 20 MG PO TABS
10.0000 mg | ORAL_TABLET | Freq: Every day | ORAL | Status: DC
  Administered 2016-06-08 – 2016-06-12 (×5): 10 mg via ORAL
  Filled 2016-06-08 (×5): qty 1

## 2016-06-08 MED ORDER — ATORVASTATIN CALCIUM 20 MG PO TABS
20.0000 mg | ORAL_TABLET | Freq: Every day | ORAL | Status: DC
  Administered 2016-06-08 – 2016-06-11 (×4): 20 mg via ORAL
  Filled 2016-06-08 (×4): qty 1

## 2016-06-08 MED ORDER — CARVEDILOL 3.125 MG PO TABS
3.1250 mg | ORAL_TABLET | Freq: Two times a day (BID) | ORAL | Status: DC
  Administered 2016-06-09 – 2016-06-12 (×7): 3.125 mg via ORAL
  Filled 2016-06-08 (×9): qty 1

## 2016-06-08 NOTE — Progress Notes (Signed)
CSW continues to follow for disposition and [facility] [facility] when medically appropriate.    Enos FlingAshley Clare Fennimore, MSW, LCSW [facility] Clinical Social Worker [phone]

## 2016-06-08 NOTE — Progress Notes (Signed)
PULMONARY / CRITICAL CARE MEDICINE   Name: Terrance Cook MRN: [id number] DOB: 25-Jul-1955    ADMISSION DATE:  06/03/2016 CONSULTATION DATE:  06/04/16  REFERRING MD:  Otelia Limes  CHIEF COMPLAINT:  AMS  HISTORY OF PRESENT ILLNESS:  Pt is encephelopathic; therefore, this HPI is obtained from chart review. Terrance Cook is a 61 y.o. male with PMH as outlined below. 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  He was apparently playing cards and then went back to his room when done.  He was then found in his room with slurred speech.  EMS was called and upon their arrival, he was altered and had left leg weakness (though this is reportedly old from prior stroke). He was given 1.4mg  narcan and initially responded but then became altered again.  He received a second dose of narcan in the ED but had no response.  In ED, his mental status worsened and he had sonorous respirations with questionable airway protection.  He was subsequently intubated for airway protection.  He apparently had projectile vomiting during intubation. 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  Of note he also has a hx of seizure disorder and is on keppra and lamictal but no known recent seizures.  Family informed neuro that pt has been known to take a lot of opiates and in the past, he has had slurred speech from this.  PCCM was called for admission.  SUBJECTIVE:  Extubated and doing well, no new complaints, has not mobilized    VITAL SIGNS: BP (!) 146/66   Pulse (!) 51   Temp 98.7 F (37.1 C) (Oral)   Resp 14   Ht 5\' 8"  (1.727 m)   Wt 86.7 kg (191 lb 2.2 oz)   SpO2 96%   BMI 29.06 kg/m   HEMODYNAMICS:    VENTILATOR SETTINGS:    INTAKE / OUTPUT: I/O last 3 completed shifts: In: 2088.3 [P.O.:75; I.V.:1003.3; Other:120; NG/GT:290; IV Piggyback:600] Out: 6885  [Urine:6885]   PHYSICAL EXAMINATION: General: Middle aged male, resting comfortably in bed, NAD Neuro: A&O, moving all ext to command HEENT: [place]/AT, PERRL, EOM-I and MMM Cardiovascular: RRR, Nl S1/S2, -M/R/G. Lungs:  CTA bilaterally  Abdomen: Obese, BS x 4, soft, NT/ND.  Musculoskeletal: No gross deformities, No pitting edema noted  Skin: Intact, warm, no rashes.  LABS:  BMET  Recent Labs Lab 06/06/16 1203 06/07/16 1612 06/08/16 0141  NA 149* 145 143  K 4.0 3.1* 3.2*  CL 116* 105 105  CO2 26 29 30   BUN 25* 13 10  CREATININE 0.68 0.56* 0.58*  GLUCOSE 81 77 95    Electrolytes  Recent Labs Lab 06/05/16 1621 06/06/16 0306 06/06/16 1203 06/06/16 1845 06/07/16 1612 06/08/16 0141  CALCIUM  --   --  9.1  --  8.9 8.6*  MG 1.9 2.0  --  2.0  --  1.9  PHOS 4.8* 4.1  --  4.0  --   --     CBC  Recent Labs Lab 06/05/16 0216 06/06/16 0306 06/08/16 0141  WBC 2.6* 5.5 7.3  HGB 10.3* 11.3* 11.7*  HCT 34.1* 36.7* 36.8*  PLT 177 188 148*    Coag's  Recent Labs Lab 06/03/16 2333  APTT 28  INR 1.09    Sepsis Markers  Recent Labs Lab 06/04/16 0101 06/04/16 0327  06/04/16 0615 06/05/16 0907 06/06/16 0306 06/07/16 0140  LATICACIDVEN 1.20 1.6 1.8  --   --   --   PROCALCITON  --   --   --  <0.10 <0.10 <0.10    ABG  Recent Labs Lab 06/04/16 0110 06/04/16 0439 06/04/16 1018  PHART 7.346* 7.350 7.334*  PCO2ART 46.9 42.8 47.1  PO2ART 142.0* 244* 134*    Liver Enzymes  Recent Labs Lab 06/03/16 2347  AST 30  ALT 17  ALKPHOS 37*  BILITOT 0.4  ALBUMIN 4.3    Cardiac Enzymes No results for input(s): TROPONINI, PROBNP in the last 168 hours.  Glucose  Recent Labs Lab 06/07/16 0920 06/07/16 1521 06/07/16 2000 06/07/16 2340 06/08/16 0440 06/08/16 0718  GLUCAP 98 96 119* 96 82 75    Imaging Dg Chest Port 1 View  Result Date: 06/08/2016 CLINICAL DATA:  Respiratory failure, short of breath EXAM: PORTABLE CHEST 1 VIEW COMPARISON:   06/05/2016 FINDINGS: Endotracheal tube and NG tube removed. Improved aeration in the lung bases. Mild residual airspace disease in the right lung base. IMPRESSION: Endotracheal tube and NG tube removed. Improved airspace disease in the bases. Mild residual atelectasis or infiltrate in the right lung base. Electronically Signed   By: Marlan Palauharles  Clark M.D.   On: 06/08/2016 06:39     STUDIES:  CT head 02/08 > negative for acute process.  Stable chronic right ACA infarct. CT  Angio 02/09 > Intracranial atherosclerosis with multiple areas of mild-to-moderate stenosis most pronounced in the bilateral posterior;Diminutive right distal ACA circulation likely related to prior Infarction. cerebral arteries. LE duplex 02/09 > Negative for clot  EEG 02/09 >>> indicates diffuse cerebral dysfunction that is non-specific in etiology and can be seen with hypoxic/ischemic injury, toxic/metabolic encephalopathies, or medication effect. Focal slowing over the right parietal region indicates focal cerebral dysfunction likely due ACA stroke in the past  MRI 02/08>>>Negative for acute findings  CXR 02/08>> atelectasis bilaterally   CULTURES: Sputum 02/08 > NGTD  ANTIBIOTICS: Unasyn 02/08 >   SIGNIFICANT EVENTS: 02/08 > admit.  LINES/TUBES: ETT 02/08 >2/10  I reviewed CXR myself, no acute disease noted  DISCUSSION: 61 y.o. male from SNF due to old right ACA infarct, now admitted with AMS and required intubation in ED for airway protection.  Initial head CT negative for hemorrhage.  DDx acute infarct vs seizures vs opiate overdose (has been known to take a lot of opiates - only responded to 1 dose narcan with EMS then AMS worsened).  ASSESSMENT / PLAN:  NEUROLOGIC A:   Acute encephalopathy - unclear etiology at this point.  Initial head CT negative for hemorrhage.  DDx includes acute infarction, seizures, overdose (has been known to take a lot of opiates - responded once to narcan with EMS but then mental  status worsened in ED). UDS in the past has been positive for benzo's, opiates, THC. Hx CVA, seizures, schizoaffective disorder, MDD, anxiety, chronic pain. UDS and Ethanol this admission negative  EEG non-specific  P:   D/C sedation Neuro following. Continue preadmission lamotrigine, levetiracetam. Restart preadmission paroxetine, duloxetine, gabapentin, percocet, quetiapine, sertraline.   PULMONARY A: VDRF - due to inability to protect the airway. Concern for aspiration PNA - had projectile vomiting during intubation. Tobacco dependence. Self extubated, some acute distress - airway swelling Steroids given for vocal cord swelling x 4 doses P:   Abx /cultures per ID section.  Titrate O2 for sat of 88-92% Monitor for airway protection  CARDIOVASCULAR A:  Hx HTN, HLD,  CAD. Bradycardia- asymptomtic (prop) P:  Monitor hemodynamics. Clonidine, ASA. If needed use hydralazine Restart preadmission amlodipine, atorvastatin, carvedilol, fenofibrate, lisinopril.  RENAL Lab Results  Component Value Date   CREATININE 0.58 (L) 06/08/2016   CREATININE 0.56 (L) 06/07/2016   CREATININE 0.68 06/06/2016   Recent Labs Lab 06/06/16 1203 06/07/16 1612 06/08/16 0141  K 4.0 3.1* 3.2*   Recent Labs Lab 06/06/16 1203 06/07/16 1612 06/08/16 0141  NA 149* 145 143   A:   Hypernatremia P:   KVO IVF BMP in AM Replace electrolytes as indicated  GASTROINTESTINAL A:   GI prophylaxis. Nutrition. P:   Continue preadmission PPI. Diet as ordered  HEMATOLOGIC A:   R/o DVT with Venous Doppler  VTE Prophylaxis. HIV negative  P:  SCD's / heparin. CBC in AM.  INFECTIOUS A:   Concern for aspiration PNA - had projectile vomiting during intubation. CXR with bibasilar atelectasis  P:   Abx as above (unasyn).  Follow cultures as above.  ENDOCRINE. CBG (last 3)   Recent Labs  06/07/16 2340 06/08/16 0440 06/08/16 0718  GLUCAP 96 82 75   A:   Hx DM.   P:   SSI. Hold  preadmission metformin.  Discussed with [facility]-MD, transfer to tele and to [facility] with PCCM off 2/13  Alyson Reedy, M.D. [facility] Pulmonary/Critical Care Medicine. Pager: [phone]. After hours pager: [phone].

## 2016-06-08 NOTE — Progress Notes (Signed)
eLink Physician-Brief Progress Note Patient Name: Jacinto HalimJames Boehner DOB: [birth year] MRN: [id number]   Date of Service  06/08/2016  HPI/Events of Note    eICU Interventions  Potassium replaced     Intervention Category Intermediate Interventions: Electrolyte abnormality - evaluation and management  Dontavian Marchi S. 06/08/2016, 3:52 AM

## 2016-06-09 DIAGNOSIS — T40601A Poisoning by unspecified narcotics, accidental (unintentional), initial encounter: Secondary | ICD-10-CM | POA: Diagnosis present

## 2016-06-09 LAB — GLUCOSE, CAPILLARY
GLUCOSE-CAPILLARY: 125 mg/dL — AB (ref 65–99)
GLUCOSE-CAPILLARY: 88 mg/dL (ref 65–99)
GLUCOSE-CAPILLARY: 95 mg/dL (ref 65–99)
GLUCOSE-CAPILLARY: 98 mg/dL (ref 65–99)
Glucose-Capillary: 75 mg/dL (ref 65–99)
Glucose-Capillary: 98 mg/dL (ref 65–99)
Glucose-Capillary: 83 mg/dL (ref 65–99)

## 2016-06-09 LAB — BASIC METABOLIC PANEL
ANION GAP: 11 (ref 5–15)
BUN: 14 mg/dL (ref 6–20)
CALCIUM: 9.2 mg/dL (ref 8.9–10.3)
CO2: 24 mmol/L (ref 22–32)
Chloride: 107 mmol/L (ref 101–111)
Creatinine, Ser: 0.72 mg/dL (ref 0.61–1.24)
Glucose, Bld: 123 mg/dL — ABNORMAL HIGH (ref 65–99)
POTASSIUM: 3 mmol/L — AB (ref 3.5–5.1)
SODIUM: 142 mmol/L (ref 135–145)

## 2016-06-09 LAB — MAGNESIUM: MAGNESIUM: 2 mg/dL (ref 1.7–2.4)

## 2016-06-09 LAB — CBC
HCT: 38.9 % — ABNORMAL LOW (ref 39.0–52.0)
Hemoglobin: 12.3 g/dL — ABNORMAL LOW (ref 13.0–17.0)
MCH: 25.4 pg — ABNORMAL LOW (ref 26.0–34.0)
MCHC: 31.6 g/dL (ref 30.0–36.0)
MCV: 80.4 fL (ref 78.0–100.0)
PLATELETS: 164 10*3/uL (ref 150–400)
RBC: 4.84 MIL/uL (ref 4.22–5.81)
RDW: 15.2 % (ref 11.5–15.5)
WBC: 5.9 10*3/uL (ref 4.0–10.5)

## 2016-06-09 LAB — PHOSPHORUS: Phosphorus: 4.9 mg/dL — ABNORMAL HIGH (ref 2.5–4.6)

## 2016-06-09 MED ORDER — ACETAMINOPHEN 325 MG PO TABS: 650.0000 mg | ORAL_TABLET | Freq: Four times a day (QID) | ORAL | Status: DC | PRN

## 2016-06-09 MED ORDER — ACETAMINOPHEN-CODEINE #3 300-30 MG PO TABS
1.0000 | ORAL_TABLET | Freq: Three times a day (TID) | ORAL | Status: DC | PRN
  Administered 2016-06-09 – 2016-06-11 (×7): 1 via ORAL
  Filled 2016-06-09 (×7): qty 1

## 2016-06-09 MED ORDER — AMOXICILLIN-POT CLAVULANATE 500-125 MG PO TABS: 1.0000 | ORAL_TABLET | Freq: Three times a day (TID) | ORAL | 0 refills | Status: AC

## 2016-06-09 MED ORDER — AMOXICILLIN-POT CLAVULANATE 500-125 MG PO TABS
1.0000 | ORAL_TABLET | Freq: Three times a day (TID) | ORAL | Status: DC
  Administered 2016-06-09 – 2016-06-12 (×9): 500 mg via ORAL
  Filled 2016-06-09 (×11): qty 1

## 2016-06-09 MED ORDER — LIDOCAINE 5 % EX PTCH
1.0000 | MEDICATED_PATCH | CUTANEOUS | Status: AC
  Administered 2016-06-10: 1 via TRANSDERMAL
  Filled 2016-06-09: qty 1

## 2016-06-09 MED ORDER — METHOCARBAMOL 500 MG PO TABS
500.0000 mg | ORAL_TABLET | Freq: Three times a day (TID) | ORAL | 0 refills | Status: AC | PRN
Start: 2016-06-09 — End: ?

## 2016-06-09 MED ORDER — METHOCARBAMOL 500 MG PO TABS
500.0000 mg | ORAL_TABLET | Freq: Three times a day (TID) | ORAL | Status: DC | PRN
  Administered 2016-06-09: 500 mg via ORAL
  Filled 2016-06-09: qty 1

## 2016-06-09 MED ORDER — METHOCARBAMOL 500 MG PO TABS
250.0000 mg | ORAL_TABLET | Freq: Once | ORAL | Status: AC | PRN
  Administered 2016-06-10: 250 mg via ORAL
  Filled 2016-06-09: qty 1

## 2016-06-09 MED ORDER — OXYCODONE-ACETAMINOPHEN 5-325 MG PO TABS
1.0000 | ORAL_TABLET | ORAL | Status: AC
  Administered 2016-06-09: 1 via ORAL
  Filled 2016-06-09: qty 1

## 2016-06-09 MED ORDER — CLONIDINE HCL 0.1 MG/24HR TD PTWK: 0.1000 mg | MEDICATED_PATCH | TRANSDERMAL | 0 refills | Status: AC

## 2016-06-09 MED ORDER — POTASSIUM CHLORIDE CRYS ER 20 MEQ PO TBCR
40.0000 meq | EXTENDED_RELEASE_TABLET | ORAL | Status: AC
  Administered 2016-06-09 (×2): 40 meq via ORAL
  Filled 2016-06-09 (×2): qty 2

## 2016-06-09 NOTE — NC FL2 (Signed)
[place] MEDICAID FL2 LEVEL OF CARE SCREENING TOOL     IDENTIFICATION  Patient Name: Terrance Cook Birthdate: 1955/04/29 Sex: male Admission Date (Current Location): 06/03/2016  [facility] [place]   Facility and Address:  [facility] [place], [address], [place], [place] [address]      Provider Number: [id number]  Attending Physician Name and Address:  Rolly SalterPranav M Patel, MD  Relative Name and Phone Number:       Current Level of Care: Hospital Recommended Level of Care: Skilled Nursing Facility Prior Approval Number:    Date Approved/Denied:   PASRR Number:    Discharge Plan: SNF    Current Diagnoses: Patient Active Problem List   Diagnosis Date Noted  . Slurred speech   . Leg edema, left   . Encounter for orogastric (OG) tube placement   . Acute on chronic respiratory failure with hypoxemia (HCC)   . Encounter for intubation   . Chest pain   . Typical angina (HCC) 01/04/2015  . Hyperlipidemia 01/04/2015  . Tobacco use 01/04/2015  . MDD (major depressive disorder) 01/04/2015  . Anxiety 01/04/2015  . Encephalopathy 01/04/2015  . Schizoaffective disorder (HCC) 01/04/2015  . H/O: CVA (cerebrovascular accident) 01/04/2015  . Chronic pain 01/04/2015  . CAD (coronary artery disease) 01/04/2015  . Angina at rest [facility](HCC) 01/04/2015  . Postoperative anemia due to acute blood loss   . Femur fracture (HCC)   . Femur fracture, left (HCC) 08/14/2014  . Hip fracture (HCC) 08/14/2014  . Acute encephalopathy 08/14/2014  . Fall   . Left hemiparesis (HCC) 08/09/2014  . Diabetes mellitus type 2, controlled, without complications (HCC) 08/09/2014  . Essential hypertension 08/09/2014  . Acute respiratory failure with hypercapnia (HCC) 08/09/2014  . Altered mental status 08/08/2014    Orientation RESPIRATION BLADDER Height & Weight     Self, Time, Situation, Place  Normal Continent Weight: 86.2 kg (190 lb 0.6 oz) Height:  5\' 8"  (172.7 cm)   BEHAVIORAL SYMPTOMS/MOOD NEUROLOGICAL BOWEL NUTRITION STATUS    Convulsions/Seizures Continent Diet (Regular)  AMBULATORY STATUS COMMUNICATION OF NEEDS Skin   Limited Assist Verbally Normal                       Personal Care Assistance Level of Assistance  Bathing, Feeding, Dressing Bathing Assistance: Limited assistance Feeding assistance: Independent Dressing Assistance: Limited assistance     Functional Limitations Info             SPECIAL CARE FACTORS FREQUENCY  PT (By licensed PT)     PT Frequency: 5x/week              Contractures      Additional Factors Info  Code Status, Allergies Code Status Info: Full Allergies Info: NKA           Current Medications (06/09/2016):  This is the current hospital active medication list Current Facility-Administered Medications  Medication Dose Route Frequency Provider Last Rate Last Dose  . acetaminophen (TYLENOL) tablet 650 mg  650 mg Oral Q6H PRN Rolly SalterPranav M Patel, MD      . acetaminophen-codeine (TYLENOL #3) 300-30 MG per tablet 1 tablet  1 tablet Oral Q8H PRN Rolly SalterPranav M Patel, MD      . amLODipine (NORVASC) tablet 5 mg  5 mg Oral Daily Alyson ReedyWesam G Yacoub, MD   5 mg at 06/09/16 0842  . amoxicillin-clavulanate (AUGMENTIN) 500-125 MG per tablet 500 mg  1 tablet Oral Q8H Pranav  Carolanne Grumbling, MD      . aspirin chewable tablet 81 mg  81 mg Oral Daily Roslynn Amble, MD   81 mg at 06/09/16 1610  . atorvastatin (LIPITOR) tablet 20 mg  20 mg Oral q1800 Alyson Reedy, MD   20 mg at 06/08/16 1800  . carvedilol (COREG) tablet 3.125 mg  3.125 mg Oral BID WC Alyson Reedy, MD   3.125 mg at 06/09/16 0842  . clonazePAM (KLONOPIN) tablet 0.25 mg  0.25 mg Oral BID Alyson Reedy, MD   0.25 mg at 06/09/16 0842  . cloNIDine (CATAPRES) tablet 0.1 mg  0.1 mg Oral BID Roslynn Amble, MD   0.1 mg at 06/09/16 0841  . gabapentin (NEURONTIN) capsule 300 mg  300 mg Oral BID Renaee Munda, RPH   300 mg at 06/09/16 0842  . heparin injection 5,000  Units  5,000 Units Subcutaneous Q8H Rahul P Desai, PA-C   5,000 Units at 06/09/16 0554  . hydrALAZINE (APRESOLINE) injection 10-40 mg  10-40 mg Intravenous Q4H PRN Lupita Leash, MD   20 mg at 06/08/16 1623  . insulin aspart (novoLOG) injection 0-20 Units  0-20 Units Subcutaneous Q4H Rahul P Desai, PA-C   3 Units at 06/09/16 0840  . lamoTRIgine (LAMICTAL) tablet 25 mg  25 mg Oral Daily Roslynn Amble, MD   25 mg at 06/09/16 [phone]  . levETIRAcetam (KEPPRA) tablet 1,000 mg  1,000 mg Oral BID Roslynn Amble, MD   1,000 mg at 06/09/16 0841  . lisinopril (PRINIVIL,ZESTRIL) tablet 40 mg  40 mg Oral Daily Alyson Reedy, MD   40 mg at 06/09/16 0840  . methocarbamol (ROBAXIN) tablet 500 mg  500 mg Oral Q8H PRN Rolly Salter, MD      . pantoprazole (PROTONIX) EC tablet 40 mg  40 mg Oral Daily Roslynn Amble, MD   40 mg at 06/09/16 0840  . PARoxetine (PAXIL) tablet 10 mg  10 mg Oral Daily Alyson Reedy, MD   10 mg at 06/09/16 0840  . QUEtiapine (SEROQUEL) tablet 25 mg  25 mg Oral QHS Alyson Reedy, MD   25 mg at 06/08/16 2113     Discharge Medications: Please see discharge summary for a list of discharge medications.  Relevant Imaging Results:  Relevant Lab Results:   Additional Information SSN: [id number] [place], [place]

## 2016-06-09 NOTE — NC FL2 (Signed)
[place] MEDICAID FL2 LEVEL OF CARE SCREENING TOOL     IDENTIFICATION  Patient Name: Terrance Cook Birthdate: 1955/08/06 Sex: male Admission Date (Current Location): 06/03/2016  [facility] and [place] Number:  Producer, television/film/video and Address:  [facility] [place], [address] [place], [place] [address]      Provider Number: [id number]  Attending Physician Name and Address:  Rolly Salter, MD  Relative Name and Phone Number:       Current Level of Care: Hospital Recommended Level of Care: Assisted Living Facility Prior Approval Number:    Date Approved/Denied:   PASRR Number:    Discharge Plan: Other (Comment) (ALF)    Current Diagnoses: Patient Active Problem List   Diagnosis Date Noted  . Narcotic overdose 06/09/2016  . Slurred speech   . Leg edema, left   . Encounter for orogastric (OG) tube placement   . Acute on chronic respiratory failure with hypoxemia (HCC)   . Encounter for intubation   . Chest pain   . Typical angina (HCC) 01/04/2015  . Hyperlipidemia 01/04/2015  . Tobacco use 01/04/2015  . MDD (major depressive disorder) 01/04/2015  . Anxiety 01/04/2015  . Encephalopathy 01/04/2015  . Schizoaffective disorder (HCC) 01/04/2015  . H/O: CVA (cerebrovascular accident) 01/04/2015  . Chronic pain 01/04/2015  . CAD (coronary artery disease) 01/04/2015  . Angina at rest [facility]) 01/04/2015  . Postoperative anemia due to acute blood loss   . Femur fracture (HCC)   . Femur fracture, left (HCC) 08/14/2014  . Hip fracture (HCC) 08/14/2014  . Acute encephalopathy 08/14/2014  . Fall   . Left hemiparesis (HCC) 08/09/2014  . Diabetes mellitus type 2, controlled, without complications (HCC) 08/09/2014  . Essential hypertension 08/09/2014  . Acute respiratory failure with hypercapnia (HCC) 08/09/2014  . Altered mental status 08/08/2014    Orientation RESPIRATION BLADDER Height & Weight     Self, Time, Situation, Place  Normal Continent Weight:  86.2 kg (190 lb 0.6 oz) Height:  5\' 8"  (172.7 cm)  BEHAVIORAL SYMPTOMS/MOOD NEUROLOGICAL BOWEL NUTRITION STATUS    Convulsions/Seizures Continent Diet (Regular)  AMBULATORY STATUS COMMUNICATION OF NEEDS Skin   Supervision Verbally Normal                       Personal Care Assistance Level of Assistance  Bathing, Feeding, Dressing Bathing Assistance: Limited assistance Feeding assistance: Independent Dressing Assistance: Limited assistance     Functional Limitations Info             SPECIAL CARE FACTORS FREQUENCY  PT (By licensed PT)     PT Frequency: 5x/week              Contractures      Additional Factors Info  Code Status, Allergies Code Status Info: Full Allergies Info: NKA           Current Medications (06/09/2016):   Discharge Medications: START taking these medications   Details  amoxicillin-clavulanate (AUGMENTIN) 500-125 MG tablet Take 1 tablet (500 mg total) by mouth every 8 (eight) hours. Qty: 6 tablet, Refills: 0    methocarbamol (ROBAXIN) 500 MG tablet Take 1 tablet (500 mg total) by mouth every 8 (eight) hours as needed for muscle spasms. Qty: 15 tablet, Refills: 0          CONTINUE these medications which have CHANGED   Details  cloNIDine (CATAPRES - DOSED IN MG/24 HR) 0.1 mg/24hr patch Place 1 patch (0.1 mg total) onto the  skin once a week. Qty: 3 patch, Refills: 0          CONTINUE these medications which have NOT CHANGED   Details  amLODipine (NORVASC) 10 MG tablet Take 5 mg by mouth daily.     aspirin 81 MG chewable tablet Chew 81 mg by mouth daily.    atorvastatin (LIPITOR) 20 MG tablet Take 20 mg by mouth daily at 6 PM.     carvedilol (COREG) 3.125 MG tablet Take 1 tablet (3.125 mg total) by mouth 2 (two) times daily with a meal. Qty: 60 tablet, Refills: 1    clonazePAM (KLONOPIN) 0.5 MG tablet Take 0.25 mg by mouth 2 (two) times daily as needed for anxiety.    diclofenac sodium (VOLTAREN) 1 % GEL Apply  2 g topically 2 (two) times daily. Apply to lower back, left hip, and left knee    febuxostat (ULORIC) 40 MG tablet Take 40 mg by mouth daily.    furosemide (LASIX) 20 MG tablet Take 40 mg by mouth daily.    gabapentin (NEURONTIN) 300 MG capsule Take 300 mg by mouth 2 (two) times daily.    ibuprofen (ADVIL,MOTRIN) 200 MG tablet Take 200 mg by mouth 2 (two) times daily.    !! lamoTRIgine (LAMICTAL) 100 MG tablet Take 50 mg by mouth every evening.    !! lamoTRIgine (LAMICTAL) 25 MG tablet Take 25 mg by mouth every morning.     levETIRAcetam (KEPPRA) 1000 MG tablet Take 1,000 mg by mouth 2 (two) times daily.    lisinopril (PRINIVIL,ZESTRIL) 20 MG tablet Take 40 mg by mouth daily.    Melatonin 1 MG TABS Take 1 mg by mouth at bedtime.    nitroGLYCERIN (NITROSTAT) 0.4 MG SL tablet Place 0.4 mg under the tongue every 5 (five) minutes as needed for chest pain.    pantoprazole (PROTONIX) 40 MG tablet Take 1 tablet (40 mg total) by mouth daily at 6 (six) AM. Qty: 30 tablet, Refills: 1    PARoxetine (PAXIL) 10 MG tablet Take 10 mg by mouth daily.    QUEtiapine (SEROQUEL) 25 MG tablet Take 25 mg by mouth at bedtime.    senna (SENOKOT) 8.6 MG TABS tablet Take 2 tablets by mouth daily.    tamsulosin (FLOMAX) 0.4 MG CAPS capsule Take 0.4 mg by mouth daily.    bisacodyl (DULCOLAX) 5 MG EC tablet Take 1 tablet (5 mg total) by mouth daily as needed for moderate constipation. Qty: 30 tablet, Refills: 0    nicotine (NICODERM CQ - DOSED IN MG/24 HOURS) 21 mg/24hr patch Place 1 patch (21 mg total) onto the skin daily. Qty: 28 patch, Refills: 0     !! - Potential duplicate medications found. Please discuss with provider.       STOP taking these medications     oxyCODONE (OXY IR/ROXICODONE) 5 MG immediate release tablet      oxyCODONE-acetaminophen (PERCOCET) 5-325 MG tablet        Relevant Imaging Results:  Relevant Lab Results:   Additional  Information SSN: [id number] Terrance Cook  Terrance Cook S [name], Terrance Cook

## 2016-06-09 NOTE — NC FL2 (Signed)
[place] MEDICAID FL2 LEVEL OF CARE SCREENING TOOL     IDENTIFICATION  Patient Name: Terrance Cook Birthdate: 1955/06/25 Sex: male Admission Date (Current Location): 06/03/2016  [facility] and [place] Number:  Producer, television/film/video and Address:  [facility] [place], [address] [place], [place] [address]      Provider Number: [id number]  Attending Physician Name and Address:  Rolly Salter, MD  Relative Name and Phone Number:       Current Level of Care: Hospital Recommended Level of Care: Assisted Living Facility Prior Approval Number:    Date Approved/Denied:   PASRR Number:    Discharge Plan: Other (Comment) (ALF)    Current Diagnoses: Patient Active Problem List   Diagnosis Date Noted  . Slurred speech   . Leg edema, left   . Encounter for orogastric (OG) tube placement   . Acute on chronic respiratory failure with hypoxemia (HCC)   . Encounter for intubation   . Chest pain   . Typical angina (HCC) 01/04/2015  . Hyperlipidemia 01/04/2015  . Tobacco use 01/04/2015  . MDD (major depressive disorder) 01/04/2015  . Anxiety 01/04/2015  . Encephalopathy 01/04/2015  . Schizoaffective disorder (HCC) 01/04/2015  . H/O: CVA (cerebrovascular accident) 01/04/2015  . Chronic pain 01/04/2015  . CAD (coronary artery disease) 01/04/2015  . Angina at rest [facility]) 01/04/2015  . Postoperative anemia due to acute blood loss   . Femur fracture (HCC)   . Femur fracture, left (HCC) 08/14/2014  . Hip fracture (HCC) 08/14/2014  . Acute encephalopathy 08/14/2014  . Fall   . Left hemiparesis (HCC) 08/09/2014  . Diabetes mellitus type 2, controlled, without complications (HCC) 08/09/2014  . Essential hypertension 08/09/2014  . Acute respiratory failure with hypercapnia (HCC) 08/09/2014  . Altered mental status 08/08/2014    Orientation RESPIRATION BLADDER Height & Weight     Self, Time, Situation, Place  Normal Continent Weight: 86.2 kg (190 lb 0.6 oz) Height:   5\' 8"  (172.7 cm)  BEHAVIORAL SYMPTOMS/MOOD NEUROLOGICAL BOWEL NUTRITION STATUS    Convulsions/Seizures Continent Diet (Regular)  AMBULATORY STATUS COMMUNICATION OF NEEDS Skin   Supervision Verbally Normal                       Personal Care Assistance Level of Assistance  Bathing, Feeding, Dressing Bathing Assistance: Limited assistance Feeding assistance: Independent Dressing Assistance: Limited assistance     Functional Limitations Info             SPECIAL CARE FACTORS FREQUENCY  PT (By licensed PT)     PT Frequency: 5x/week              Contractures      Additional Factors Info  Code Status, Allergies Code Status Info: Full Allergies Info: NKA           Current Medications (06/09/2016):  This is the current hospital active medication list Current Facility-Administered Medications  Medication Dose Route Frequency Provider Last Rate Last Dose  . acetaminophen (TYLENOL) tablet 650 mg  650 mg Oral Q6H PRN Rolly Salter, MD      . acetaminophen-codeine (TYLENOL #3) 300-30 MG per tablet 1 tablet  1 tablet Oral Q8H PRN Rolly Salter, MD      . amLODipine (NORVASC) tablet 5 mg  5 mg Oral Daily Alyson Reedy, MD   5 mg at 06/09/16 0842  . amoxicillin-clavulanate (AUGMENTIN) 500-125 MG per tablet 500 mg  1 tablet Oral Q8H  Rolly SalterPranav M Patel, MD      . aspirin chewable tablet 81 mg  81 mg Oral Daily Roslynn AmbleJennings E Nestor, MD   81 mg at 06/09/16 [id number]  . atorvastatin (LIPITOR) tablet 20 mg  20 mg Oral q1800 Alyson ReedyWesam G Yacoub, MD   20 mg at 06/08/16 1800  . carvedilol (COREG) tablet 3.125 mg  3.125 mg Oral BID WC Alyson ReedyWesam G Yacoub, MD   3.125 mg at 06/09/16 0842  . clonazePAM (KLONOPIN) tablet 0.25 mg  0.25 mg Oral BID Alyson ReedyWesam G Yacoub, MD   0.25 mg at 06/09/16 0842  . cloNIDine (CATAPRES) tablet 0.1 mg  0.1 mg Oral BID Roslynn AmbleJennings E Nestor, MD   0.1 mg at 06/09/16 0841  . gabapentin (NEURONTIN) capsule 300 mg  300 mg Oral BID Renaee MundaKendra P Hiatt, RPH   300 mg at 06/09/16 0842  . heparin  injection 5,000 Units  5,000 Units Subcutaneous Q8H Rahul P Desai, PA-C   5,000 Units at 06/09/16 0554  . hydrALAZINE (APRESOLINE) injection 10-40 mg  10-40 mg Intravenous Q4H PRN Lupita Leashouglas B McQuaid, MD   20 mg at 06/08/16 1623  . insulin aspart (novoLOG) injection 0-20 Units  0-20 Units Subcutaneous Q4H Rahul P Desai, PA-C   3 Units at 06/09/16 0840  . lamoTRIgine (LAMICTAL) tablet 25 mg  25 mg Oral Daily Roslynn AmbleJennings E Nestor, MD   25 mg at 06/09/16 [id number]  . levETIRAcetam (KEPPRA) tablet 1,000 mg  1,000 mg Oral BID Roslynn AmbleJennings E Nestor, MD   1,000 mg at 06/09/16 0841  . lisinopril (PRINIVIL,ZESTRIL) tablet 40 mg  40 mg Oral Daily Alyson ReedyWesam G Yacoub, MD   40 mg at 06/09/16 0840  . methocarbamol (ROBAXIN) tablet 500 mg  500 mg Oral Q8H PRN Rolly SalterPranav M Patel, MD      . pantoprazole (PROTONIX) EC tablet 40 mg  40 mg Oral Daily Roslynn AmbleJennings E Nestor, MD   40 mg at 06/09/16 0840  . PARoxetine (PAXIL) tablet 10 mg  10 mg Oral Daily Alyson ReedyWesam G Yacoub, MD   10 mg at 06/09/16 0840  . QUEtiapine (SEROQUEL) tablet 25 mg  25 mg Oral QHS Alyson ReedyWesam G Yacoub, MD   25 mg at 06/08/16 2113     Discharge Medications: Please see discharge summary for a list of discharge medications.  Relevant Imaging Results:  Relevant Lab Results:   Additional Information SSN: [id number][id number]  Kebin Maye S Rim[place]Rayyan, [place]LCSWA

## 2016-06-09 NOTE — Care Management Important Message (Signed)
Important Message  Patient Details  Name: Terrance Cook MRN: [id number] Date of Birth: 10-17-1955   Medicare Important Message Given:  Yes    Kyla BalzarineShealy, Aharon Carriere Abena 06/09/2016, 1:29 PM

## 2016-06-09 NOTE — Progress Notes (Signed)
[facility] They will need to review patient before allowing him to return to his ALF, so patient is unable to discharge.    Osborne Cascoadia Brandye Inthavong [name] [phone]

## 2016-06-09 NOTE — Progress Notes (Addendum)
Physical Therapy Evaluation Patient Details Name: Terrance Cook MRN: [id number] DOB: [birth year] Today's Date: 06/09/2016   History of Present Illness  Patient presented to the hospital on 2/7 with acute encepholapthy and respiaratory failure. He has an old stroke effecting his left side. PMH : Tabacco use, seizure disorder, CAD, Schizoaffective disorder, DMIIl CVAx2, HTN, Cardiac cath,   Clinical Impression  Patient presents is likely near baseline mobility. He was able to stand and transfer with supervision. He would benefit from further rehabilitation at his SNF to improve overall mobility and ability to transfer. He was seen for a moderate complexity evaluation 2nd to multiple co-morbidities and multiple mobility deficits. He has no need for further skilled acute therapy.     Follow Up Recommendations SNF    Equipment Recommendations  None recommended by PT    Recommendations for Other Services       Precautions / Restrictions Restrictions Weight Bearing Restrictions: No      Mobility  Bed Mobility Overal bed mobility: Needs Assistance Bed Mobility: Sit to Supine;Supine to Sit     Supine to sit: Supervision Sit to supine: Supervision   General bed mobility comments: supervison to the edge of the bed.   Transfers Overall transfer level: Needs assistance Equipment used: Rolling walker (2 wheeled) Transfers: Sit to/from Stand Sit to Stand: Supervision         General transfer comment: supevision for balance. Difficulty weightbearing on L LE without his brace  Ambulation/Gait Ambulation/Gait assistance: Supervision Ambulation Distance (Feet): 6 Feet (3'x2) Assistive device: Rolling walker (2 wheeled) Gait Pattern/deviations: Decreased step length - right;Decreased stance time - left   Gait velocity interpretation: Below normal speed for age/gender General Gait Details: hyper extension on the left LE without the use of his brace   Stairs             Wheelchair Mobility    Modified Rankin (Stroke Patients Only) Modified Rankin (Stroke Patients Only) Pre-Morbid Rankin Score: Moderate disability Modified Rankin: Moderate disability     Balance                                             Pertinent Vitals/Pain Pain Assessment: No/denies pain    Home Living Family/patient expects to be discharged to:: Skilled nursing facility                 Additional Comments: Has been at a facility for a few years now.     Prior Function Level of Independence: Needs assistance   Gait / Transfers Assistance Needed: Minimal gait distance   ADL's / Homemaking Assistance Needed: Could transfer to commode with help         Hand Dominance   Dominant Hand: Right    Extremity/Trunk Assessment   Upper Extremity Assessment Upper Extremity Assessment: LUE deficits/detail LUE Deficits / Details: Baseline defcits but able to move his arm in all directions     Lower Extremity Assessment Lower Extremity Assessment: LLE deficits/detail LLE Deficits / Details: no active hamstring movement, 3/5 knee extension. Leg buckles in standing. Has brace to prevent hyperextension but does not have it at the hospital.        Communication   Communication: No difficulties  Cognition Arousal/Alertness: Awake/alert Behavior During Therapy: WFL for tasks assessed/performed Overall Cognitive Status: Within Functional Limits for tasks assessed  General Comments      Exercises     Assessment/Plan    PT Assessment All further PT needs can be met in the next venue of care  PT Problem List Decreased strength;Decreased range of motion;Decreased activity tolerance;Decreased balance;Decreased mobility;Decreased knowledge of use of DME;Decreased safety awareness          PT Treatment Interventions      PT Goals (Current goals can be found in the Care Plan section)  Acute Rehab PT Goals Patient  Stated Goal: to go back to SNF  PT Goal Formulation: With patient Time For Goal Achievement: 06/16/16 Potential to Achieve Goals: Good    Frequency     Barriers to discharge        Co-evaluation               End of Session Equipment Utilized During Treatment: Gait belt Activity Tolerance: Patient tolerated treatment well Patient left: in bed;with call bell/phone within reach;with bed alarm set Nurse Communication: Mobility status         Time: 1000-1021 PT Time Calculation (min) (ACUTE ONLY): 21 min   Charges:   PT Evaluation $PT Eval Moderate Complexity: 1 Procedure     PT G Codes:        Carney Living PT DPT  06/09/2016, 11:17 AM

## 2016-06-09 NOTE — Discharge Summary (Addendum)
[facility] Discharge Summary   Patient: Terrance Cook ZOX:[id number]   PCP: No PCP Per Patient DOB: 02-13-1956   Date of admission: 06/03/2016   Date of discharge:  06/09/2016    Discharge Diagnoses:  Principal Problem:   Acute encephalopathy Active Problems:   Diabetes mellitus type 2, controlled, without complications (HCC)   MDD (major depressive disorder)   Schizoaffective disorder (HCC)   H/O: CVA (cerebrovascular accident)   Slurred speech   Leg edema, left   Encounter for orogastric (OG) tube placement   Acute on chronic respiratory failure with hypoxemia (HCC)   Encounter for intubation   Narcotic overdose  Admitted From: Assisted living facility Disposition:  Assisted living facility with home health(patient refused SNF)  Recommendations for Outpatient Follow-up:  1. Follow-up with PCP in one week   Follow-up Information    PCP. Schedule an appointment as soon as possible for a visit in 1 week(s).          Diet recommendation: Cardiac diet  Activity: The patient is advised to gradually reintroduce usual activities.  Discharge Condition: Good  Code Status: Full code  History of present illness: As per the H and P dictated on admission, "Pt is encephelopathic; therefore, this HPI is obtained from chart review. Terrance Cook is a 61 y.o. male with PMH as outlined below. 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  He was apparently playing cards and then went back to his room when done.  He was then found in his room with slurred speech.  EMS was called and upon their arrival, he was altered and had left leg weakness (though this is reportedly old from prior stroke). He was given 1.4mg  narcan and initially responded but then became altered again.  He received a second dose of narcan in the ED but had no response.  In ED, his mental status worsened and he had  sonorous respirations with questionable airway protection.  He was subsequently intubated for airway protection.  He apparently had projectile vomiting during intubation. 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  Of note he also has a hx of seizure disorder and is on keppra and lamictal but no known recent seizures.  Family informed neuro that pt has been known to take a lot of opiates and in the past, he has had slurred speech from this.  PCCM was called for admission."  Hospital Course:  Summary of his active problems in the hospital is as following. Acute encephalopathy Narcotic overdose   initially seen in the ER, received Narcan with improvement, later on become agitated requiring intubation for airway protection. Patient was admitted to ICU under PCCM. Self extubated on 06/04/2016. Was given steroids for vocal cord edema in ICU. Neurology was consulted, who felt his presentation is secondary to opioid overdose. 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 Occasional agitation while on Keppra, neurology feels this kidney addressed as an outpatient. Recommend patient to follow-up with PCP as well as neurology in one and one month respectively to address this issues. Given that the patient presented with opioid overdose I would recommend to discontinue his opioids at the time of the discharge. Would recommend to control pain with Tylenol and Robaxin. New prescriptions provided.  Active Problems:   Acute on chronic respiratory failure with hypoxemia (HCC)     Aspiration pneumonia. Patient had projectile vomiting at the time of intubation. He was started on Unasyn in the ICU. Cultures are so far negative. No evidence of ongoing active pneumonia. Will finish 7 day treatment course with Augmentin.    MDD (major depressive disorder)   Schizoaffective disorder  (HCC) Continuing home regimen.    H/O: CVA (cerebrovascular accident) Residual left leg weakness with slurred speech. Slurred speech has resolved. Continue home regimen. Continue 81 mg aspirin and Lipitor.    Leg edema, left Ruled out for DVT. Close monitoring.   Essential hypertension. Blood pressure well controlled. Clonidine dose has been reduced to 0.1 mg twice a day for CCM, I will switch his patch from 0.2 mg 0.1 mg. Presuming all other home medications.  All other chronic medical condition were stable during the hospitalization.  Patient was seen by physical therapy, who recommended SNF, patient refused, home health was arranged by Child psychotherapist and case Production designer, theatre/television/film. On the day of the discharge the patient's vitals were stable, and no other acute medical condition were reported by patient. the patient was felt safe to be discharge aALF with home health.  Procedures and Results:  EEG  Intubation  Lower extremity Doppler  Consultations:  Neurology  Primary admission with CCM  DISCHARGE MEDICATION: Current Discharge Medication List    START taking these medications   Details  amoxicillin-clavulanate (AUGMENTIN) 500-125 MG tablet Take 1 tablet (500 mg total) by mouth every 8 (eight) hours. Qty: 6 tablet, Refills: 0    methocarbamol (ROBAXIN) 500 MG tablet Take 1 tablet (500 mg total) by mouth every 8 (eight) hours as needed for muscle spasms. Qty: 15 tablet, Refills: 0      CONTINUE these medications which have CHANGED   Details  cloNIDine (CATAPRES - DOSED IN MG/24 HR) 0.1 mg/24hr patch Place 1 patch (0.1 mg total) onto the skin once a week. Qty: 3 patch, Refills: 0      CONTINUE these medications which have NOT CHANGED   Details  amLODipine (NORVASC) 10 MG tablet Take 5 mg by mouth daily.     aspirin 81 MG chewable tablet Chew 81 mg by mouth daily.    atorvastatin (LIPITOR) 20 MG tablet Take 20 mg by mouth daily at 6 PM.     carvedilol (COREG) 3.125 MG  tablet Take 1 tablet (3.125 mg total) by mouth 2 (two) times daily with a meal. Qty: 60 tablet, Refills: 1    clonazePAM (KLONOPIN) 0.5 MG tablet Take 0.25 mg by mouth 2 (two) times daily as needed for anxiety.    diclofenac sodium (VOLTAREN) 1 % GEL Apply 2 g topically 2 (two) times daily. Apply to lower back, left hip, and left knee    febuxostat (ULORIC) 40 MG tablet Take 40 mg by mouth daily.    furosemide (LASIX) 20 MG tablet Take 40 mg by mouth daily.    gabapentin (NEURONTIN) 300 MG capsule Take 300 mg by mouth 2 (two) times daily.    ibuprofen (ADVIL,MOTRIN) 200 MG tablet Take 200 mg by mouth 2 (two) times daily.    !! lamoTRIgine (LAMICTAL) 100 MG tablet Take 50 mg by mouth every evening.    !! lamoTRIgine (LAMICTAL) 25 MG tablet Take 25 mg by mouth every morning.     levETIRAcetam (KEPPRA) 1000 MG tablet Take 1,000 mg by mouth 2 (two) times daily.    lisinopril (PRINIVIL,ZESTRIL) 20 MG tablet  Take 40 mg by mouth daily.    Melatonin 1 MG TABS Take 1 mg by mouth at bedtime.    nitroGLYCERIN (NITROSTAT) 0.4 MG SL tablet Place 0.4 mg under the tongue every 5 (five) minutes as needed for chest pain.    pantoprazole (PROTONIX) 40 MG tablet Take 1 tablet (40 mg total) by mouth daily at 6 (six) AM. Qty: 30 tablet, Refills: 1    PARoxetine (PAXIL) 10 MG tablet Take 10 mg by mouth daily.    QUEtiapine (SEROQUEL) 25 MG tablet Take 25 mg by mouth at bedtime.    senna (SENOKOT) 8.6 MG TABS tablet Take 2 tablets by mouth daily.    tamsulosin (FLOMAX) 0.4 MG CAPS capsule Take 0.4 mg by mouth daily.    bisacodyl (DULCOLAX) 5 MG EC tablet Take 1 tablet (5 mg total) by mouth daily as needed for moderate constipation. Qty: 30 tablet, Refills: 0    nicotine (NICODERM CQ - DOSED IN MG/24 HOURS) 21 mg/24hr patch Place 1 patch (21 mg total) onto the skin daily. Qty: 28 patch, Refills: 0     !! - Potential duplicate medications found. Please discuss with provider.    STOP taking these  medications     oxyCODONE (OXY IR/ROXICODONE) 5 MG immediate release tablet      oxyCODONE-acetaminophen (PERCOCET) 5-325 MG tablet        No Known Allergies Discharge Instructions    Diet - low sodium heart healthy    Complete by:  As directed    Discharge instructions    Complete by:  As directed    It is important that you read following instructions as well as go over your medication list with RN to help you understand your care after this hospitalization.  Discharge Instructions: Please follow-up with PCP in one week  Please request your primary care physician to go over all Hospital Tests and Procedure/Radiological results at the follow up,  Please get all Hospital records sent to your PCP by signing hospital release before you go home.   Do not drive, operating heavy machinery, perform activities at heights, swimming or participation in water activities or provide baby sitting services while your are on Pain, Sleep and Anxiety Medications; until you have been seen by Primary Care Physician or a Neurologist and advised to do so again. Do not take more than prescribed Pain, Sleep and Anxiety Medications. You were cared for by a hospitalist during your hospital stay. If you have any questions about your discharge medications or the care you received while you were in the hospital after you are discharged, you can call the unit and ask to speak with the hospitalist on call if the hospitalist that took care of you is not available.  Once you are discharged, your primary care physician will handle any further medical issues. Please note that NO REFILLS for any discharge medications will be authorized once you are discharged, as it is imperative that you return to your primary care physician (or establish a relationship with a primary care physician if you do not have one) for your aftercare needs so that they can reassess your need for medications and monitor your lab values. You Must  read complete instructions/literature along with all the possible adverse reactions/side effects for all the Medicines you take and that have been prescribed to you. Take any new Medicines after you have completely understood and accept all the possible adverse reactions/side effects. Wear Seat belts while driving. If you have smoked  or chewed Tobacco in the last 2 yrs please stop smoking and/or stop any Recreational drug use.   Increase activity slowly    Complete by:  As directed      Discharge Exam: Filed Weights   06/07/16 0500 06/08/16 0500 06/09/16 0500  Weight: 92.3 kg (203 lb 7.8 oz) 86.7 kg (191 lb 2.2 oz) 86.2 kg (190 lb 0.6 oz)   Vitals:   06/09/16 0438 06/09/16 0839  BP: 132/74 139/72  Pulse: 85 86  Resp: 18   Temp: 98.3 F (36.8 C)    General: Appear in no distress, no Rash; Oral Mucosa moist. Cardiovascular: S1 and S2 Present, no Murmur, no JVD Respiratory: Bilateral Air entry present and Clear to Auscultation, no Crackles, no wheezes Abdomen: Bowel Sound present, Soft and no tenderness Extremities: no Pedal edema, no calf tenderness Neurology: Grossly no focal neuro deficit.  The results of significant diagnostics from this hospitalization (including imaging, microbiology, ancillary and laboratory) are listed below for reference.    Significant Diagnostic Studies: Ct Angio Head W Or Wo Contrast  Result Date: 06/04/2016 CLINICAL DATA:  Slurred speech. EXAM: CT ANGIOGRAPHY HEAD AND NECK TECHNIQUE: Multidetector CT imaging of the head and neck was performed using the standard protocol during bolus administration of intravenous contrast. Multiplanar CT image reconstructions and MIPs were obtained to evaluate the vascular anatomy. Carotid stenosis measurements (when applicable) are obtained utilizing NASCET criteria, using the distal internal carotid diameter as the denominator. CONTRAST:  50 cc Isovue 370 COMPARISON:  06/04/2016 CT head. 08/08/2014 CT angiogram head and  neck. FINDINGS: CTA NECK FINDINGS Aortic arch: Calcific atherosclerosis. Bovine arch, normal variant. No aneurysm or dissection identified. Right carotid system: No evidence of dissection, stenosis (50% or greater) or occlusion. Calcified plaque of the carotid bifurcation with mild less than 50% stenosis. Left carotid system: No evidence of dissection, stenosis (50% or greater) or occlusion. Calcified plaque of the carotid bifurcation with mild less than 50% stenosis. Vertebral arteries: Left dominant. No evidence of dissection, stenosis (50% or greater) or occlusion. Skeleton: Straightening of cervical lordosis from C2 through C5 and exaggerated lordosis from C5 through C7. Mild discogenic and facet degenerative changes. No high-grade bony canal stenosis. Other neck: Endotracheal tube tip approximately 1.6 cm from the carina. Enteric tube tip extends below the field of view into the abdomen. Debris within the nasal and oropharynx is likely due to intubation. Upper chest: Smooth septal thickening probably represents interstitial pulmonary edema. Small bilateral pleural effusions. Review of the MIP images confirms the above findings CTA HEAD FINDINGS Anterior circulation: .Calcific atherosclerosis of cavernous and paraclinoid internal carotid arteries bilaterally without significant stenosis. Patent bilateral M1 and distal MCA circulation. Patent bilateral A1 and A2 segments with diminutive caliber of distal right ACA circulation likely related to chronic infarct. Posterior circulation: Left V4 multiple foci of calcified plaque with mild stenosis. Basilar irregularity with mild mid segment stenosis compatible with atherosclerosis. Multiple segments of mild-to-moderate stenosis of bilateral P1 and P2 segments. Venous sinuses: As permitted by contrast timing, patent. Anatomic variants: Diminutive right posterior communicating artery. No left posterior communicating artery or anterior communicating artery identified,  likely hypoplastic or absent. Review of the MIP images confirms the above findings IMPRESSION: 1. Patent carotid and vertebral arteries of the neck. 2. Stable calcified plaque of the carotid bifurcations with mild less than 50% stenosis. 3. Patent circle of Willis with no high-grade stenosis, proximal occlusion, aneurysm, or vascular malformation identified 4. Intracranial atherosclerosis with multiple areas of mild-to-moderate stenosis most  pronounced in the bilateral posterior cerebral arteries. 5. Diminutive right distal ACA circulation likely related to prior infarction. These results were called by telephone at the time of interpretation on 06/04/2016 at 12:46 am to Dr. Manus Gunning who verbally acknowledged these results. Electronically Signed   By: Mitzi Hansen M.D.   On: 06/04/2016 00:58   Ct Angio Neck W Or Wo Contrast  Result Date: 06/04/2016 CLINICAL DATA:  Slurred speech. EXAM: CT ANGIOGRAPHY HEAD AND NECK TECHNIQUE: Multidetector CT imaging of the head and neck was performed using the standard protocol during bolus administration of intravenous contrast. Multiplanar CT image reconstructions and MIPs were obtained to evaluate the vascular anatomy. Carotid stenosis measurements (when applicable) are obtained utilizing NASCET criteria, using the distal internal carotid diameter as the denominator. CONTRAST:  50 cc Isovue 370 COMPARISON:  06/04/2016 CT head. 08/08/2014 CT angiogram head and neck. FINDINGS: CTA NECK FINDINGS Aortic arch: Calcific atherosclerosis. Bovine arch, normal variant. No aneurysm or dissection identified. Right carotid system: No evidence of dissection, stenosis (50% or greater) or occlusion. Calcified plaque of the carotid bifurcation with mild less than 50% stenosis. Left carotid system: No evidence of dissection, stenosis (50% or greater) or occlusion. Calcified plaque of the carotid bifurcation with mild less than 50% stenosis. Vertebral arteries: Left dominant. No  evidence of dissection, stenosis (50% or greater) or occlusion. Skeleton: Straightening of cervical lordosis from C2 through C5 and exaggerated lordosis from C5 through C7. Mild discogenic and facet degenerative changes. No high-grade bony canal stenosis. Other neck: Endotracheal tube tip approximately 1.6 cm from the carina. Enteric tube tip extends below the field of view into the abdomen. Debris within the nasal and oropharynx is likely due to intubation. Upper chest: Smooth septal thickening probably represents interstitial pulmonary edema. Small bilateral pleural effusions. Review of the MIP images confirms the above findings CTA HEAD FINDINGS Anterior circulation: .Calcific atherosclerosis of cavernous and paraclinoid internal carotid arteries bilaterally without significant stenosis. Patent bilateral M1 and distal MCA circulation. Patent bilateral A1 and A2 segments with diminutive caliber of distal right ACA circulation likely related to chronic infarct. Posterior circulation: Left V4 multiple foci of calcified plaque with mild stenosis. Basilar irregularity with mild mid segment stenosis compatible with atherosclerosis. Multiple segments of mild-to-moderate stenosis of bilateral P1 and P2 segments. Venous sinuses: As permitted by contrast timing, patent. Anatomic variants: Diminutive right posterior communicating artery. No left posterior communicating artery or anterior communicating artery identified, likely hypoplastic or absent. Review of the MIP images confirms the above findings IMPRESSION: 1. Patent carotid and vertebral arteries of the neck. 2. Stable calcified plaque of the carotid bifurcations with mild less than 50% stenosis. 3. Patent circle of Willis with no high-grade stenosis, proximal occlusion, aneurysm, or vascular malformation identified 4. Intracranial atherosclerosis with multiple areas of mild-to-moderate stenosis most pronounced in the bilateral posterior cerebral arteries. 5.  Diminutive right distal ACA circulation likely related to prior infarction. These results were called by telephone at the time of interpretation on 06/04/2016 at 12:46 am to Dr. Manus Gunning who verbally acknowledged these results. Electronically Signed   By: Mitzi Hansen M.D.   On: 06/04/2016 00:58   Mr Brain Wo Contrast  Result Date: 06/05/2016 CLINICAL DATA:  Slurred speech. Prior stroke with residual left lower extremity weakness. EXAM: MRI HEAD WITHOUT CONTRAST TECHNIQUE: Multiplanar, multiecho pulse sequences of the brain and surrounding structures were obtained without intravenous contrast. COMPARISON:  Head CT/ CTA 12/22/2016 and MRI 08/09/2014 FINDINGS: Brain: There is no evidence of acute infarct,  intracranial hemorrhage, mass, midline shift, or extra-axial fluid collection. A chronic right ACA infarct is again noted. Small foci of T2 hyperintensity elsewhere in the cerebral white matter bilaterally are also unchanged from 2016 and nonspecific but compatible with mild chronic small vessel ischemic disease. There is mild ex vacuo enlargement of the right lateral ventricle. Vascular: Major intracranial vascular flow voids are preserved. Skull and upper cervical spine: Unremarkable bone marrow signal. Sinuses/Orbits: Unremarkable orbits.  No significant sinus disease. Other: None. IMPRESSION: 1. No acute intracranial abnormality. 2. Chronic right ACA infarct and mild chronic small vessel ischemia. Electronically Signed   By: Sebastian Ache M.D.   On: 06/05/2016 13:14   Dg Chest Port 1 View  Result Date: 06/08/2016 CLINICAL DATA:  Respiratory failure, short of breath EXAM: PORTABLE CHEST 1 VIEW COMPARISON:  06/05/2016 FINDINGS: Endotracheal tube and NG tube removed. Improved aeration in the lung bases. Mild residual airspace disease in the right lung base. IMPRESSION: Endotracheal tube and NG tube removed. Improved airspace disease in the bases. Mild residual atelectasis or infiltrate in the right  lung base. Electronically Signed   By: Marlan Palau M.D.   On: 06/08/2016 06:39   Dg Chest Port 1 View  Result Date: 06/05/2016 CLINICAL DATA:  61 year old male with lethargy and decreasing mental status. Intubated. Initial encounter. EXAM: PORTABLE CHEST 1 VIEW COMPARISON:  06/04/2016 and earlier. FINDINGS: Portable AP semi upright view at 0507 hours. The patient is rotated to the right. Endotracheal tube tip in good position. Enteric tube courses to the abdomen, tip not included. Stable cardiac size and mediastinal contours. Mildly increased streaky opacity at both lung bases. No pneumothorax, pulmonary edema or pleural effusion. IMPRESSION: 1.  Stable lines and tubes. 2. Rotated to the right. Mildly increased streaky opacity at the lung bases which most resembles atelectasis. Electronically Signed   By: Odessa Fleming M.D.   On: 06/05/2016 07:54   Dg Chest Port 1 View  Result Date: 06/04/2016 CLINICAL DATA:  ETT placement today. EXAM: PORTABLE CHEST 1 VIEW COMPARISON:  Single-view of the chest 06/04/2016. FINDINGS: Endotracheal tube is in place with the tip just below the clavicular heads, 2.6 cm above the carina. NG tube courses into the stomach and below the inferior margin of film. Mild discoid atelectasis in the lower lung zones bilaterally is identified. No pneumothorax or pleural effusion. Heart size is normal. Aortic atherosclerosis noted IMPRESSION: ETT and NG tube projecting good position. Mild discoid atelectasis bilaterally. Electronically Signed   By: Drusilla Kanner M.D.   On: 06/04/2016 09:28   Dg Chest Port 1 View  Result Date: 06/04/2016 CLINICAL DATA:  Respiratory failure EXAM: PORTABLE CHEST 1 VIEW COMPARISON:  June 03, 2016 FINDINGS: Endotracheal tube has been removed. No pneumothorax. There is atelectatic change in the left base. There is mild interstitial edema, but there is no airspace consolidation. Heart is upper normal in size with pulmonary venous hypertension no adenopathy.  There is an old healed fracture of the posterior left fifth rib. IMPRESSION: There is a degree of interstitial pulmonary edema with pulmonary venous hypertension. Suspect a degree of congestive heart failure. There is left base atelectasis. No airspace consolidation. No evident adenopathy. Electronically Signed   By: Bretta Bang III M.D.   On: 06/04/2016 08:31   Dg Chest Portable 1 View  Result Date: 06/04/2016 CLINICAL DATA:  Intubation.  Unresponsive. EXAM: PORTABLE CHEST 1 VIEW COMPARISON:  06/03/2015 FINDINGS: The endotracheal tube is been placed with tip measuring 3 cm above the carina.  Shallow inspiration with atelectasis in the lung bases. Increased opacity in the left perihilar region may be due to vascular crowding because of shallow inspiration but early infiltration or edema is not excluded. No blunting of costophrenic angles. No pneumothorax. Tortuous aorta. IMPRESSION: Endotracheal tube appears in satisfactory position. Shallow inspiration with atelectasis in the lung bases. Increased left perihilar density may be due to shallow inspiration or may indicate early infiltrate/edema. Electronically Signed   By: Burman Nieves M.D.   On: 06/04/2016 00:18   Dg Abd Portable 1v  Result Date: 06/04/2016 CLINICAL DATA:  Check gastric catheter placement EXAM: PORTABLE ABDOMEN - 1 VIEW COMPARISON:  06/04/2016 FINDINGS: Gastric catheter is again noted within the stomach stable from the prior exam. Scattered large and small bowel gas is noted. No bony abnormality is seen. IMPRESSION: Gastric catheter within the stomach. Electronically Signed   By: Alcide Clever M.D.   On: 06/04/2016 09:29   Dg Abd Portable 1v  Result Date: 06/04/2016 CLINICAL DATA:  Check gastric catheter placement EXAM: PORTABLE ABDOMEN - 1 VIEW COMPARISON:  None. FINDINGS: Gastric catheter is noted within the stomach directed towards the pylorus. Scattered large and small bowel gas is noted. Postsurgical changes in the left hip are  noted. No acute abnormality is seen. IMPRESSION: Gastric catheter as described. Electronically Signed   By: Alcide Clever M.D.   On: 06/04/2016 07:31   Ct Head Code Stroke W/o Cm  Result Date: 06/04/2016 CLINICAL DATA:  Code stroke. EXAM: CT HEAD WITHOUT CONTRAST TECHNIQUE: Contiguous axial images were obtained from the base of the skull through the vertex without intravenous contrast. COMPARISON:  08/08/2014 CT head FINDINGS: Brain: Stable chronic right ACA distribution encephalomalacia from prior infarction. Stable chronic microvascular ischemic changes of white matter. No large acute infarct, focal mass effect, hydrocephalus, or intracranial hemorrhage identified. Vascular: Mild calcific atherosclerosis of cavernous internal carotid arteries bilaterally. Skull: No displaced calvarial fracture. Sinuses/Orbits: Mucosal thickening within ethmoid sinus is probably due to intubation. Orbits are unremarkable. Other: None. ASPECTS [address] [facility] Stroke Program Early CT Score) - Ganglionic level infarction (caudate, lentiform nuclei, internal capsule, insula, M1-M3 cortex): 7 - Supraganglionic infarction (M4-M6 cortex): 3 Total score (0-10 with 10 being normal): 10 IMPRESSION: 1. No acute intracranial abnormality identified. 2. ASPECTS is 10 3. 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 Electronically Signed   By: Mitzi Hansen M.D.   On: 06/04/2016 00:27    Microbiology: Recent Results (from the past 240 hour(s))  MRSA PCR Screening     Status: None   Collection Time: 06/04/16  2:45 AM  Result Value Ref Range Status   MRSA by PCR NEGATIVE NEGATIVE Final    Comment:        The GeneXpert MRSA Assay (FDA approved for NASAL specimens only), is one component of a comprehensive MRSA colonization surveillance program. It is not intended to diagnose MRSA infection nor to guide or monitor treatment for MRSA infections.      Labs: CBC:  Recent  Labs Lab 06/03/16 2340 06/04/16 0301 06/05/16 0216 06/06/16 0306 06/08/16 0141 06/09/16 0543  WBC 5.0 4.9 2.6* 5.5 7.3 5.9  NEUTROABS 1.9  --  2.2 4.3  --   --   HGB 12.4* 11.4* 10.3* 11.3* 11.7* 12.3*  HCT 38.7* 37.6* 34.1* 36.7* 36.8* 38.9*  MCV 82.5 83.0 83.4 84.8 80.9 80.4  PLT 146* 155 177 188 148* 164   Basic Metabolic Panel:  Recent Labs Lab 06/05/16 0216 06/05/16 1610  06/05/16 1621 06/06/16 0306 06/06/16 1203 06/06/16 1845 06/07/16 1612 06/08/16 0141 06/09/16 0543  NA 146*  --   --   --  149*  --  145 143 142  K 3.9  --   --   --  4.0  --  3.1* 3.2* 3.0*  CL 115*  --   --   --  116*  --  105 105 107  CO2 23  --   --   --  26  --  29 30 24   GLUCOSE 142*  --   --   --  81  --  77 95 123*  BUN 10  --   --   --  25*  --  13 10 14   CREATININE 0.61  --   --   --  0.68  --  0.56* 0.58* 0.72  CALCIUM 9.1  --   --   --  9.1  --  8.9 8.6* 9.2  MG  --  1.9 1.9 2.0  --  2.0  --  1.9 2.0  PHOS  --  4.2 4.8* 4.1  --  4.0  --   --  4.9*   Liver Function Tests:  Recent Labs Lab 06/03/16 2347  AST 30  ALT 17  ALKPHOS 37*  BILITOT 0.4  PROT 6.9  ALBUMIN 4.3   CBG:  Recent Labs Lab 06/08/16 2033 06/09/16 0058 06/09/16 0435 06/09/16 0811 06/09/16 1205  GLUCAP 99 88 95 125* 75   Time spent: 30 minutes  Signed:  PATEL, PRANAV  [facility]  06/09/2016  , 12:43 PM   Ok to discharge to SNF from my standpoint. Pt stable and ready for discharge. Reports no new concerns today. VSS  Addendum: Of note initial suspicion was patient overdosed on opioids. It is worth mentioning that urine drug screen was negative for opioids initially. At least for the opioids we tested for. At this point I have no evidence of patient use any other opioids and he adamantly denies overdosing on any medications.  Alyannah Sanks   Pt stable for discharge.   Maly Lemarr, [employer]

## 2016-06-09 NOTE — Care Management Important Message (Signed)
Important Message  Patient Details  Name: Terrance Cook MRN: [id number] Date of Birth: 1955/05/03   Medicare Important Message Given:  Yes    Kyla BalzarineShealy, Jashad Depaula Abena 06/09/2016, 1:31 PM

## 2016-06-09 NOTE — Progress Notes (Signed)
Pt transferred to 5 west. Alert and Oriented x 4, no signs of acute distress, VS stable.  Pt oriented to room, cardiac monitor and continuous pulse oxymetry placed. Pt advised about valuable policy and instructed to call for assistance.

## 2016-06-09 NOTE — Progress Notes (Signed)
eLink Physician-Brief Progress Note Patient Name: Terrance HalimJames Cook DOB: 1955/09/07 MRN: [id number]   Date of Service  06/09/2016  HPI/Events of Note    eICU Interventions  Single percocet given for back pain, avoiding oversedation     Intervention Category Intermediate Interventions: Pain - evaluation and management  Phu Record S. 06/09/2016, 6:18 AM

## 2016-06-09 NOTE — Clinical Social Work Note (Signed)
Clinical Social Work Assessment  Patient Details  Name: Terrance HalimJames Terrance Cook MRN: [id number] Date of Birth: 1955/12/20  Date of referral:  06/09/16               Reason for consult:  Discharge Planning                Permission sought to share information with:  Oceanographeracility Contact Representative Permission granted to share information::  Yes, Verbal Permission Granted  Name::        Agency::  [facility]  Relationship::     Contact Information:     Housing/Transportation Living arrangements for the past 2 months:  Assisted Living Facility Source of Information:  Patient Patient Interpreter Needed:  None Criminal Activity/Legal Involvement Pertinent to Current Situation/Hospitalization:  No - Comment as needed Significant Relationships:  None Lives with:  Facility Resident, Self Do you feel safe going back to the place where you live?  Yes Need for family participation in patient care:  No (Coment)  Care giving concerns:  CSW received consult regarding discharge needs. CSW spoke with patient regarding recommendation for SNF. Patient reported that he would rather return to [facility]. CSW to continue to follow and assist with discharge planning needs.   Social Worker assessment / plan:  CSW spoke with patient [facility]. Patient stated he wanted to return to ALF.  Employment status:  Retired Health and safety inspectornsurance information:  Armed forces operational officerMedicare, [place] Recommendations:  Skilled Nursing Facility Information / Referral to community resources:     Patient/Family's Response to care:  Patient reports feeling good and being ready to return to his ALF. CSW explained that there is a Scientist, physiologicalASRR barrier. Patient reports understanding.   Patient/Family's Understanding of and Emotional Response to Diagnosis, Current Treatment, and Prognosis:  Patient expressed understanding of CSW role and discharge process. No questions/concerns about plan or treatment.     Emotional Assessment Appearance:  Appears stated age Attitude/Demeanor/Rapport:  Other (Appropriate) Affect (typically observed):  Accepting, Appropriate Orientation:  Oriented to Self, Oriented to Situation, Oriented to Place, Oriented to  Time Alcohol / Substance use:  Not Applicable Psych involvement (Current and /or in the community):  No (Comment)  Discharge Needs  Concerns to be addressed:  Care Coordination Readmission within the last 30 days:  No Current discharge risk:  None Barriers to Discharge:  No Barriers Identified   Mearl Latinadia S Deaire Mcwhirter, [name] 06/09/2016, 4:26 PM

## 2016-06-10 LAB — BASIC METABOLIC PANEL
ANION GAP: 13 (ref 5–15)
BUN: 13 mg/dL (ref 6–20)
CALCIUM: 9.2 mg/dL (ref 8.9–10.3)
CO2: 22 mmol/L (ref 22–32)
CREATININE: 0.72 mg/dL (ref 0.61–1.24)
Chloride: 108 mmol/L (ref 101–111)
Glucose, Bld: 83 mg/dL (ref 65–99)
Potassium: 3.7 mmol/L (ref 3.5–5.1)
SODIUM: 143 mmol/L (ref 135–145)

## 2016-06-10 LAB — CBC
HEMATOCRIT: 40.5 % (ref 39.0–52.0)
Hemoglobin: 12.6 g/dL — ABNORMAL LOW (ref 13.0–17.0)
MCH: 25.3 pg — ABNORMAL LOW (ref 26.0–34.0)
MCHC: 31.1 g/dL (ref 30.0–36.0)
MCV: 81.3 fL (ref 78.0–100.0)
PLATELETS: 131 10*3/uL — AB (ref 150–400)
RBC: 4.98 MIL/uL (ref 4.22–5.81)
RDW: 15.4 % (ref 11.5–15.5)
WBC: 6.1 10*3/uL (ref 4.0–10.5)

## 2016-06-10 LAB — GLUCOSE, CAPILLARY
GLUCOSE-CAPILLARY: 91 mg/dL (ref 65–99)
GLUCOSE-CAPILLARY: 90 mg/dL (ref 65–99)
GLUCOSE-CAPILLARY: 118 mg/dL — AB (ref 65–99)
GLUCOSE-CAPILLARY: 102 mg/dL — AB (ref 65–99)
GLUCOSE-CAPILLARY: 132 mg/dL — AB (ref 65–99)

## 2016-06-10 LAB — THC,MS,WB/SP RFX
CANNABINOID CONFIRMATION: POSITIVE
CARBOXY-THC: 20.5 ng/mL
Cannabidiol: NEGATIVE ng/mL
Cannabinol: NEGATIVE ng/mL
Hydroxy-THC: 1.9 ng/mL
TETRAHYDROCANNABINOL(THC): 2.5 ng/mL

## 2016-06-10 MED ORDER — IBUPROFEN 600 MG PO TABS
600.0000 mg | ORAL_TABLET | Freq: Four times a day (QID) | ORAL | Status: DC | PRN
  Administered 2016-06-10 – 2016-06-11 (×3): 600 mg via ORAL
  Filled 2016-06-10 (×3): qty 1

## 2016-06-10 NOTE — Progress Notes (Signed)
Patient threatening to leave AMA if he does not receive more pain medication for chronic back pain.Patient has received prn doses of tylenol #3 and robaxin with no relief per patient.Text paged Maren ReamerKaren Kirby NP awaiting response.

## 2016-06-10 NOTE — Progress Notes (Signed)
Pasrr evaluator coming to assess patient after 3:30pm today.  Terrance Cook [name] [phone]

## 2016-06-10 NOTE — Care Management Note (Signed)
Case Management Note  Patient Details  Name: Jacinto HalimJames Lapoint MRN: [id number] Date of Birth: 06/02/1955  Subjective/Objective:       Presented with acute encephalopathy and respiratory failure.  PMH : Tabacco use, seizure disorder, CAD, Schizoaffective disorder, DMIIl CVAx2, HTN, Cardiac cath. From ALF.             Action/Plan: Pasrr pending.... CSW managing disposition back to [facility] [place]/ [facility]F.  Expected Discharge Date:  06/09/16               Expected Discharge Plan:  Assisted Living / Rest Home  In-House Referral:  Clinical Social Work  Discharge planning Services  CM Consult Status of Service:  Completed, signed off  If discussed at [place] of [place] [employer] Meetings, dates discussed:    Additional Comments:  Epifanio LeschesCole, Quientin Jent Hudson, RN 06/10/2016, 9:49 AM

## 2016-06-10 NOTE — Plan of Care (Signed)
RN, Thurston HoleAnne, paged this NP because pt was threatening to leave AMA if he wasn't given further narcotics over what is ordered. RN has given the ordered Tylenol #3 and Robaxin. NP reviewed chart. Pt came in with altered mental status and slurred speech. Stroke workup was negative and sx attributed to narcotic overdose. Per attending note, pt will not receive narcotics on discharge for his chronic back pain. NP agreed to one additional dose of Robaxin 250mg  and a Lidocaine patch for back pain. Explained that no further narcotics will be ordered.  KJKG, NP [facility]

## 2016-06-10 NOTE — Evaluation (Signed)
Occupational Therapy Evaluation and Discharge Patient Details Name: Terrance Cook MRN: [id number] DOB: 1955/07/01 Today's Date: 06/10/2016    History of Present Illness Patient presented to the hospital on 2/7 with acute encepholapthy and respiaratory failure. He has an old stroke effecting his left side. PMH : Tabacco use, seizure disorder, CAD, Schizoaffective disorder, DMIIl CVAx2, HTN, Cardiac cath,    Clinical Impression   Pt reports he was modified independent with BADL and basic transfers PTA. Currently pt overall supervision for ADL and functional mobility. Feel pt is at or close to his functional baseline currently. Pt planning to return to ALF upon d/c. No further acute OT needs identified; signing off at this time. Please re-consult if needs change. Thank you for this referral.    Follow Up Recommendations  No OT follow up;Supervision/Assistance - 24 hour    Equipment Recommendations  None recommended by OT    Recommendations for Other Services       Precautions / Restrictions Precautions Precautions: Fall Restrictions Weight Bearing Restrictions: No      Mobility Bed Mobility Overal bed mobility: Modified Independent             General bed mobility comments: HOB elevated. No physical assist  Transfers Overall transfer level: Needs assistance Equipment used: None Transfers: Stand Pivot Transfers   Stand pivot transfers: Supervision       General transfer comment: Supervision for safety. No physical assist required. Transferring to L side    Balance Overall balance assessment: Needs assistance Sitting-balance support: Feet supported;No upper extremity supported Sitting balance-Leahy Scale: Good     Standing balance support: Bilateral upper extremity supported Standing balance-Leahy Scale: Poor                              ADL Overall ADL's : Needs assistance/impaired;At baseline Eating/Feeding: Set up;Sitting   Grooming: Set  up;Sitting   Upper Body Bathing: Set up;Sitting   Lower Body Bathing: Supervison/ safety;Sitting/lateral leans   Upper Body Dressing : Set up;Sitting   Lower Body Dressing: Supervision/safety;Sitting/lateral leans Lower Body Dressing Details (indicate cue type and reason): Able to don socks sitting EOB Toilet Transfer: Supervision/safety;Stand-pivot;BSC StatisticianToilet Transfer Details (indicate cue type and reason): Simulated by stand pivot from EOB to chair         Functional mobility during ADLs: Supervision/safety (for stand pivot)       Vision Vision Assessment?: No apparent visual deficits   Perception     Praxis      Pertinent Vitals/Pain Pain Assessment: Faces Faces Pain Scale: Hurts little more Pain Location: chronic back pain Pain Descriptors / Indicators: Aching Pain Intervention(s): Monitored during session;Repositioned     Hand Dominance Right   Extremity/Trunk Assessment Upper Extremity Assessment Upper Extremity Assessment: LUE deficits/detail LUE Deficits / Details: Deficits from prior CVA but overall WFL.   Lower Extremity Assessment Lower Extremity Assessment: Defer to PT evaluation   Cervical / Trunk Assessment Cervical / Trunk Assessment: Kyphotic   Communication Communication Communication: No difficulties   Cognition Arousal/Alertness: Awake/alert Behavior During Therapy: WFL for tasks assessed/performed Overall Cognitive Status: Within Functional Limits for tasks assessed                     General Comments       Exercises       Shoulder Instructions      Home Living Family/patient expects to be discharged to:: Assisted living  Prior Functioning/Environment Level of Independence: Needs assistance  Gait / Transfers Assistance Needed: Mainly performs mobility at w/c level. Occasional use of RW for short distance mobility. Transfers independently. ADL's / Homemaking  Assistance Needed: Pt reports he is independent with bathing and dressing. Facility prepares meals            OT Problem List:     OT Treatment/Interventions:      OT Goals(Current goals can be found in the care plan section) Acute Rehab OT Goals Patient Stated Goal: return to ALF OT Goal Formulation: All assessment and education complete, DC therapy  OT Frequency:     Barriers to D/C:            Co-evaluation              End of Session Nurse Communication: Mobility status;Other (comment) (chair alarm cord not present in room)  Activity Tolerance: Patient tolerated treatment well Patient left: in chair;with call bell/phone within reach;with chair alarm set   Time: 0813-0829 OT Time Calculation (min): 16 min Charges:  OT General Charges $OT Visit: 1 Procedure OT Evaluation $OT Eval Moderate Complexity: 1 Procedure G-Codes:     Gaye Alken M.S., OTR/L Pager: [phone]  06/10/2016, 8:37 AM

## 2016-06-11 LAB — GLUCOSE, CAPILLARY
GLUCOSE-CAPILLARY: 88 mg/dL (ref 65–99)
GLUCOSE-CAPILLARY: 121 mg/dL — AB (ref 65–99)
GLUCOSE-CAPILLARY: 90 mg/dL (ref 65–99)
GLUCOSE-CAPILLARY: 94 mg/dL (ref 65–99)
Glucose-Capillary: 106 mg/dL — ABNORMAL HIGH (ref 65–99)
Glucose-Capillary: 96 mg/dL (ref 65–99)

## 2016-06-11 MED ORDER — TRAMADOL HCL 50 MG PO TABS
50.0000 mg | ORAL_TABLET | Freq: Once | ORAL | Status: AC | PRN
  Administered 2016-06-11: 100 mg via ORAL
  Filled 2016-06-11: qty 2

## 2016-06-11 MED ORDER — OXYCODONE-ACETAMINOPHEN 5-325 MG PO TABS
1.0000 | ORAL_TABLET | Freq: Four times a day (QID) | ORAL | Status: DC | PRN
  Administered 2016-06-11 – 2016-06-12 (×3): 2 via ORAL
  Filled 2016-06-11 (×3): qty 2

## 2016-06-11 MED ORDER — LIDOCAINE 5 % EX PTCH
1.0000 | MEDICATED_PATCH | CUTANEOUS | Status: AC
  Administered 2016-06-11: 1 via TRANSDERMAL
  Filled 2016-06-11: qty 1

## 2016-06-11 NOTE — Progress Notes (Signed)
Pt is asking to speak with the MD. MD notified.

## 2016-06-11 NOTE — Progress Notes (Signed)
Pt requested that his IV be removed. MD notified. MD said that pt could have IV removed and no IV access.

## 2016-06-11 NOTE — Progress Notes (Signed)
Nutrition Follow-up  DOCUMENTATION CODES:   Obesity unspecified  INTERVENTION:   -Continue with regular diet  NUTRITION DIAGNOSIS:   Inadequate oral intake related to inability to eat as evidenced by NPO status.  Resolved; no new nutrition dx at this time  GOAL:   Patient will meet greater than or equal to 90% of their needs  Met  MONITOR:   PO intake, Labs, Weight trends, Skin, I & O's  REASON FOR ASSESSMENT:   Consult Enteral/tube feeding initiation and management  ASSESSMENT:   61 y.o. male from SNF due to old right ACA infarct, now admitted with AMS and required intubation in ED for airway protection.  Initial head CT negative for hemorrhage.  DDx acute infarct vs seizures vs opiate overdose.  2/11- extubated 2/12- transferred from ICU to medical floor  Pt looking out window and fidgeting with blinds at time of visit.   Pt on a regular diet with good appetite. Meal completion 100%.  CSW following. Pt to be evaluated by PASRR prior to return to ALF.  Labs reviewed: CBGS: 88-132.  Diet Order:  Diet regular Room service appropriate? Yes; Fluid consistency: Thin Diet - low sodium heart healthy  Skin:  Reviewed, no issues  Last BM:  06/07/16  Height:   Ht Readings from Last 1 Encounters:  06/04/16 5' 8"  (1.727 m)    Weight:   Wt Readings from Last 1 Encounters:  06/11/16 191 lb 5.8 oz (86.8 kg)    Ideal Body Weight:  70 kg  BMI:  Body mass index is 29.1 kg/m.  Estimated Nutritional Needs:   Kcal:  1700-1900  Protein:  85-100 grams  Fluid:  1.7-1.9 L  EDUCATION NEEDS:   No education needs identified at this time  Leonetta Mcgivern A. Jimmye Norman, RD, LDN, CDE Pager: [phone] After hours Pager: [phone]

## 2016-06-12 LAB — GLUCOSE, CAPILLARY
GLUCOSE-CAPILLARY: 81 mg/dL (ref 65–99)
GLUCOSE-CAPILLARY: 99 mg/dL (ref 65–99)
Glucose-Capillary: 106 mg/dL — ABNORMAL HIGH (ref 65–99)
Glucose-Capillary: 90 mg/dL (ref 65–99)

## 2016-06-12 NOTE — Progress Notes (Signed)
Jacinto HalimJames Speros to be D/C'd Skilled nursing facility per MD order.  Discussed with the patient and all questions fully answered.  VSS, Skin clean, dry and intact without evidence of skin break down, no evidence of skin tears noted. IV catheter discontinued intact. Site without signs and symptoms of complications. Dressing and pressure applied.  An After Visit Summary was printed and given to the patient. Patient received prescription.  D/c education completed with patient/family including follow up instructions, medication list, d/c activities limitations if indicated, with other d/c instructions as indicated by MD - patient able to verbalize understanding, all questions fully answered.   Patient instructed to return to ED, call 911, or call MD for any changes in condition.   Patient escorted via WC, and D/C home via private auto.  Eligah Eastrin M Keelyn Fjelstad 06/12/2016 4:01 PM

## 2016-06-12 NOTE — Progress Notes (Signed)
Patient will DC to: [facility] Anticipated DC date: 06/12/16 Family notified: N/A Transport by: Sharin MonsPTAR   Per MD patient ready for DC to [facility]. RN, patient, patient's family, and facility notified of DC. Discharge Summary sent to facility. RN given number for report. DC packet on chart. Ambulance transport requested for patient.   CSW signing off.  Cristobal GoldmannNadia Dwon Sky, [place]CS[place] Clinical Social Worker [phone]

## 2016-06-12 NOTE — Progress Notes (Signed)
Pasrr received: [phone]. Patient is able to discharge today back to ALF.   Osborne Cascoadia Quadasia Newsham [name] [phone]

## 2016-06-12 NOTE — Care Management Important Message (Signed)
Important Message  Patient Details  Name: Terrance Cook MRN: [id number] Date of Birth: 1955/05/19   Medicare Important Message Given:  Yes    [name] Abena 06/12/2016, 1:10 PM

## 2016-06-15 ENCOUNTER — Emergency Department (HOSPITAL_COMMUNITY)
Admission: EM | Admit: 2016-06-15 | Discharge: 2016-06-15 | Disposition: A | Discharge status: Home / Self Care | Payer: Medicare Other | Attending: Emergency Medicine | Admitting: Emergency Medicine

## 2016-06-15 ENCOUNTER — Emergency Department (HOSPITAL_COMMUNITY): Payer: Medicare Other

## 2016-06-15 ENCOUNTER — Encounter (HOSPITAL_COMMUNITY): Payer: Self-pay | Admitting: Nurse Practitioner

## 2016-06-15 DIAGNOSIS — F17210 Nicotine dependence, cigarettes, uncomplicated: Secondary | ICD-10-CM | POA: Insufficient documentation

## 2016-06-15 DIAGNOSIS — E119 Type 2 diabetes mellitus without complications: Secondary | ICD-10-CM | POA: Diagnosis not present

## 2016-06-15 DIAGNOSIS — R0789 Other chest pain: Secondary | ICD-10-CM | POA: Insufficient documentation

## 2016-06-15 DIAGNOSIS — Z79899 Other long term (current) drug therapy: Secondary | ICD-10-CM | POA: Diagnosis not present

## 2016-06-15 DIAGNOSIS — G8929 Other chronic pain: Secondary | ICD-10-CM | POA: Diagnosis not present

## 2016-06-15 DIAGNOSIS — M546 Pain in thoracic spine: Secondary | ICD-10-CM | POA: Insufficient documentation

## 2016-06-15 DIAGNOSIS — Z8673 Personal history of transient ischemic attack (TIA), and cerebral infarction without residual deficits: Secondary | ICD-10-CM | POA: Insufficient documentation

## 2016-06-15 DIAGNOSIS — I2510 Atherosclerotic heart disease of native coronary artery without angina pectoris: Secondary | ICD-10-CM | POA: Diagnosis not present

## 2016-06-15 DIAGNOSIS — I10 Essential (primary) hypertension: Secondary | ICD-10-CM | POA: Insufficient documentation

## 2016-06-15 DIAGNOSIS — Z7982 Long term (current) use of aspirin: Secondary | ICD-10-CM | POA: Insufficient documentation

## 2016-06-15 LAB — CBC WITH DIFFERENTIAL/PLATELET
Basophils Absolute: 0 10*3/uL (ref 0.0–0.1)
Basophils Relative: 0 %
EOS ABS: 0.1 10*3/uL (ref 0.0–0.7)
EOS PCT: 2 %
HCT: 35 % — ABNORMAL LOW (ref 39.0–52.0)
Hemoglobin: 11.3 g/dL — ABNORMAL LOW (ref 13.0–17.0)
LYMPHS ABS: 3.1 10*3/uL (ref 0.7–4.0)
Lymphocytes Relative: 52 %
MCH: 26.3 pg (ref 26.0–34.0)
MCHC: 32.3 g/dL (ref 30.0–36.0)
MCV: 81.4 fL (ref 78.0–100.0)
MONOS PCT: 8 %
Monocytes Absolute: 0.5 10*3/uL (ref 0.1–1.0)
Neutro Abs: 2.3 10*3/uL (ref 1.7–7.7)
Neutrophils Relative %: 38 %
PLATELETS: 182 10*3/uL (ref 150–400)
RBC: 4.3 MIL/uL (ref 4.22–5.81)
RDW: 16.2 % — ABNORMAL HIGH (ref 11.5–15.5)
WBC: 6.1 10*3/uL (ref 4.0–10.5)

## 2016-06-15 LAB — BASIC METABOLIC PANEL
Anion gap: 7 (ref 5–15)
BUN: 7 mg/dL (ref 6–20)
CALCIUM: 9.2 mg/dL (ref 8.9–10.3)
CO2: 23 mmol/L (ref 22–32)
CREATININE: 0.69 mg/dL (ref 0.61–1.24)
Chloride: 113 mmol/L — ABNORMAL HIGH (ref 101–111)
GFR calc Af Amer: >60 mL/min (ref >60)
Glucose, Bld: 93 mg/dL (ref 65–99)
Potassium: 3.4 mmol/L — ABNORMAL LOW (ref 3.5–5.1)
Sodium: 143 mmol/L (ref 135–145)

## 2016-06-15 LAB — I-STAT TROPONIN, ED: TROPONIN I, POC: 0 ng/mL (ref 0.00–0.08)

## 2016-06-15 LAB — OXYCODONES,MS,WB/SP RFX
OXYCOCONE: 6.9 ng/mL
OXYMORPHONE: NEGATIVE ng/mL
Oxycodones Confirmation: POSITIVE

## 2016-06-15 LAB — D-DIMER, QUANTITATIVE: D-Dimer, Quant: 0.27 ug/mL-FEU (ref 0.00–0.50)

## 2016-06-15 MED ORDER — NAPROXEN 500 MG PO TABS
500.0000 mg | ORAL_TABLET | Freq: Once | ORAL | Status: AC
  Administered 2016-06-15: 500 mg via ORAL
  Filled 2016-06-15: qty 1

## 2016-06-15 MED ORDER — IBUPROFEN 400 MG PO TABS: 400.0000 mg | ORAL_TABLET | Freq: Four times a day (QID) | ORAL | 0 refills | Status: AC | PRN

## 2016-06-15 MED ORDER — ACETAMINOPHEN 325 MG PO TABS
650.0000 mg | ORAL_TABLET | Freq: Once | ORAL | Status: AC
  Administered 2016-06-15: 650 mg via ORAL
  Filled 2016-06-15: qty 2

## 2016-06-15 NOTE — ED Provider Notes (Signed)
[facility]-EMERGENCY DEPT Provider Note   CSN: [id number] Arrival date & time: 06/15/16  0321   By signing my name below, I, Teofilo PodMatthew P. Jamison, attest that this documentation has been prepared under the direction and in the presence of Shon Batonourtney F Horton, MD . Electronically Signed: Teofilo PodMatthew P. Jamison, ED Scribe. 06/15/2016. 3:50 AM.   History   Chief Complaint Chief Complaint  Patient presents with  . Back Pain  . Chest Wall Pain   The history is provided by the patient. No language interpreter was used.   HPI Comments:  Terrance Cook is a 61 y.o. male with PMHx of chronic back pain who presents to the Emergency Department BIB EMS complaining of constant chest and back pain that began PTA. Pt rates his pain at 9/10, and describes the back pain as "pinching." Pt complains of associated productive cough with yellow sputum and difficulty breathing. [facility] after a 13 day admission. Pt reports a compression fracture in his spine 30 years ago. Pt is a smoker but states that he has been smoking less. Pt takes ibuprofen multiple times daily for chronic pain. Pt denies leg swelling, fever. Patient denies any weakness, numbness, tingling, difficulty, difficulty with urination or defecation.  Patient was admitted to [facility] with presumed aspiration pneumonia after being found somnolent. He had an acute encephalopathy. Thought to be somewhat secondary to narcotic abuse.  Past Medical History:  Diagnosis Date  . Anxiety 01/04/2015  . CAD (coronary artery disease) 01/04/2015  . Chronic pain 01/04/2015  . Coronary artery disease   . Diabetes mellitus without complication (HCC)   . H/O: CVA (cerebrovascular accident) 01/04/2015  . History of cardiac cath   . Hyperlipidemia 01/04/2015  . Hypertension   . MDD (major depressive disorder) 01/04/2015  . Schizoaffective disorder (HCC) 01/04/2015  . 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 Stroke (HCC)   . Tobacco use  01/04/2015    Patient Active Problem List   Diagnosis Date Noted  . Narcotic overdose 06/09/2016  . Slurred speech   . Leg edema, left   . Encounter for orogastric (OG) tube placement   . Acute on chronic respiratory failure with hypoxemia (HCC)   . Encounter for intubation   . Chest pain   . Typical angina (HCC) 01/04/2015  . Hyperlipidemia 01/04/2015  . Tobacco use 01/04/2015  . MDD (major depressive disorder) 01/04/2015  . Anxiety 01/04/2015  . Encephalopathy 01/04/2015  . Schizoaffective disorder (HCC) 01/04/2015  . H/O: CVA (cerebrovascular accident) 01/04/2015  . Chronic pain 01/04/2015  . CAD (coronary artery disease) 01/04/2015  . Ang[facility] at [facility](HCC) 01/04/2015  . Postoperative anemia due to acute blood loss   . Femur fracture (HCC)   . Femur fracture, left (HCC) 08/14/2014  . Hip fracture (HCC) 08/14/2014  . Acute encephalopathy 08/14/2014  . Fall   . Left hemiparesis (HCC) 08/09/2014  . Diabetes mellitus type 2, controlled, without complications (HCC) 08/09/2014  . Essential hypertension 08/09/2014  . Acute respiratory failure with hypercapnia (HCC) 08/09/2014  . Altered mental status 08/08/2014    Past Surgical History:  Procedure Laterality Date  . CARDIAC SURGERY    . FEMUR FRACTURE SURGERY    . FEMUR IM NAIL Left 08/15/2014   Procedure: Operative Fixation left periprosthetic femur fracture;  Surgeon: Tarry KosNaiping M Xu, MD;  Location: [facility] OR;  Service: Orthopedics;  Laterality: Left;  . HARDWARE REMOVAL Left 08/15/2014   Procedure: HARDWARE REMOVAL;  Surgeon: Coralyn MarkNaiping  Donnelly Stager, MD;  Location: [facility] OR;  Service: Orthopedics;  Laterality: Left;       Home Medications    Prior to Admission medications   Medication Sig Start Date End Date Taking? Authorizing Provider  amLODipine (NORVASC) 10 MG tablet Take 5 mg by mouth daily.     Historical Provider, MD  aspirin 81 MG chewable tablet Chew 81 mg by mouth daily.    Historical Provider, MD  atorvastatin (LIPITOR) 20 MG  tablet Take 20 mg by mouth daily at 6 PM.     Historical Provider, MD  bisacodyl (DULCOLAX) 5 MG EC tablet Take 1 tablet (5 mg total) by mouth daily as needed for moderate constipation. Patient not taking: Reported on 06/04/2016 08/17/14   Rodolph Bong, MD  carvedilol (COREG) 3.125 MG tablet Take 1 tablet (3.125 mg total) by mouth 2 (two) times daily with a meal. 01/06/15   Vassie Loll, MD  clonazePAM (KLONOPIN) 0.5 MG tablet Take 0.25 mg by mouth 2 (two) times daily as needed for anxiety.    Historical Provider, MD  cloNIDine (CATAPRES - DOSED IN MG/24 HR) 0.1 mg/24hr patch Place 1 patch (0.1 mg total) onto the skin once a week. 06/09/16   Rolly Salter, MD  diclofenac sodium (VOLTAREN) 1 % GEL Apply 2 g topically 2 (two) times daily. Apply to lower back, left hip, and left knee    Historical Provider, MD  febuxostat (ULORIC) 40 MG tablet Take 40 mg by mouth daily.    Historical Provider, MD  furosemide (LASIX) 20 MG tablet Take 40 mg by mouth daily.    Historical Provider, MD  gabapentin (NEURONTIN) 300 MG capsule Take 300 mg by mouth 2 (two) times daily.    Historical Provider, MD  ibuprofen (ADVIL,MOTRIN) 200 MG tablet Take 200 mg by mouth 2 (two) times daily.    Historical Provider, MD  ibuprofen (ADVIL,MOTRIN) 400 MG tablet Take 1 tablet (400 mg total) by mouth every 6 (six) hours as needed. 06/15/16   Shon Baton, MD  lamoTRIgine (LAMICTAL) 100 MG tablet Take 50 mg by mouth every evening.    Historical Provider, MD  lamoTRIgine (LAMICTAL) 25 MG tablet Take 25 mg by mouth every morning.     Historical Provider, MD  levETIRAcetam (KEPPRA) 1000 MG tablet Take 1,000 mg by mouth 2 (two) times daily.    Historical Provider, MD  lisinopril (PRINIVIL,ZESTRIL) 20 MG tablet Take 40 mg by mouth daily.    Historical Provider, MD  Melatonin 1 MG TABS Take 1 mg by mouth at bedtime.    Historical Provider, MD  methocarbamol (ROBAXIN) 500 MG tablet Take 1 tablet (500 mg total) by mouth every 8  (eight) hours as needed for muscle spasms. 06/09/16   Rolly Salter, MD  nicotine (NICODERM CQ - DOSED IN MG/24 HOURS) 21 mg/24hr patch Place 1 patch (21 mg total) onto the skin daily. Patient not taking: Reported on 06/04/2016 01/06/15   Vassie Loll, MD  nitroGLYCERIN (NITROSTAT) 0.4 MG SL tablet Place 0.4 mg under the tongue every 5 (five) minutes as needed for chest pain.    Historical Provider, MD  pantoprazole (PROTONIX) 40 MG tablet Take 1 tablet (40 mg total) by mouth daily at 6 (six) AM. 01/06/15   Vassie Loll, MD  PARoxetine (PAXIL) 10 MG tablet Take 10 mg by mouth daily.    Historical Provider, MD  QUEtiapine (SEROQUEL) 25 MG tablet Take 25 mg by mouth at bedtime.    Historical Provider,  MD  senna (SENOKOT) 8.6 MG TABS tablet Take 2 tablets by mouth daily.    Historical Provider, MD  tamsulosin (FLOMAX) 0.4 MG CAPS capsule Take 0.4 mg by mouth daily.    Historical Provider, MD    Family History Family History  Problem Relation Age of Onset  . Heart attack Mother     Social History Social History  Substance Use Topics  . Smoking status: Current Every Day Smoker    Packs/day: 0.50    Years: 45.00    Types: Cigarettes  . Smokeless tobacco: Never Used  . Alcohol use No     Allergies   Patient has no known allergies.   Review of Systems Review of Systems  Constitutional: Negative for fever.  Cardiovascular: Positive for chest pain. Negative for leg swelling.  Genitourinary: Negative for dysuria.  Musculoskeletal: Positive for back pain.  Neurological: Negative for weakness and numbness.     Physical Exam Updated Vital Signs BP 162/73   Pulse 65   Temp 98.5 F (36.9 C) (Oral)   Resp 17   Ht 5\' 8"  (1.727 m)   Wt 196 lb (88.9 kg)   SpO2 97%   BMI 29.80 kg/m   Physical Exam  Constitutional: He is oriented to person, place, and time. He appears well-developed and well-nourished. No distress.  HENT:  Head: Normocephalic and atraumatic.  Cardiovascular:  Normal rate, regular rhythm and normal heart sounds.   No murmur heard. Pulmonary/Chest: Effort normal and breath sounds normal. No respiratory distress. He has no wheezes. He exhibits tenderness.  Abdominal: Soft. Bowel sounds are normal. There is no tenderness. There is no rebound.  Musculoskeletal: He exhibits no edema.  Tenderness palpation mid thoracic spine without step off or deformity  Neurological: He is alert and oriented to person, place, and time.  5 out of 5 strength bilateral upper extremities, 5 out of 5 strength right lower extremity in all muscle groups, decreased strength with plantar and dorsiflexion of the left foot which is at baseline  Skin: Skin is warm and dry.  Psychiatric: He has a normal mood and affect.  Nursing note and vitals reviewed.    ED Treatments / Results  DIAGNOSTIC STUDIES:  Oxygen Saturation is 97% on RA, normal by my interpretation.    COORDINATION OF CARE:  5:28 AM Discussed treatment plan with pt at bedside and pt agreed to plan.   Labs (all labs ordered are listed, but only abnormal results are displayed) Labs Reviewed  CBC WITH DIFFERENTIAL/PLATELET - Abnormal; Notable for the following:       Result Value   Hemoglobin 11.3 (*)    HCT 35.0 (*)    RDW 16.2 (*)    All other components within normal limits  BASIC METABOLIC PANEL - Abnormal; Notable for the following:    Potassium 3.4 (*)    Chloride 113 (*)    All other components within normal limits  D-DIMER, QUANTITATIVE (NOT AT [facility])  I-STAT TROPOININ, ED    EKG  EKG Interpretation  Date/Time:  Monday June 15 2016 03:25:58 EST Ventricular Rate:  70 PR Interval:    QRS Duration: 88 QT Interval:  399 QTC Calculation: 431 R Axis:   71 Text Interpretation:  Sinus rhythm Confirmed by Wilkie Aye  MD, COURTNEY ([id number]) on 06/15/2016 4:52:15 AM       Radiology Dg Chest 2 View  Result Date: 06/15/2016 CLINICAL DATA:  Initial evaluation for acute chest and back pain. EXAM:  CHEST  2 VIEW COMPARISON:  Prior radiograph 06/03/2015. FINDINGS: The cardiac and mediastinal silhouettes are stable in size and contour, and remain within normal limits. The lungs are normally inflated. No airspace consolidation, pleural effusion, or pulmonary edema is identified. There is no pneumothorax. Exaggeration of the normal thoracic kyphosis with compression deformity within the mid thoracic spine, similar to previous. Remotely healed left-sided rib fracture. No acute osseous abnormality. IMPRESSION: 1. No active cardiopulmonary disease. 2. Remote compression deformity within the mid thoracic spine, similar to previous. Electronically Signed   By: Rise Mu M.D.   On: 06/15/2016 04:27    Procedures Procedures (including critical care time)  Medications Ordered in ED Medications  naproxen (NAPROSYN) tablet 500 mg (not administered)  acetaminophen (TYLENOL) tablet 650 mg (650 mg Oral Given 06/15/16 0359)     Initial Impression / Assessment and Plan / ED Course  I have reviewed the triage vital signs and the nursing notes.  Pertinent labs & imaging results that were available during my care of the patient were reviewed by me and considered in my medical decision making (see chart for details).     Patient presents with chest pain and back pain. History of chronic pain and opioid abuse with recent hospitalization for encephalopathy likely secondary to narcotic abuse and aspiration pneumonia. He is nontoxic. Afebrile. Vital signs reassuring. He has reproducible chest wall tenderness as well as tenderness over thoracic spine. He has a known compression deformity. He is at his neurologic baseline.  Doubt ACS. EKG is nonischemic. Troponin negative. D-dimer is negative. Basic labwork is otherwise reassuring. Chest x-ray shows no evidence of residual or recurrent infiltrate. On recheck, patient is resting comfortably. He received Tylenol and Motrin for his pain. Discussed with the  patient that he would not be receiving any further pain medication given his history. Patient is understanding.  After history, exam, and medical workup I feel the patient has been appropriately medically screened and is safe for discharge home. Pertinent diagnoses were discussed with the patient. Patient was given return precautions.  Final Clinical Impressions(s) / ED Diagnoses   Final diagnoses:  Chest wall pain  Chronic midline thoracic back pain    New Prescriptions New Prescriptions   IBUPROFEN (ADVIL,MOTRIN) 400 MG TABLET    Take 1 tablet (400 mg total) by mouth every 6 (six) hours as needed.   I personally performed the services described in this documentation, which was scribed in my presence. The recorded information has been reviewed and is accurate.     Shon Baton, MD 06/15/16 0530

## 2016-06-15 NOTE — ED Notes (Signed)
Patient transported to X-ray 

## 2016-06-15 NOTE — ED Triage Notes (Signed)
[facility]. Per EMS patient is c/o lower back pain and chest wall pain. He recently had a 13 day admission which warranted him to be intubated for 3 days due to complications of PNA. He also informed ems that he was weaned off of his opiates for his chronic pain during this time. He does have a productive cough. Afebrile at 98.1. He has a hx of HTN and have not taken his anti-hypertensive medications. BP 194/100, 64, 98% RA. He denies any shortness of breath but does state it is sore when he takes a deep breath.

## 2016-06-15 NOTE — ED Notes (Signed)
Bed: [id number] Expected date:  Expected time:  Means of arrival:  Comments: 61 yo M/ Flank pain-chest wall pain

## 2016-06-15 NOTE — Discharge Instructions (Signed)
You were seen today for chest pain and back pain. Your workup is reassuring. He has no evidence of heart attack at this time or pneumonia. Your back pain is chronic. You will not be started on any further opioid medications given your history. Take anti-inflammatories as needed.

## 2016-06-16 LAB — BENZODIAZEPINES,MS,WB/SP RFX
7-Aminoclonazepam: NEGATIVE ng/mL
Alprazolam: NEGATIVE ng/mL
BENZODIAZEPINES CONFIRM: POSITIVE
CHLORDIAZEPOXIDE: NEGATIVE ng/mL
Clonazepam: 5 ng/mL
DESMETHYLCHLORDIAZEPOXIDE: NEGATIVE ng/mL
DESMETHYLDIAZEPAM: NEGATIVE ng/mL
DIAZEPAM: NEGATIVE ng/mL
Desalkylflurazepam: NEGATIVE ng/mL
Flurazepam: NEGATIVE ng/mL
LORAZEPAM: NEGATIVE ng/mL
MIDAZOLAM: 16 ng/mL
Oxazepam: NEGATIVE ng/mL
TEMAZEPAM: NEGATIVE ng/mL
Triazolam: NEGATIVE ng/mL

## 2016-06-16 LAB — DRUG SCREEN 10 W/CONF, SERUM
AMPHETAMINES, IA: NEGATIVE ng/mL
BENZODIAZEPINES, IA: POSITIVE ng/mL
Barbiturates, IA: NEGATIVE ug/mL
COCAINE & METABOLITE, IA: NEGATIVE ng/mL
Methadone, IA: NEGATIVE ng/mL
OXYCODONES, IA: POSITIVE ng/mL
Opiates, IA: NEGATIVE ng/mL
PHENCYCLIDINE, IA: NEGATIVE ng/mL
PROPOXYPHENE, IA: NEGATIVE ng/mL
THC(Marijuana) Metabolite, IA: POSITIVE ng/mL

## 2016-06-21 ENCOUNTER — Emergency Department (HOSPITAL_COMMUNITY)
Admission: EM | Admit: 2016-06-21 | Discharge: 2016-06-22 | Disposition: A | Discharge status: Home / Self Care | Payer: Medicare Other | Attending: Emergency Medicine | Admitting: Emergency Medicine

## 2016-06-21 ENCOUNTER — Encounter (HOSPITAL_COMMUNITY): Payer: Self-pay | Admitting: Nurse Practitioner

## 2016-06-21 DIAGNOSIS — Z8673 Personal history of transient ischemic attack (TIA), and cerebral infarction without residual deficits: Secondary | ICD-10-CM | POA: Insufficient documentation

## 2016-06-21 DIAGNOSIS — Z79899 Other long term (current) drug therapy: Secondary | ICD-10-CM | POA: Diagnosis not present

## 2016-06-21 DIAGNOSIS — E119 Type 2 diabetes mellitus without complications: Secondary | ICD-10-CM | POA: Diagnosis not present

## 2016-06-21 DIAGNOSIS — I2510 Atherosclerotic heart disease of native coronary artery without angina pectoris: Secondary | ICD-10-CM | POA: Diagnosis not present

## 2016-06-21 DIAGNOSIS — R4689 Other symptoms and signs involving appearance and behavior: Secondary | ICD-10-CM

## 2016-06-21 DIAGNOSIS — Z7982 Long term (current) use of aspirin: Secondary | ICD-10-CM | POA: Diagnosis not present

## 2016-06-21 DIAGNOSIS — F17200 Nicotine dependence, unspecified, uncomplicated: Secondary | ICD-10-CM | POA: Diagnosis not present

## 2016-06-21 DIAGNOSIS — I10 Essential (primary) hypertension: Secondary | ICD-10-CM | POA: Diagnosis not present

## 2016-06-21 DIAGNOSIS — M549 Dorsalgia, unspecified: Secondary | ICD-10-CM | POA: Diagnosis present

## 2016-06-21 DIAGNOSIS — F918 Other conduct disorders: Secondary | ICD-10-CM | POA: Insufficient documentation

## 2016-06-21 NOTE — ED Notes (Signed)
Bed: [id number] Expected date:  Expected time:  Means of arrival:  Comments: [employer] IVC

## 2016-06-22 DIAGNOSIS — F918 Other conduct disorders: Secondary | ICD-10-CM | POA: Diagnosis not present

## 2016-06-22 LAB — CBC WITH DIFFERENTIAL/PLATELET
BASOS ABS: 0 10*3/uL (ref 0.0–0.1)
Basophils Relative: 0 %
EOS ABS: 0.2 10*3/uL (ref 0.0–0.7)
Eosinophils Relative: 4 %
HCT: 36.1 % — ABNORMAL LOW (ref 39.0–52.0)
HEMOGLOBIN: 11.4 g/dL — AB (ref 13.0–17.0)
LYMPHS ABS: 3.1 10*3/uL (ref 0.7–4.0)
Lymphocytes Relative: 57 %
MCH: 25.8 pg — AB (ref 26.0–34.0)
MCHC: 31.6 g/dL (ref 30.0–36.0)
MCV: 81.7 fL (ref 78.0–100.0)
Monocytes Absolute: 0.3 10*3/uL (ref 0.1–1.0)
Monocytes Relative: 6 %
NEUTROS PCT: 33 %
Neutro Abs: 1.8 10*3/uL (ref 1.7–7.7)
PLATELETS: 260 10*3/uL (ref 150–400)
RBC: 4.42 MIL/uL (ref 4.22–5.81)
RDW: 16.1 % — ABNORMAL HIGH (ref 11.5–15.5)
WBC: 5.4 10*3/uL (ref 4.0–10.5)

## 2016-06-22 LAB — COMPREHENSIVE METABOLIC PANEL
ALT: 15 U/L — ABNORMAL LOW (ref 17–63)
ANION GAP: 7 (ref 5–15)
AST: 20 U/L (ref 15–41)
Albumin: 4.1 g/dL (ref 3.5–5.0)
Alkaline Phosphatase: 34 U/L — ABNORMAL LOW (ref 38–126)
BUN: 7 mg/dL (ref 6–20)
CALCIUM: 9.2 mg/dL (ref 8.9–10.3)
CHLORIDE: 112 mmol/L — AB (ref 101–111)
CO2: 26 mmol/L (ref 22–32)
Creatinine, Ser: 0.74 mg/dL (ref 0.61–1.24)
Glucose, Bld: 102 mg/dL — ABNORMAL HIGH (ref 65–99)
Potassium: 3.3 mmol/L — ABNORMAL LOW (ref 3.5–5.1)
Sodium: 145 mmol/L (ref 135–145)
Total Bilirubin: 0.3 mg/dL (ref 0.3–1.2)
Total Protein: 6.9 g/dL (ref 6.5–8.1)

## 2016-06-22 LAB — ETHANOL: ALCOHOL ETHYL (B): 31 mg/dL — AB (ref <5)

## 2016-06-22 NOTE — Discharge Instructions (Signed)
[name] behaved aggressively, but he has been co-operative here, and as I discussed with Ms. Lowanda FosterBrittany - overall this behavior was triggered by the visitor and subsequent encounters.

## 2016-06-22 NOTE — ED Notes (Signed)
Pt is A&O x4. States "nothing is wrong with me if those people in that place would just leave me alone." Denies any SI/HI. Pt is cooperative and calm currently during care.

## 2016-06-22 NOTE — ED Notes (Signed)
MADE FIRST REQUEST FOR URINE ,PATENT UNABLE TO PROVIDE ONE AT THIS TIME

## 2016-06-22 NOTE — ED Triage Notes (Signed)
[facility] [employer] [facility]. Per [employer] officers were called out earlier to the facility because the patient was being verbally abusive and found smoking marijuana. He later calmed down. They were then called back this evening and informed he was IVC due to being combative and uncooperative with staff at facility. When [employer] arrived Pt would not pay them attention and was uncooperative. He was forced to be placed into handcuffs and brought in for evaluation.
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
## 2016-06-22 NOTE — ED Notes (Signed)
[facility] notified of need for transport of pt back to residence.  

## 2016-09-13 ENCOUNTER — Encounter (HOSPITAL_COMMUNITY): Payer: Self-pay

## 2016-09-13 ENCOUNTER — Emergency Department (HOSPITAL_COMMUNITY)
Admission: EM | Admit: 2016-09-13 | Discharge: 2016-09-14 | Disposition: A | Discharge status: Home / Self Care | Payer: Medicare Other | Attending: Emergency Medicine | Admitting: Emergency Medicine

## 2016-09-13 DIAGNOSIS — F17210 Nicotine dependence, cigarettes, uncomplicated: Secondary | ICD-10-CM | POA: Insufficient documentation

## 2016-09-13 DIAGNOSIS — I10 Essential (primary) hypertension: Secondary | ICD-10-CM | POA: Insufficient documentation

## 2016-09-13 DIAGNOSIS — F918 Other conduct disorders: Secondary | ICD-10-CM | POA: Insufficient documentation

## 2016-09-13 DIAGNOSIS — Z8673 Personal history of transient ischemic attack (TIA), and cerebral infarction without residual deficits: Secondary | ICD-10-CM | POA: Insufficient documentation

## 2016-09-13 DIAGNOSIS — E119 Type 2 diabetes mellitus without complications: Secondary | ICD-10-CM | POA: Diagnosis not present

## 2016-09-13 DIAGNOSIS — R4689 Other symptoms and signs involving appearance and behavior: Secondary | ICD-10-CM

## 2016-09-13 DIAGNOSIS — Z79899 Other long term (current) drug therapy: Secondary | ICD-10-CM | POA: Insufficient documentation

## 2016-09-13 DIAGNOSIS — F1014 Alcohol abuse with alcohol-induced mood disorder: Secondary | ICD-10-CM | POA: Diagnosis not present

## 2016-09-13 DIAGNOSIS — I2510 Atherosclerotic heart disease of native coronary artery without angina pectoris: Secondary | ICD-10-CM | POA: Diagnosis not present

## 2016-09-13 DIAGNOSIS — Z7982 Long term (current) use of aspirin: Secondary | ICD-10-CM | POA: Diagnosis not present

## 2016-09-13 DIAGNOSIS — F329 Major depressive disorder, single episode, unspecified: Secondary | ICD-10-CM | POA: Diagnosis present

## 2016-09-13 LAB — CBC WITH DIFFERENTIAL/PLATELET
BASOS ABS: 0 10*3/uL (ref 0.0–0.1)
Basophils Relative: 1 %
EOS ABS: 0.1 10*3/uL (ref 0.0–0.7)
EOS PCT: 2 %
HCT: 34.8 % — ABNORMAL LOW (ref 39.0–52.0)
Hemoglobin: 10.8 g/dL — ABNORMAL LOW (ref 13.0–17.0)
Lymphocytes Relative: 57 %
Lymphs Abs: 4 10*3/uL (ref 0.7–4.0)
MCH: 24.6 pg — ABNORMAL LOW (ref 26.0–34.0)
MCHC: 31 g/dL (ref 30.0–36.0)
MCV: 79.3 fL (ref 78.0–100.0)
MONO ABS: 0.5 10*3/uL (ref 0.1–1.0)
Monocytes Relative: 7 %
Neutro Abs: 2.3 10*3/uL (ref 1.7–7.7)
Neutrophils Relative %: 33 %
PLATELETS: 231 10*3/uL (ref 150–400)
RBC: 4.39 MIL/uL (ref 4.22–5.81)
RDW: 16.1 % — AB (ref 11.5–15.5)
WBC: 6.9 10*3/uL (ref 4.0–10.5)

## 2016-09-13 LAB — COMPREHENSIVE METABOLIC PANEL
ALK PHOS: 42 U/L (ref 38–126)
ALT: 17 U/L (ref 17–63)
AST: 23 U/L (ref 15–41)
Albumin: 3.6 g/dL (ref 3.5–5.0)
Anion gap: 10 (ref 5–15)
BILIRUBIN TOTAL: 0.1 mg/dL — AB (ref 0.3–1.2)
BUN: 17 mg/dL (ref 6–20)
CALCIUM: 8.8 mg/dL — AB (ref 8.9–10.3)
CO2: 20 mmol/L — ABNORMAL LOW (ref 22–32)
Chloride: 112 mmol/L — ABNORMAL HIGH (ref 101–111)
Creatinine, Ser: 0.98 mg/dL (ref 0.61–1.24)
Glucose, Bld: 119 mg/dL — ABNORMAL HIGH (ref 65–99)
Potassium: 3.8 mmol/L (ref 3.5–5.1)
Sodium: 142 mmol/L (ref 135–145)
Total Protein: 6.6 g/dL (ref 6.5–8.1)

## 2016-09-13 LAB — RAPID URINE DRUG SCREEN, HOSP PERFORMED
AMPHETAMINES: NOT DETECTED
Barbiturates: NOT DETECTED
Benzodiazepines: NOT DETECTED
Cocaine: NOT DETECTED
OPIATES: NOT DETECTED
Tetrahydrocannabinol: POSITIVE — AB

## 2016-09-13 LAB — ETHANOL: ALCOHOL ETHYL (B): 144 mg/dL — AB (ref <5)

## 2016-09-13 NOTE — ED Provider Notes (Signed)
[facility]-EMERGENCY DEPT Provider Note   CSN: [id number] Arrival date & time: 09/13/16  2049     History   Chief Complaint Chief Complaint  Patient presents with  . Medical Clearance    HPI Terrance Cook is a 61 y.o. male.  HPI 61 year old male who presents as involuntary commitment. He has a history of schizoaffective disorder, major depressive disorder, hypertension, coronary artery disease, diabetes, prior CVA. Patient states that there is a new med tech today that was giving out his medications, and he is use to receiving his medications around 2 PM. He states that he asked the med tech if he could have his medications and was told that once she got to his name and he would administer it to him. He waited an hour and asked her again and was told the same thing. And then he waited another hour and asked them attack again for his medications and was told the same. He states that he subsequently became angry, and yelled at the nurse. States that he had no intention of harming the nurse but was just very frustrated. He denies any homicidal or suicidal thoughts. No alcohol or illicit drug use. No recent illnesses.  IVC paperwork filed against patient by retirement facility.    Past Medical History:  Diagnosis Date  . Anxiety 01/04/2015  . CAD (coronary artery disease) 01/04/2015  . Chronic pain 01/04/2015  . Coronary artery disease   . Diabetes mellitus without complication (HCC)   . H/O: CVA (cerebrovascular accident) 01/04/2015  . History of cardiac cath   . Hyperlipidemia 01/04/2015  . Hypertension   . MDD (major depressive disorder) 01/04/2015  . Schizoaffective disorder (HCC) 01/04/2015  . 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 Stroke (HCC)   . Tobacco use 01/04/2015    Patient Active Problem List   Diagnosis Date Noted  . Narcotic overdose 06/09/2016  . Slurred speech   . Leg edema, left   . Encounter for orogastric (OG) tube placement   . Acute on chronic respiratory failure  with hypoxemia (HCC)   . Encounter for intubation   . Chest pain   . Typical angina (HCC) 01/04/2015  . Hyperlipidemia 01/04/2015  . Tobacco use 01/04/2015  . MDD (major depressive disorder) 01/04/2015  . Anxiety 01/04/2015  . Encephalopathy 01/04/2015  . Schizoaffective disorder (HCC) 01/04/2015  . H/O: CVA (cerebrovascular accident) 01/04/2015  . Chronic pain 01/04/2015  . CAD (coronary artery disease) 01/04/2015  . Angina at [facility](HCC) 01/04/2015  . Postoperative anemia due to acute blood loss   . Femur fracture (HCC)   . Femur fracture, left (HCC) 08/14/2014  . Hip fracture (HCC) 08/14/2014  . Acute encephalopathy 08/14/2014  . Fall   . Left hemiparesis (HCC) 08/09/2014  . Diabetes mellitus type 2, controlled, without complications (HCC) 08/09/2014  . Essential hypertension 08/09/2014  . Acute respiratory failure with hypercapnia (HCC) 08/09/2014  . Altered mental status 08/08/2014    Past Surgical History:  Procedure Laterality Date  . CARDIAC SURGERY    . FEMUR FRACTURE SURGERY    . FEMUR IM NAIL Left 08/15/2014   Procedure: Operative Fixation left periprosthetic femur fracture;  Surgeon: Tarry KosNaiping M Xu, MD;  Location: [facility] OR;  Service: Orthopedics;  Laterality: Left;  . HARDWARE REMOVAL Left 08/15/2014   Procedure: HARDWARE REMOVAL;  Surgeon: Tarry KosNaiping M Xu, MD;  Location: [facility] OR;  Service: Orthopedics;  Laterality: Left;       Home Medications    Prior to Admission medications  Medication Sig Start Date End Date Taking? Authorizing Provider  amLODipine (NORVASC) 10 MG tablet Take 5 mg by mouth daily.     [provider]  aspirin 81 MG chewable tablet Chew 81 mg by mouth daily.    [provider]  atorvastatin (LIPITOR) 20 MG tablet Take 20 mg by mouth every evening.     [provider]  bisacodyl (DULCOLAX) 5 MG EC tablet Take 1 tablet (5 mg total) by mouth daily as needed for moderate constipation. Patient not taking: Reported on 06/04/2016  08/17/14   Rodolph Bong, MD  carvedilol (COREG) 3.125 MG tablet Take 1 tablet (3.125 mg total) by mouth 2 (two) times daily with a meal. 01/06/15   Vassie Loll, MD  clonazePAM (KLONOPIN) 0.5 MG tablet Take 0.25 mg by mouth 2 (two) times daily as needed for anxiety.    [provider]  cloNIDine (CATAPRES - DOSED IN MG/24 HR) 0.1 mg/24hr patch Place 1 patch (0.1 mg total) onto the skin once a week. 06/09/16   Rolly Salter, MD  diclofenac sodium (VOLTAREN) 1 % GEL Apply 2 g topically 2 (two) times daily. Apply to lower back, left hip, and left knee    [provider]  febuxostat (ULORIC) 40 MG tablet Take 40 mg by mouth daily.    [provider]  furosemide (LASIX) 20 MG tablet Take 40 mg by mouth daily.    [provider]  gabapentin (NEURONTIN) 300 MG capsule Take 300 mg by mouth 2 (two) times daily.    [provider]  ibuprofen (ADVIL,MOTRIN) 400 MG tablet Take 1 tablet (400 mg total) by mouth every 6 (six) hours as needed. Patient not taking: Reported on 06/22/2016 06/15/16   Horton, Mayer Masker, MD  ibuprofen (ADVIL,MOTRIN) 600 MG tablet Take 600 mg by mouth 3 (three) times daily.    [provider]  lamoTRIgine (LAMICTAL) 100 MG tablet Take 50 mg by mouth every evening.    [provider]  lamoTRIgine (LAMICTAL) 25 MG tablet Take 25 mg by mouth every morning.     [provider]  levETIRAcetam (KEPPRA) 1000 MG tablet Take 1,000 mg by mouth 2 (two) times daily.    [provider]  lisinopril (PRINIVIL,ZESTRIL) 20 MG tablet Take 40 mg by mouth daily.    [provider]  Melatonin 1 MG TABS Take 1 mg by mouth at bedtime.    [provider]  methocarbamol (ROBAXIN) 500 MG tablet Take 1 tablet (500 mg total) by mouth every 8 (eight) hours as needed for muscle spasms. 06/09/16   Rolly Salter, MD  nicotine (NICODERM CQ - DOSED IN MG/24 HOURS) 21 mg/24hr patch Place 1 patch (21 mg total) onto the  skin daily. Patient not taking: Reported on 06/04/2016 01/06/15   Vassie Loll, MD  nitroGLYCERIN (NITROSTAT) 0.4 MG SL tablet Place 0.4 mg under the tongue every 5 (five) minutes as needed for chest pain.    [provider]  pantoprazole (PROTONIX) 40 MG tablet Take 1 tablet (40 mg total) by mouth daily at 6 (six) AM. 01/06/15   Vassie Loll, MD  PARoxetine (PAXIL) 10 MG tablet Take 10 mg by mouth daily.    [provider]  QUEtiapine (SEROQUEL) 25 MG tablet Take 25 mg by mouth at bedtime.    [provider]  senna (SENOKOT) 8.6 MG TABS tablet Take 2 tablets by mouth daily.    [provider]  tamsulosin (FLOMAX) 0.4 MG CAPS  capsule Take 0.4 mg by mouth daily.    [provider]    Family History Family History  Problem Relation Age of Onset  . Heart attack Mother     Social History Social History  Substance Use Topics  . Smoking status: Current Every Day Smoker    Packs/day: 0.50    Years: 45.00    Types: Cigarettes  . Smokeless tobacco: Never Used  . Alcohol use No     Allergies   Patient has no known allergies.   Review of Systems Review of Systems  Constitutional: Negative for fever.  Respiratory: Negative for shortness of breath.   Cardiovascular: Negative for chest pain.  Gastrointestinal: Negative for abdominal pain.  Neurological: Negative for headaches.  Psychiatric/Behavioral: Negative for confusion.  All other systems reviewed and are negative.    Physical Exam Updated Vital Signs BP 106/72   Pulse 92   Temp 97.9 F (36.6 C) (Oral)   Resp 18   Ht 5\' 8"  (1.727 m)   Wt 195 lb (88.5 kg)   SpO2 98%   BMI 29.65 kg/m   Physical Exam Physical Exam  Nursing note and vitals reviewed. Constitutional: Well developed, well nourished, non-toxic, and in no acute distress Head: Normocephalic and atraumatic.  Mouth/Throat: Oropharynx is clear and moist.  Neck: Normal range of motion. Neck supple.  Cardiovascular:  Normal rate and regular rhythm.   Pulmonary/Chest: Effort normal and breath sounds normal.  Abdominal: Soft. There is no tenderness. There is no rebound and no guarding.  Musculoskeletal: Normal range of motion.  Neurological: Alert, no facial droop, fluent speech, moves all extremities symmetrically Skin: Skin is warm and dry.  Psychiatric: Cooperative   ED Treatments / Results  Labs (all labs ordered are listed, but only abnormal results are displayed) Labs Reviewed  COMPREHENSIVE METABOLIC PANEL - Abnormal; Notable for the following:       Result Value   Chloride 112 (*)    CO2 20 (*)    Glucose, Bld 119 (*)    Calcium 8.8 (*)    Total Bilirubin 0.1 (*)    All other components within normal limits  ETHANOL - Abnormal; Notable for the following:    Alcohol, Ethyl (B) 144 (*)    All other components within normal limits  CBC WITH DIFFERENTIAL/PLATELET - Abnormal; Notable for the following:    Hemoglobin 10.8 (*)    HCT 34.8 (*)    MCH 24.6 (*)    RDW 16.1 (*)    All other components within normal limits  RAPID URINE DRUG SCREEN, HOSP PERFORMED - Abnormal; Notable for the following:    Tetrahydrocannabinol POSITIVE (*)    All other components within normal limits    EKG  EKG Interpretation  Date/Time:  Sunday Sep 13 2016 21:38:01 EDT Ventricular Rate:  68 PR Interval:    QRS Duration: 90 QT Interval:  371 QTC Calculation: 395 R Axis:   60 Text Interpretation:  Sinus rhythm Posterior infarct, old Confirmed by LIU MD, DANA ([id number]) on 09/13/2016 11:13:36 PM       Radiology No results found.  Procedures Procedures (including critical care time)  Medications Ordered in ED Medications  acetaminophen (TYLENOL) tablet 650 mg (not administered)  amLODipine (NORVASC) tablet 5 mg (not administered)  aspirin chewable tablet 81 mg (not administered)  atorvastatin (LIPITOR) tablet 20 mg (not administered)  carvedilol (COREG) tablet 3.125 mg (not administered)    clonazePAM (KLONOPIN) tablet 0.25 mg (not administered)  febuxostat (ULORIC) tablet 40  mg (not administered)  furosemide (LASIX) tablet 40 mg (not administered)  gabapentin (NEURONTIN) capsule 300 mg (not administered)  ibuprofen (ADVIL,MOTRIN) tablet 600 mg (not administered)  lamoTRIgine (LAMICTAL) tablet 50 mg (not administered)  lamoTRIgine (LAMICTAL) tablet 25 mg (not administered)  levETIRAcetam (KEPPRA) tablet 1,000 mg (not administered)  lisinopril (PRINIVIL,ZESTRIL) tablet 40 mg (not administered)  Melatonin TABS 1 mg (not administered)  methocarbamol (ROBAXIN) tablet 500 mg (not administered)  nicotine (NICODERM CQ - dosed in mg/24 hours) patch 21 mg (not administered)  nitroGLYCERIN (NITROSTAT) SL tablet 0.4 mg (not administered)  pantoprazole (PROTONIX) EC tablet 40 mg (not administered)  PARoxetine (PAXIL) tablet 10 mg (not administered)  QUEtiapine (SEROQUEL) tablet 25 mg (not administered)  senna (SENOKOT) tablet 17.2 mg (not administered)  tamsulosin (FLOMAX) capsule 0.4 mg (not administered)     Initial Impression / Assessment and Plan / ED Course  I have reviewed the triage vital signs and the nursing notes.  Pertinent labs & imaging results that were available during my care of the patient were reviewed by me and considered in my medical decision making (see chart for details).     Presents with aggressive behavior. Seems more behavioral. Also there is alcohol on board, and could be substance use related issue as well. He has no medical complaints. Exam otherwise unremarkable. Vitals stable. Will consult TTS as medically cleared  TTS recommending AM re-evaluation. Psych hold orders placed.  Final Clinical Impressions(s) / ED Diagnoses   Final diagnoses:  Aggressive behavior    New Prescriptions New Prescriptions   No medications on file     Lavera Guise, MD 09/14/16 [phone]

## 2016-09-13 NOTE — ED Notes (Signed)
Bed: [facility] Expected date:  Expected time:  Means of arrival:  Comments: 

## 2016-09-13 NOTE — ED Notes (Signed)
Bed: [facility] Expected date:  Expected time:  Means of arrival:  Comments: IVC combative
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
## 2016-09-13 NOTE — BH Assessment (Addendum)
Tele Assessment Note   Terrance Cook is an 61 y.o. male who presents to the ED under IVC initiated by his group home administrator. Per IVC, the pt has been aggressive with staff and threatening other residents in the group home. Pt reportedly has a hx of depression, anxiety, and schizoaffective d/o. IVC states the pt is exhibiting manic behavior and has not been at baseline.   During the assessment the pt denied SI, HI, and AVH. Pt reports he became upset today when a medical tech did not give the pt his medication. Pt stated he was supposed to get his medication at 2pm and he still had not received it by 3pm. Pt reports he became upset and "cursed out the med tech."   Pt presents to be in a pleasant mood during the assessment and being polite and friendly with the assessor. Pt stated he has been IVC'd before when he gets angry at the nurses and curses at them and stated "but I was gone within a few hours the last time." Per chart, pt was seen at [facility] on 06/21/16 due to being aggressive and using marijuana at his retirement home. Pt denies any drug use at present.  Per Nira Conn, NP pt will need an AM psych eval. Lavera Guise, MD in agreement with disposition.   Diagnosis: hx of MDD, hx of Schizoaffective D/O, hx of Anxiety   Past Medical History:  Past Medical History:  Diagnosis Date  . Anxiety 01/04/2015  . CAD (coronary artery disease) 01/04/2015  . Chronic pain 01/04/2015  . Coronary artery disease   . Diabetes mellitus without complication (HCC)   . H/O: CVA (cerebrovascular accident) 01/04/2015  . History of cardiac cath   . Hyperlipidemia 01/04/2015  . Hypertension   . MDD (major depressive disorder) 01/04/2015  . Schizoaffective disorder (HCC) 01/04/2015  . 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 Stroke (HCC)   . Tobacco use 01/04/2015    Past Surgical History:  Procedure Laterality Date  . CARDIAC SURGERY    . FEMUR FRACTURE SURGERY    . FEMUR IM NAIL Left 08/15/2014    Procedure: Operative Fixation left periprosthetic femur fracture;  Surgeon: Tarry Kos, MD;  Location: [facility] OR;  Service: Orthopedics;  Laterality: Left;  . HARDWARE REMOVAL Left 08/15/2014   Procedure: HARDWARE REMOVAL;  Surgeon: Tarry Kos, MD;  Location: [facility] OR;  Service: Orthopedics;  Laterality: Left;    Family History:  Family History  Problem Relation Age of Onset  . Heart attack Mother     Social History:  reports that he has been smoking Cigarettes.  He has a 22.50 pack-year smoking history. He has never used smokeless tobacco. He reports that he does not drink alcohol or use drugs.  Additional Social History:  Alcohol / Drug Use Pain Medications: See PTA meds Prescriptions: See PTA meds Over the Counter: See PTA meds History of alcohol / drug use?: No history of alcohol / drug abuse  CIWA: CIWA-Ar BP: 106/72 Pulse Rate: 92 COWS:    PATIENT STRENGTHS: (choose at least two) Average or above average intelligence Communication skills Financial means  Allergies: No Known Allergies  Home Medications:  (Not in a hospital admission)  OB/GYN Status:  No LMP for male patient.  General Assessment Data Location of Assessment: [facility] ED TTS Assessment: In system Is this a Tele or Face-to-Face Assessment?: Face-to-Face Is this an Initial Assessment or a Re-assessment for this encounter?: Initial Assessment Marital status: Divorced Is  patient pregnant?: No Pregnancy Status: No Living Arrangements: Other (Comment) (retirement home) Can pt return to current living arrangement?: Yes Admission Status: Involuntary Is patient capable of signing voluntary admission?: No Referral Source: Self/Family/Friend Insurance type: Medicare     Crisis Care Plan Living Arrangements: Other (Comment) (retirement home) Name of Psychiatrist: none Name of Therapist: none  Education Status Is patient currently in school?: No Highest grade of school patient has completed: some  college  Risk to self with the past 6 months Suicidal Ideation: No Has patient been a risk to self within the past 6 months prior to admission? : No Suicidal Intent: No Has patient had any suicidal intent within the past 6 months prior to admission? : No Is patient at risk for suicide?: No Suicidal Plan?: No Has patient had any suicidal plan within the past 6 months prior to admission? : No Access to Means: No What has been your use of drugs/alcohol within the last 12 months?: denies Previous Attempts/Gestures: No Triggers for Past Attempts: None known Intentional Self Injurious Behavior: None Family Suicide History: No Recent stressful life event(s): Conflict (Comment) (w/ staff at retirement home) Persecutory voices/beliefs?: No Depression: No Depression Symptoms: Feeling angry/irritable Substance abuse history and/or treatment for substance abuse?: No Suicide prevention information given to non-admitted patients: Not applicable  Risk to Others within the past 6 months Homicidal Ideation: No Does patient have any lifetime risk of violence toward others beyond the six months prior to admission? : No Thoughts of Harm to Others: No Current Homicidal Intent: No Current Homicidal Plan: No Access to Homicidal Means: No History of harm to others?: No Assessment of Violence: None Noted Does patient have access to weapons?: No Criminal Charges Pending?: No Does patient have a court date: No Is patient on probation?: No  Psychosis Hallucinations: None noted Delusions: None noted  Mental Status Report Appearance/Hygiene: In scrubs, Revealing clothes/seductive clothing (shift half up and pt's stomach is exposed) Eye Contact: Good Motor Activity: Freedom of movement Speech: Logical/coherent Level of Consciousness: Alert Mood: Pleasant Affect: Appropriate to circumstance Anxiety Level: None Thought Processes: Coherent, Relevant Judgement: Unimpaired Orientation: Person, Place,  Time, Situation, Appropriate for developmental age Obsessive Compulsive Thoughts/Behaviors: None  Cognitive Functioning Concentration: Normal Memory: Recent Intact, Remote Intact IQ: Average Insight: Good Impulse Control: Good Appetite: Good Sleep: No Change Total Hours of Sleep: 8 Vegetative Symptoms: None  ADLScreening [facility] Assessment Services) Patient's cognitive ability adequate to safely complete daily activities?: Yes Patient able to express need for assistance with ADLs?: Yes Independently performs ADLs?: Yes (appropriate for developmental age)  Prior Inpatient Therapy Prior Inpatient Therapy: No  Prior Outpatient Therapy Prior Outpatient Therapy: No Does patient have an ACCT team?: No Does patient have Intensive In-House Services?  : No Does patient have [facility] services? : No Does patient have P4CC services?: No  ADL Screening (condition at time of admission) Patient's cognitive ability adequate to safely complete daily activities?: Yes Is the patient deaf or have difficulty hearing?: No Does the patient have difficulty seeing, even when wearing glasses/contacts?: No Does the patient have difficulty concentrating, remembering, or making decisions?: No Patient able to express need for assistance with ADLs?: Yes Does the patient have difficulty dressing or bathing?: No Independently performs ADLs?: Yes (appropriate for developmental age) Does the patient have difficulty walking or climbing stairs?: No Weakness of Legs: None Weakness of Arms/Hands: None  Home Assistive Devices/Equipment Home Assistive Devices/Equipment: None    Abuse/Neglect Assessment (Assessment to be complete while patient is alone)  Physical Abuse: Denies Verbal Abuse: Denies Sexual Abuse: Denies Exploitation of patient/patient's resources: Denies Self-Neglect: Denies     Merchant navy officerAdvance Directives (For Healthcare) Does Patient Have a Medical Advance Directive?: No Would patient like  information on creating a medical advance directive?: No - Patient declined    Additional Information 1:1 In Past 12 Months?: No CIRT Risk: No Elopement Risk: No Does patient have medical clearance?: Yes     Disposition:  Disposition Initial Assessment Completed for this Encounter: Yes Disposition of Patient: Other dispositions Other disposition(s): Other (Comment) (AM psych eval per Nira ConnJason Berry, NP)  Karolee OhsAquicha R Adonai Selsor 09/14/2016 12:23 AM

## 2016-09-14 DIAGNOSIS — F1014 Alcohol abuse with alcohol-induced mood disorder: Secondary | ICD-10-CM | POA: Diagnosis present

## 2016-09-14 DIAGNOSIS — F918 Other conduct disorders: Secondary | ICD-10-CM | POA: Diagnosis not present

## 2016-09-14 MED ORDER — AMLODIPINE BESYLATE 5 MG PO TABS
5.0000 mg | ORAL_TABLET | Freq: Every day | ORAL | Status: DC
  Administered 2016-09-14: 5 mg via ORAL
  Filled 2016-09-14: qty 1

## 2016-09-14 MED ORDER — FUROSEMIDE 40 MG PO TABS
40.0000 mg | ORAL_TABLET | Freq: Every day | ORAL | Status: DC
  Administered 2016-09-14: 40 mg via ORAL
  Filled 2016-09-14: qty 1

## 2016-09-14 MED ORDER — ATORVASTATIN CALCIUM 20 MG PO TABS: 20.0000 mg | ORAL_TABLET | Freq: Every evening | ORAL | Status: DC

## 2016-09-14 MED ORDER — PAROXETINE HCL 10 MG PO TABS: 10.0000 mg | ORAL_TABLET | Freq: Every day | ORAL | Status: DC

## 2016-09-14 MED ORDER — SENNA 8.6 MG PO TABS
2.0000 | ORAL_TABLET | Freq: Every day | ORAL | Status: DC
Start: 2016-09-14 — End: 2016-09-14
  Administered 2016-09-14: 17.2 mg via ORAL
  Filled 2016-09-14: qty 2

## 2016-09-14 MED ORDER — CLONAZEPAM 0.5 MG PO TABS: 0.2500 mg | ORAL_TABLET | Freq: Two times a day (BID) | ORAL | Status: DC | PRN

## 2016-09-14 MED ORDER — LEVETIRACETAM 500 MG PO TABS
1000.0000 mg | ORAL_TABLET | Freq: Two times a day (BID) | ORAL | Status: DC
  Administered 2016-09-14 (×2): 1000 mg via ORAL
  Filled 2016-09-14 (×2): qty 2

## 2016-09-14 MED ORDER — IBUPROFEN 200 MG PO TABS
600.0000 mg | ORAL_TABLET | Freq: Three times a day (TID) | ORAL | Status: DC
  Administered 2016-09-14: 600 mg via ORAL
  Filled 2016-09-14: qty 3

## 2016-09-14 MED ORDER — METHOCARBAMOL 500 MG PO TABS: 500.0000 mg | ORAL_TABLET | Freq: Three times a day (TID) | ORAL | Status: DC | PRN

## 2016-09-14 MED ORDER — MELATONIN 1 MG PO TABS: 1.0000 mg | ORAL_TABLET | Freq: Every day | ORAL | Status: DC

## 2016-09-14 MED ORDER — LAMOTRIGINE 25 MG PO TABS
25.0000 mg | ORAL_TABLET | Freq: Every morning | ORAL | Status: DC
  Administered 2016-09-14: 25 mg via ORAL
  Filled 2016-09-14: qty 1

## 2016-09-14 MED ORDER — TAMSULOSIN HCL 0.4 MG PO CAPS
0.4000 mg | ORAL_CAPSULE | Freq: Every day | ORAL | Status: DC
  Administered 2016-09-14: 0.4 mg via ORAL
  Filled 2016-09-14: qty 1

## 2016-09-14 MED ORDER — CARVEDILOL 3.125 MG PO TABS
3.1250 mg | ORAL_TABLET | Freq: Two times a day (BID) | ORAL | Status: DC
  Administered 2016-09-14: 3.125 mg via ORAL
  Filled 2016-09-14: qty 1

## 2016-09-14 MED ORDER — GABAPENTIN 300 MG PO CAPS
300.0000 mg | ORAL_CAPSULE | Freq: Two times a day (BID) | ORAL | Status: DC
  Administered 2016-09-14 (×2): 300 mg via ORAL
  Filled 2016-09-14 (×2): qty 1

## 2016-09-14 MED ORDER — NITROGLYCERIN 0.4 MG SL SUBL
0.4000 mg | SUBLINGUAL_TABLET | SUBLINGUAL | Status: DC | PRN
Start: 2016-09-14 — End: 2016-09-14

## 2016-09-14 MED ORDER — QUETIAPINE FUMARATE 25 MG PO TABS
25.0000 mg | ORAL_TABLET | Freq: Every day | ORAL | Status: DC
  Administered 2016-09-14: 25 mg via ORAL
  Filled 2016-09-14: qty 1

## 2016-09-14 MED ORDER — FEBUXOSTAT 40 MG PO TABS
40.0000 mg | ORAL_TABLET | Freq: Every day | ORAL | Status: DC
  Administered 2016-09-14: 40 mg via ORAL
  Filled 2016-09-14: qty 1

## 2016-09-14 MED ORDER — ASPIRIN 81 MG PO CHEW
81.0000 mg | CHEWABLE_TABLET | Freq: Every day | ORAL | Status: DC
  Administered 2016-09-14: 81 mg via ORAL
  Filled 2016-09-14: qty 1

## 2016-09-14 MED ORDER — LAMOTRIGINE 25 MG PO TABS: 50.0000 mg | ORAL_TABLET | Freq: Every evening | ORAL | Status: DC

## 2016-09-14 MED ORDER — LISINOPRIL 20 MG PO TABS
40.0000 mg | ORAL_TABLET | Freq: Every day | ORAL | Status: DC
  Administered 2016-09-14: 40 mg via ORAL
  Filled 2016-09-14: qty 2

## 2016-09-14 MED ORDER — ACETAMINOPHEN 325 MG PO TABS: 650.0000 mg | ORAL_TABLET | ORAL | Status: DC | PRN

## 2016-09-14 MED ORDER — PANTOPRAZOLE SODIUM 40 MG PO TBEC
40.0000 mg | DELAYED_RELEASE_TABLET | Freq: Every day | ORAL | Status: DC
  Administered 2016-09-14: 40 mg via ORAL
  Filled 2016-09-14: qty 1

## 2016-09-14 MED ORDER — NICOTINE 21 MG/24HR TD PT24
21.0000 mg | MEDICATED_PATCH | Freq: Every day | TRANSDERMAL | Status: DC
  Administered 2016-09-14: 21 mg via TRANSDERMAL
  Filled 2016-09-14: qty 1

## 2016-09-14 MED ORDER — LORAZEPAM 1 MG PO TABS
1.0000 mg | ORAL_TABLET | Freq: Three times a day (TID) | ORAL | Status: DC | PRN
Start: 2016-09-14 — End: 2016-09-14

## 2016-09-14 NOTE — ED Notes (Signed)
Pt used restroom and changed scrubs, Pt now resting

## 2016-09-14 NOTE — Progress Notes (Signed)
CSW spoke with representative from [facility]. Patient is able to return today and will need AVS. Representative stated [facility] filed a 30-day notice which the end of the 30-day notice was May 10th. [facility] still agreeable to take patient at this time.   Stacy GardnerErin Nyelle Wolfson, [name] Clinical Social Worker [phone]

## 2016-09-14 NOTE — Progress Notes (Signed)
PHARMACIST - PHYSICIAN ORDER COMMUNICATION  CONCERNING: P&T Medication Policy on Herbal Medications  DESCRIPTION:  This patient's order for:  Melatonin  has been noted.  This product(s) is classified as an "herbal" or natural product. Due to a lack of definitive safety studies or FDA approval, nonstandard manufacturing practices, plus the potential risk of unknown drug-drug interactions while on inpatient medications, the Pharmacy and Therapeutics Committee does not permit the use of "herbal" or natural products of this type within [facility].   ACTION TAKEN: The pharmacy department is unable to verify this order at this time and your patient has been informed of this safety policy. Please reevaluate patient's clinical condition at discharge and address if the herbal or natural product(s) should be resumed at that time.   Thanks Lorenza EvangelistGreen, Kishawn Pickar R 09/14/2016 1:58 AM

## 2016-09-14 NOTE — ED Notes (Signed)
PTAR at bedside at this time.  

## 2016-09-14 NOTE — [facility] Suicide Risk Assessment (Signed)
Suicide Risk Assessment  Discharge Assessment   [facility]   Principal Problem: Alcohol abuse with alcohol-induced mood disorder [facility](HCC) Discharge Diagnoses:  Patient Active Problem List   Diagnosis Date Noted  . Alcohol abuse with alcohol-induced mood disorder (HCC) [F10.14] 09/14/2016    Priority: High  . Narcotic overdose [T40.601A] 06/09/2016  . Slurred speech [R47.81]   . Leg edema, left [R60.0]   . Encounter for orogastric (OG) tube placement [Z46.59]   . Acute on chronic respiratory failure with hypoxemia (HCC) [J96.21]   . Encounter for intubation [Z01.818]   . Chest pain [R07.9]   . Typical angina (HCC) [I20.9] 01/04/2015  . Hyperlipidemia [E78.5] 01/04/2015  . Tobacco use [Z72.0] 01/04/2015  . MDD (major depressive disorder) [F32.9] 01/04/2015  . Anxiety [F41.9] 01/04/2015  . Encephalopathy [G93.40] 01/04/2015  . Schizoaffective disorder (HCC) [F25.9] 01/04/2015  . H/O: CVA (cerebrovascular accident) [Z86.73] 01/04/2015  . Chronic pain [G89.29] 01/04/2015  . CAD (coronary artery disease) [I25.10] 01/04/2015  . Angina at rest [facility](HCC) [I20.8] 01/04/2015  . Postoperative anemia due to acute blood loss [D62]   . Femur fracture (HCC) [S72.90XA]   . Femur fracture, left (HCC) [S72.92XA] 08/14/2014  . Hip fracture (HCC) [S72.009A] 08/14/2014  . Acute encephalopathy [G93.40] 08/14/2014  . Fall [W19.XXXA]   . Left hemiparesis (HCC) [G81.94] 08/09/2014  . Diabetes mellitus type 2, controlled, without complications (HCC) [E11.9] 08/09/2014  . Essential hypertension [I10] 08/09/2014  . Acute respiratory failure with hypercapnia (HCC) [J96.02] 08/09/2014  . Altered mental status [R41.82] 08/08/2014    Total Time spent with patient: 45 minutes  Musculoskeletal: Strength & Muscle Tone: within normal limits Gait & Station: normal Patient leans: N/A  Psychiatric Specialty Exam:   Blood pressure (!) 180/85, pulse 72, temperature 97.6 F (36.4 C),  temperature source Oral, resp. rate 17, height 5\' 8"  (1.727 m), weight 88.5 kg (195 lb), SpO2 97 %.Body mass index is 29.65 kg/m.  General Appearance: Casual  Eye Contact::  Good  Speech:  Normal Rate409  Volume:  Normal  Mood:  Euthymic  Affect:  Congruent  Thought Process:  Coherent and Descriptions of Associations: Intact  Orientation:  Full (Time, Place, and Person)  Thought Content:  WDL and Logical  Suicidal Thoughts:  No  Homicidal Thoughts:  No  Memory:  Immediate;   Good Recent;   Good Remote;   Good  Judgement:  Fair  Insight:  Fair  Psychomotor Activity:  Normal  Concentration:  Good  Recall:  Good  Fund of Knowledge:Good  Language: Good  Akathisia:  No  Handed:  Right  AIMS (if indicated):     Assets:  Housing Leisure Time Resilience  Sleep:     Cognition: WNL  ADL's:  Intact   Mental Status Per Nursing Assessment::   On Admission:   altercation at his nursing facility.  He got upset that the medication tech was taking so long to give him his medicine.  Terrance Cook is calm and cooperative.  Reports he asked for his 1400 medications at 1500 and did not receive them, some were for pain.  He asked again closer to summer and the tech was short with him and he accused of not giving "a shit about us" as it was her last day on the job, did not seem to care.  He regrets he did this and feels his "couple of beers" lowered his inhibitions.  Does not feel drinking is an issue for him, no withdrawal symptoms when not drinking.  No suicidal/homicidal ideations, hallucinations, or withdrawal symptoms.  Stable to return to his facility.  Demographic Factors:  Male  Loss Factors: NA  Historical Factors: NA  Risk Reduction Factors:   Sense of responsibility to family, Living with another person, especially a relative and Positive social support  Continued Clinical Symptoms:  None  Cognitive Features That Contribute To Risk:  None    Suicide Risk:  Minimal: No identifiable  suicidal ideation.  Patients presenting with no risk factors but with morbid ruminations; may be classified as minimal risk based on the severity of the depressive symptoms    Plan Of Care/Follow-up recommendations:  Activity:  as tolerated Diet:  heart healthy diet  Leydi Winstead, NP 09/14/2016, 10:19 AM

## 2017-02-08 IMAGING — CT CT HEAD W/O CM
1 series · 16 of 30 positions shown, 20 images · non-contrast
Comparison: None.

CLINICAL DATA: Severe right-sided headache worsening throughout the
day.

EXAM:
CT HEAD WITHOUT CONTRAST
TECHNIQUE: Contiguous axial images were obtained from the base of the skull
through the vertex without intravenous contrast.

[Series 2: head 5.0 h30s · axial · 0.47mm/px · z∈[-245,-80]mm · 16 of 37 slices shown, 20 images]
[im 2/37  brain]
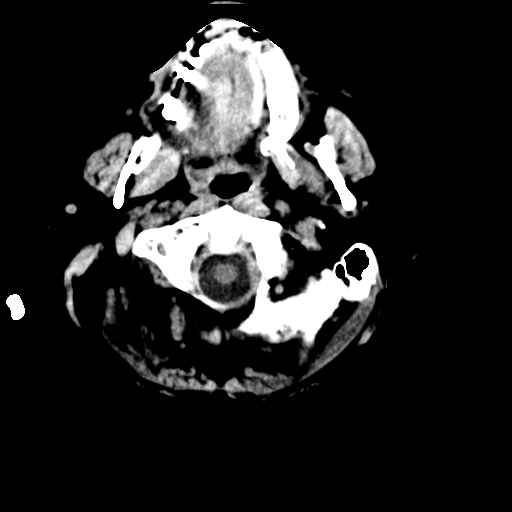
[im 2/37  bone]
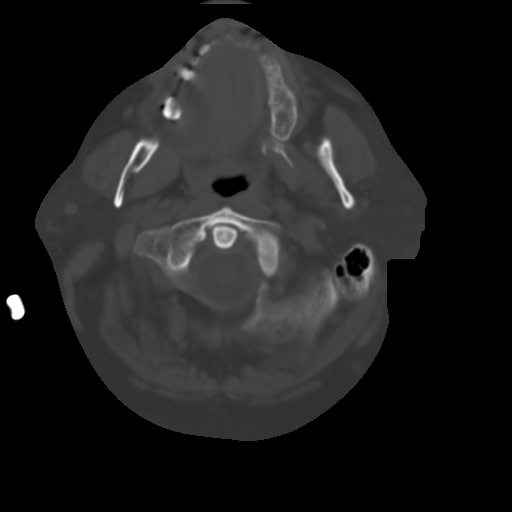
[im 4/37  brain]
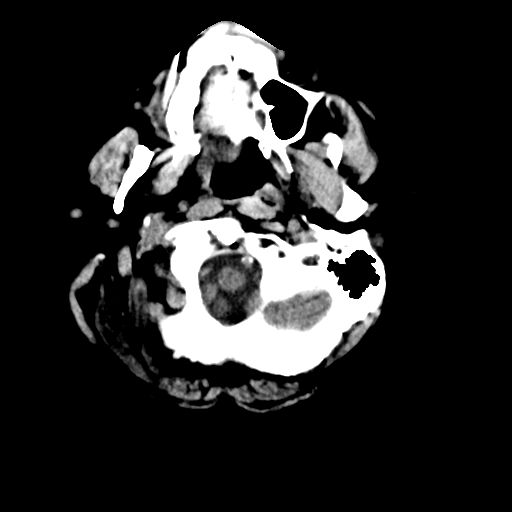
[im 7/37  brain]
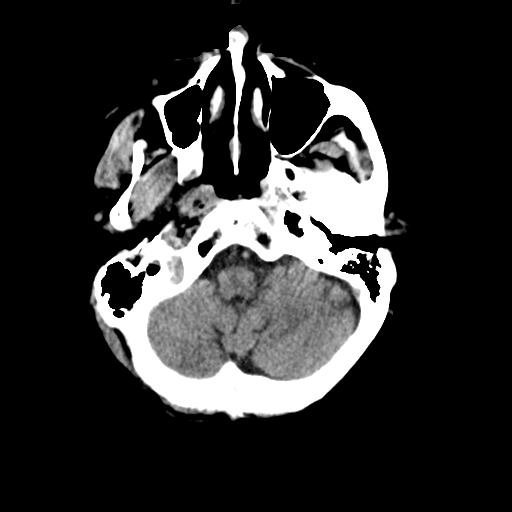
[im 9/37  brain]
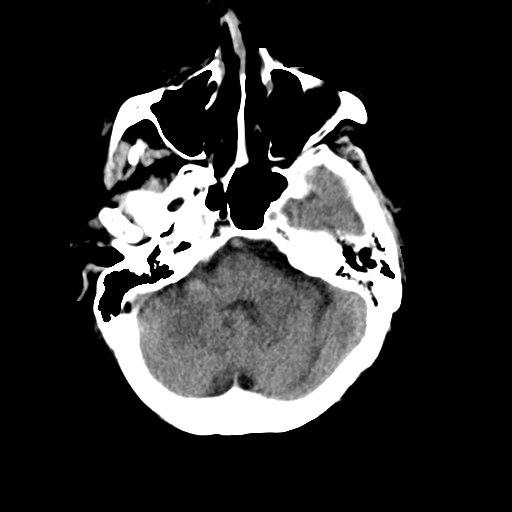
[im 10/37  brain]
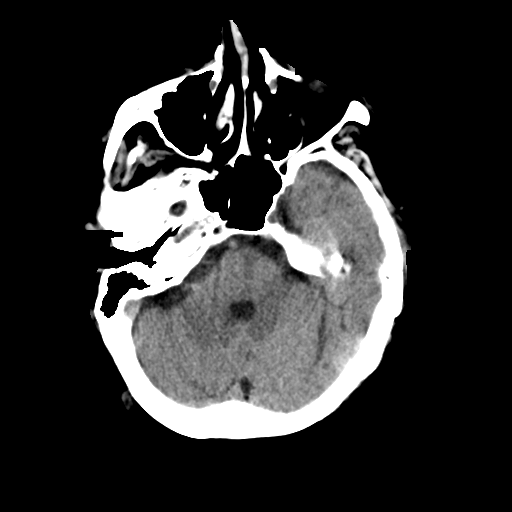
[im 10/37  bone]
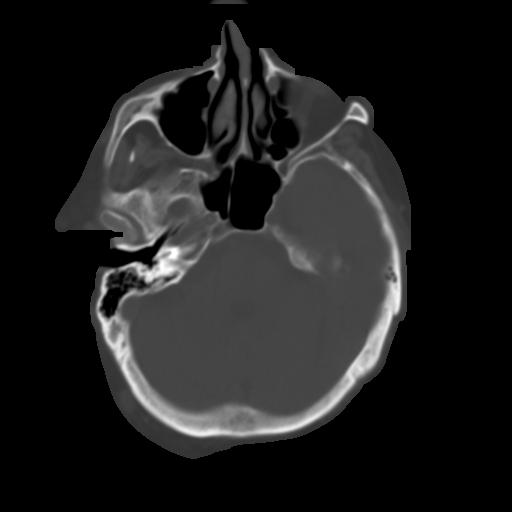
[im 13/37  brain]
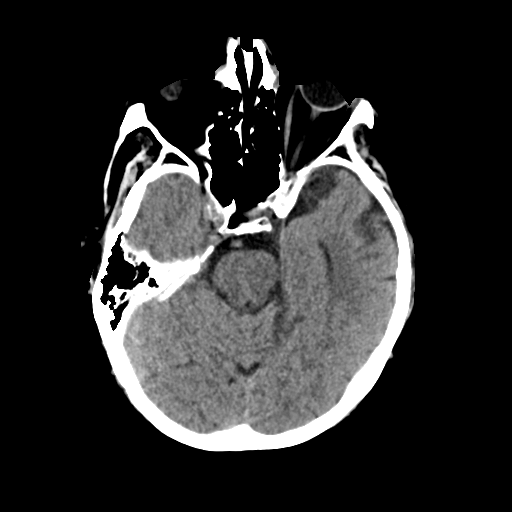
[im 15/37  brain]
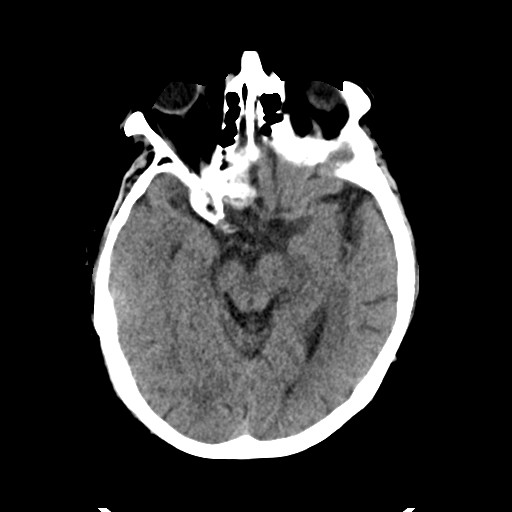
[im 18/37  brain]
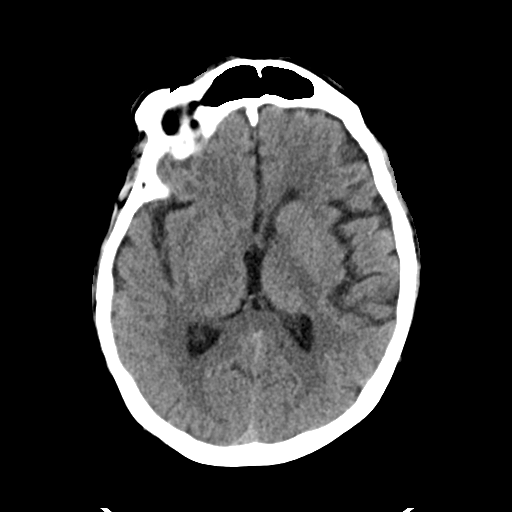
[im 19/37  brain]
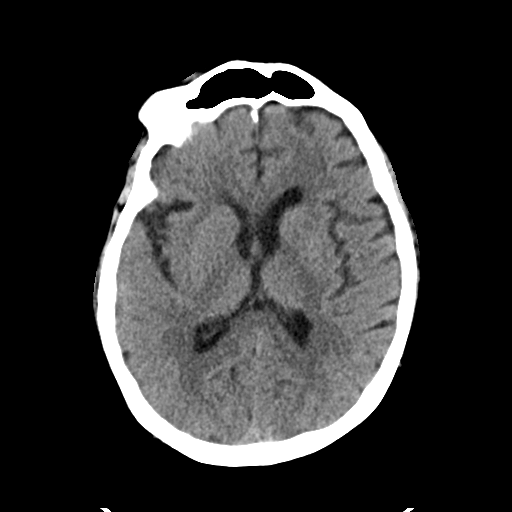
[im 19/37  bone]
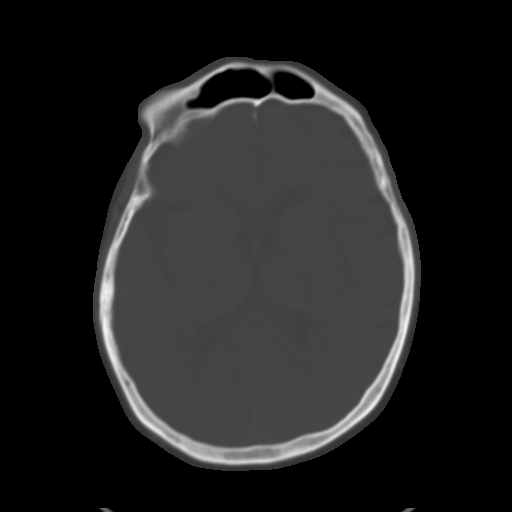
[im 22/37  brain]
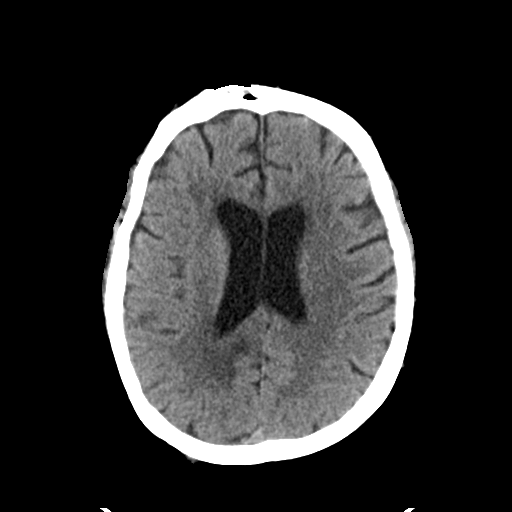
[im 24/37  brain]
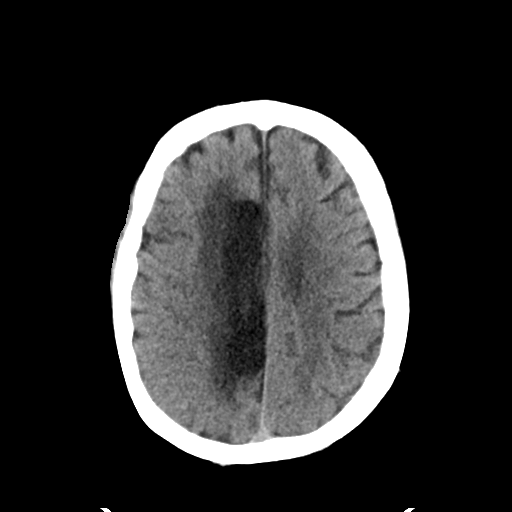
[im 27/37  brain]
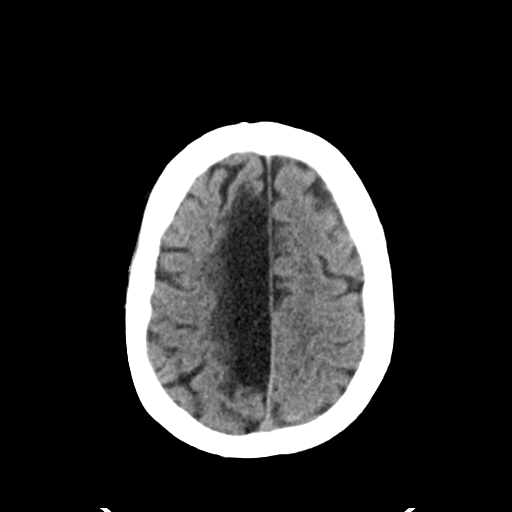
[im 28/37  brain]
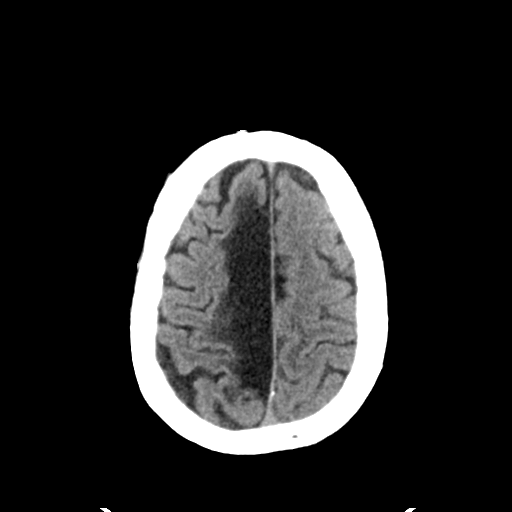
[im 28/37  bone]
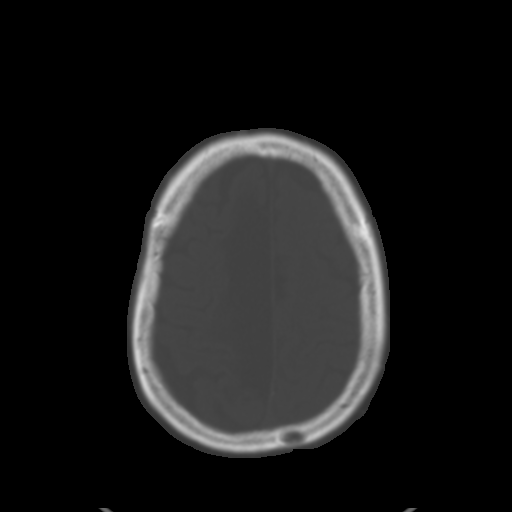
[im 30/37  brain]
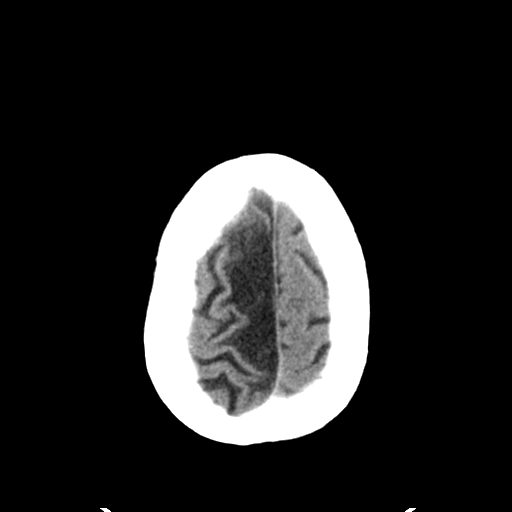
[im 33/37  brain]
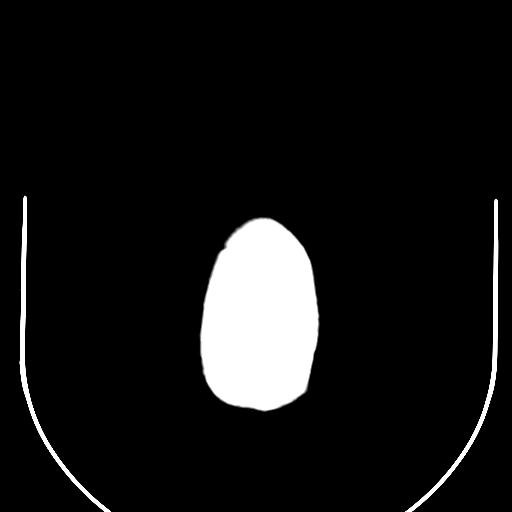
[im 35/37  brain]
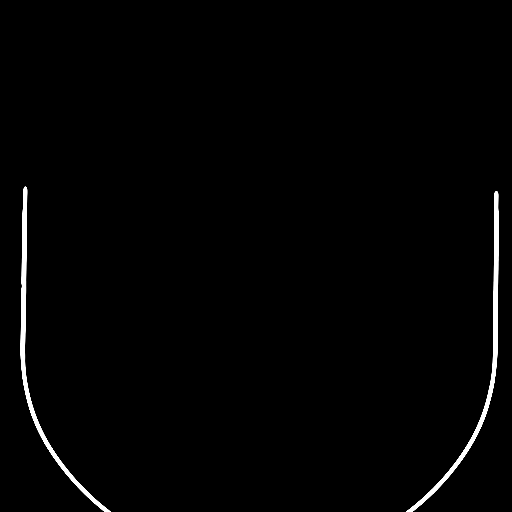

[16 of 30 positions shown; findings below may reference images not displayed]

FINDINGS: Encephalomalacia in the high parasagittal right frontal lobe is
consistent with a chronic ACA territory infarct. There is ex vacuo
dilatation of the right lateral ventricle. Periventricular
white-matter hypodensities are nonspecific but compatible with mild
chronic small vessel ischemic disease. There is no evidence of acute
cortical infarct, intracranial hemorrhage, mass, midline shift, or
extra-axial fluid collection.

Orbits are unremarkable. Mastoid air cells and paranasal sinuses are
clear. Mild carotid siphon calcification is noted.
IMPRESSION: 1. No evidence of acute intracranial abnormality.
2. Chronic right frontal lobe ACA territory infarct.

## 2018-11-26 DEATH — deceased
